# Patient Record
Sex: Female | Born: 1945
Health system: Southern US, Community
[De-identification: ages and names within clinical notes are randomized; demographics above are authoritative.]

## PROBLEM LIST (undated history)

## (undated) ENCOUNTER — Ambulatory Visit: Admission: EM | Payer: PPO | Source: Home / Self Care

## (undated) DIAGNOSIS — T7840XA Allergy, unspecified, initial encounter: Secondary | ICD-10-CM

## (undated) DIAGNOSIS — E7800 Pure hypercholesterolemia, unspecified: Secondary | ICD-10-CM

## (undated) DIAGNOSIS — F419 Anxiety disorder, unspecified: Secondary | ICD-10-CM

## (undated) DIAGNOSIS — J45909 Unspecified asthma, uncomplicated: Secondary | ICD-10-CM

## (undated) DIAGNOSIS — M1990 Unspecified osteoarthritis, unspecified site: Secondary | ICD-10-CM

## (undated) DIAGNOSIS — K219 Gastro-esophageal reflux disease without esophagitis: Secondary | ICD-10-CM

## (undated) DIAGNOSIS — F32A Depression, unspecified: Secondary | ICD-10-CM

## (undated) DIAGNOSIS — Z972 Presence of dental prosthetic device (complete) (partial): Secondary | ICD-10-CM

## (undated) DIAGNOSIS — Z8669 Personal history of other diseases of the nervous system and sense organs: Secondary | ICD-10-CM

## (undated) DIAGNOSIS — E119 Type 2 diabetes mellitus without complications: Secondary | ICD-10-CM

## (undated) DIAGNOSIS — R42 Dizziness and giddiness: Secondary | ICD-10-CM

## (undated) DIAGNOSIS — N6452 Nipple discharge: Secondary | ICD-10-CM

## (undated) DIAGNOSIS — F329 Major depressive disorder, single episode, unspecified: Secondary | ICD-10-CM

## (undated) DIAGNOSIS — N6019 Diffuse cystic mastopathy of unspecified breast: Secondary | ICD-10-CM

## (undated) DIAGNOSIS — E785 Hyperlipidemia, unspecified: Secondary | ICD-10-CM

## (undated) DIAGNOSIS — E559 Vitamin D deficiency, unspecified: Secondary | ICD-10-CM

## (undated) HISTORY — PX: TUBAL LIGATION: SHX77

## (undated) HISTORY — DX: Personal history of other diseases of the nervous system and sense organs: Z86.69

## (undated) HISTORY — DX: Type 2 diabetes mellitus without complications: E11.9

## (undated) HISTORY — DX: Unspecified osteoarthritis, unspecified site: M19.90

## (undated) HISTORY — DX: Depression, unspecified: F32.A

## (undated) HISTORY — DX: Nipple discharge: N64.52

## (undated) HISTORY — DX: Vitamin D deficiency, unspecified: E55.9

## (undated) HISTORY — DX: Gastro-esophageal reflux disease without esophagitis: K21.9

## (undated) HISTORY — PX: BREAST EXCISIONAL BIOPSY: SUR124

## (undated) HISTORY — PX: CYST EXCISION: SHX5701

## (undated) HISTORY — PX: TONSILLECTOMY: SUR1361

## (undated) HISTORY — DX: Hyperlipidemia, unspecified: E78.5

## (undated) HISTORY — DX: Unspecified asthma, uncomplicated: J45.909

## (undated) HISTORY — DX: Allergy, unspecified, initial encounter: T78.40XA

## (undated) HISTORY — DX: Pure hypercholesterolemia, unspecified: E78.00

## (undated) HISTORY — DX: Major depressive disorder, single episode, unspecified: F32.9

## (undated) HISTORY — DX: Anxiety disorder, unspecified: F41.9

## (undated) HISTORY — DX: Diffuse cystic mastopathy of unspecified breast: N60.19

## (undated) SURGERY — EXCISION OF BREAST LESION
Anesthesia: Choice | Laterality: Left

---

## 1993-09-15 HISTORY — PX: LIPOMA EXCISION: SHX5283

## 2004-12-09 ENCOUNTER — Ambulatory Visit: Payer: Self-pay | Admitting: Family Medicine

## 2009-09-15 HISTORY — PX: LYMPH GLAND EXCISION: SHX13

## 2009-09-15 HISTORY — PX: BREAST BIOPSY: SHX20

## 2010-06-20 ENCOUNTER — Ambulatory Visit: Payer: Self-pay | Admitting: Family Medicine

## 2010-07-03 ENCOUNTER — Ambulatory Visit: Payer: Self-pay | Admitting: Family Medicine

## 2010-07-29 ENCOUNTER — Ambulatory Visit: Payer: Self-pay | Admitting: Surgery

## 2011-03-05 ENCOUNTER — Ambulatory Visit: Payer: Self-pay | Admitting: Surgery

## 2012-06-01 ENCOUNTER — Ambulatory Visit: Payer: Self-pay | Admitting: Family Medicine

## 2012-06-14 ENCOUNTER — Ambulatory Visit: Payer: Self-pay | Admitting: Otolaryngology

## 2012-06-14 LAB — CREATININE, SERUM: EGFR (African American): >60

## 2012-07-12 ENCOUNTER — Ambulatory Visit: Payer: Self-pay | Admitting: Otolaryngology

## 2012-07-13 LAB — PATHOLOGY REPORT

## 2013-03-14 ENCOUNTER — Ambulatory Visit: Payer: Self-pay | Admitting: Family Medicine

## 2014-01-19 DIAGNOSIS — F419 Anxiety disorder, unspecified: Secondary | ICD-10-CM | POA: Insufficient documentation

## 2014-01-19 DIAGNOSIS — E559 Vitamin D deficiency, unspecified: Secondary | ICD-10-CM | POA: Insufficient documentation

## 2014-03-21 ENCOUNTER — Ambulatory Visit: Payer: Self-pay | Admitting: Emergency Medicine

## 2014-05-30 DIAGNOSIS — F32A Depression, unspecified: Secondary | ICD-10-CM | POA: Insufficient documentation

## 2014-05-30 DIAGNOSIS — F329 Major depressive disorder, single episode, unspecified: Secondary | ICD-10-CM | POA: Insufficient documentation

## 2015-01-02 NOTE — Op Note (Signed)
PATIENT NAME:  Diane Anderson, Diane Anderson MR#:  [id number] DATE OF BIRTH:  01-Dec-1945  DATE OF PROCEDURE:  07/12/2012  PREOPERATIVE DIAGNOSIS: Left parotid tumor.   POSTOPERATIVE DIAGNOSIS: Left parotid tumor.   OPERATIVE PROCEDURES:  1. Left superficial parotidectomy with excision of tumor.  2. Advancement flap closure of the defect for eliminating the dead space.   ANESTHESIA: General.   SURGEON: Huey Romans, MD   ASSISTANT: Beverly Gust, MD   COMPLICATIONS: None.   TOTAL ESTIMATED BLOOD LOSS: 25 mL.   PROCEDURE: The patient was given general anesthesia by oral endotracheal intubation. Once she was fully asleep, she was rotated to the right with shoulder roll for better visualization of the area. Facial nerve monitoring probe was placed around the left periorbital area and then in the left perioral area. This was used to continuously monitor the facial nerves. She was prepped and draped in a sterile fashion. Skin marking was created in the left preauricular crease, carried down around the earlobe and then posteriorly onto the neck into a neck crease. It was carried fairly far posteriorly because the mass was in the posterior portion of the parotid. You could feel the mass in the neck. 7.5 mL of 1% Xylocaine with epinephrine 1:100,000 were used for infiltration under the skin on the left side of the face and anterior neck. She was prepped and draped in a sterile fashion with a plastic drape over the face to be able to watch the facial muscles. The incision was created with elevation of a skin flap overlying the superficial parotid fascia. This brought the skin forward and posterior flap was created as well. The sternocleidomastoid muscles were evident and the tail of the parotid was freed up from the sternocleidomastoid muscle and this was tracked down inferiorly. You could see the posterior edge of the tumor and care was taken to make sure the capsule was all left intact. Once this was freed  up posteriorly, the parotid was pushed forward and dissection was carried down along the tragal cartilage to find the pointer. The tympanomastoid suture line was inferior to that and the nerve was found running inferiorly and fairly low from the tympanomastoid suture line. This was stimulated to make sure we could see good movement of the face and clearly identify the nerve. There was a branch that went superiorly and because of the location of the tumor dissection was not carried out superiorly around the nerve as this was unnecessary. Where it branched, the dissection was carried up to the parotid fascia and separating the superficial lobe of the lower half of the parotid gland. Dissection was carried along the inferior branches of the facial nerve as this ran deep to the tumor. The tumor was elevated and rotated superiorly. It was freed up from the digastric and then from the lower branches of the facial nerve. The remaining parotid fascia inferiorly and anterior to the lesion was then cut across to free this up here. There was a large retrofacial vein that was found. This was left intact and the lower branches of the facial nerve were coursing directly over the top of this. The entire mass was then finally freed up and removed for permanent section. This was the lower portion of the superficial lobe of the parotid gland along with the tumor. Wound was copiously irrigated. There was no significant bleeding here at all. It was extremely dry.   Advancement flap closure was then done by using 4-0 Mersilene to suture the parotid  fascia back to the fascia overlying the sternocleidomastoid. A 10 French TLS drain was then placed through a separate inferior stab incision and left deep in the wound. The Mersilene was then used to close over the rest of the defect. This advanced the skin overlying the parotid fascia and approximately 3/4 inch of skin was removed. The skin edges were then closed using 4-0 Vicryl  interrupted sutures and this closed the dermis along the length of the wound. Skin edges were held in apposition with a 6-0 nylon running locking suture. This advanced the skin backward and removed excessive skin to close the wound and prevent a defect in the area.   The patient tolerated the procedure well. She was awakened and taken to the recovery room in satisfactory condition. There were no operative complications. Total estimated blood loss was 25 mL.   ____________________________ Huey Romans, MD phj:drc D: 07/12/2012 11:12:29 ET T: 07/12/2012 11:27:47 ET JOB#: [id number]  cc: Huey Romans, MD, <Dictator> Huey Romans MD ELECTRONICALLY SIGNED 07/12/2012 18:22

## 2015-08-21 DIAGNOSIS — F3341 Major depressive disorder, recurrent, in partial remission: Secondary | ICD-10-CM | POA: Insufficient documentation

## 2015-08-21 DIAGNOSIS — E119 Type 2 diabetes mellitus without complications: Secondary | ICD-10-CM | POA: Insufficient documentation

## 2015-11-13 ENCOUNTER — Other Ambulatory Visit: Payer: Self-pay | Admitting: Family Medicine

## 2015-11-13 DIAGNOSIS — Z1231 Encounter for screening mammogram for malignant neoplasm of breast: Secondary | ICD-10-CM

## 2016-02-21 ENCOUNTER — Other Ambulatory Visit: Payer: Self-pay | Admitting: *Deleted

## 2016-02-21 ENCOUNTER — Inpatient Hospital Stay
Admission: RE | Admit: 2016-02-21 | Discharge: 2016-02-21 | Disposition: A | Discharge status: Home / Self Care | Payer: Self-pay | Source: Ambulatory Visit | Attending: *Deleted | Admitting: *Deleted

## 2016-02-21 DIAGNOSIS — Z1231 Encounter for screening mammogram for malignant neoplasm of breast: Secondary | ICD-10-CM

## 2016-03-06 ENCOUNTER — Other Ambulatory Visit: Payer: Self-pay | Admitting: Family Medicine

## 2016-03-06 ENCOUNTER — Ambulatory Visit
Admission: RE | Admit: 2016-03-06 | Discharge: 2016-03-06 | Disposition: A | Discharge status: Home / Self Care | Payer: Medicare Other | Source: Ambulatory Visit | Attending: Family Medicine | Admitting: Family Medicine

## 2016-03-06 DIAGNOSIS — Z1231 Encounter for screening mammogram for malignant neoplasm of breast: Secondary | ICD-10-CM

## 2016-03-13 ENCOUNTER — Other Ambulatory Visit: Payer: Self-pay

## 2016-03-13 ENCOUNTER — Ambulatory Visit (INDEPENDENT_AMBULATORY_CARE_PROVIDER_SITE_OTHER): Payer: Medicare Other | Admitting: Surgery

## 2016-03-13 ENCOUNTER — Encounter: Payer: Self-pay | Admitting: Surgery

## 2016-03-13 VITALS — BP 125/69 | HR 80 | Temp 98.1°F | Ht 63.0 in | Wt 181.0 lb

## 2016-03-13 DIAGNOSIS — K219 Gastro-esophageal reflux disease without esophagitis: Secondary | ICD-10-CM | POA: Insufficient documentation

## 2016-03-13 DIAGNOSIS — N6452 Nipple discharge: Secondary | ICD-10-CM | POA: Diagnosis not present

## 2016-03-13 DIAGNOSIS — E7800 Pure hypercholesterolemia, unspecified: Secondary | ICD-10-CM | POA: Insufficient documentation

## 2016-03-13 NOTE — Patient Instructions (Addendum)
Your appointment at [facility] will be on 03/27/2016 at 10:00 AM.   Your appointment with Dr. Azalee Course will be on 03/27/2016 at 2:00 PM at our [facility] office.

## 2016-03-17 NOTE — Progress Notes (Signed)
Subjective:     Patient ID: Diane Anderson, female   DOB: 20-Aug-1946, 70 y.o.   MRN: [id number]  HPI  70 yr old female who comes in today with complaint to left breast nipple discharge.  Unfortunately she is a poor historian and I cannot get an exact answer of the length of time that she's noticed it there somewhere on the timeframe of about a year. Patient has had previous drainage from the breast which patient states was the left breast and that she had some of the discharge sent for exam with my partner Dr. Felton Clinton. Patient also states that she had part of this area removed previously however when questioned further cannot remember if it's 2011 or 2014.  I believe she initially had drainage out of the right breast back in 2011 and had a biopsy of this area which showed fibrocystic changes. I believe that her left breast started draining in 2014 and she did not have a excision at that time but did have some of the drainage sent for cytology which was inconclusive and Dr. Marina Gravel believed was a dilated vein per his note.  Patient did seem to indicate that it had continued to drain occasionally since 2014.  She states that the initial time it was bloody and she thought that she had hit it doing outside yardwork the patient states that now it is a yellowish color. Patient states that the first time she noticed it was after taking a shower and lifting her breast up had a sticky substance on her hands. Patient denies any new lumps or bumps in the breast or in the axilla. She denies any pain or other symptoms associated.  She does not have a personal history of breast cancer and states maternal aunt with breast cancer as well, her mother had some skin cancers on her leg.    Past Medical History  Diagnosis Date  . Arthritis     left knee, left shoulder  . Hyperlipidemia   . GERD (gastroesophageal reflux disease)   . Anxiety   . Asthma   . Diabetes mellitus without complication [facility])    Past Surgical  History  Procedure Laterality Date  . Cyst excision      Dr. Pat Patrick  . Lipoma excision  1995    Right Shoulder  . Lymph gland excision Right 2011   Family History  Problem Relation Age of Onset  . Dementia Mother   . Cancer Mother     skin  . Alzheimer's disease Father   . Breast cancer Maternal Aunt 62  . Birth defects Cousin 18    Breast   Social History   Social History  . Marital Status: Divorced    Spouse Name: N/A  . Number of Children: N/A  . Years of Education: N/A   Social History Main Topics  . Smoking status: Current Some Day Smoker -- 1.00 packs/day for 52 years    Types: Cigarettes  . Smokeless tobacco: Never Used  . Alcohol Use: 0.0 oz/week    0 Standard drinks or equivalent per week     Comment: rare  . Drug Use: No  . Sexual Activity: Not Asked   Other Topics Concern  . None   Social History Narrative  . None    Current outpatient prescriptions:  .  albuterol (VENTOLIN HFA) 108 (90 Base) MCG/ACT inhaler, Inhale 2 Inhalers into the lungs every 4 (four) hours as needed., Disp: , Rfl:  .  augmented betamethasone dipropionate (  DIPROLENE-AF) 0.05 % cream, Apply 1 application topically 2 (two) times daily., Disp: , Rfl:  .  citalopram (CELEXA) 40 MG tablet, Take 1 tablet by mouth daily., Disp: , Rfl:  .  fluticasone (FLONASE) 50 MCG/ACT nasal spray, Place 2 sprays into the nose daily., Disp: , Rfl:  .  lisinopril (PRINIVIL,ZESTRIL) 2.5 MG tablet, Take 1 tablet by mouth daily., Disp: , Rfl:  .  omeprazole (PRILOSEC) 20 MG capsule, Take 1 capsule by mouth daily., Disp: , Rfl:  .  Vitamin D, Ergocalciferol, (DRISDOL) 50000 units CAPS capsule, Take 1 capsule by mouth once a week., Disp: , Rfl:  Allergies  Allergen Reactions  . Atorvastatin Other (See Comments)  . Penicillins Rash     Review of Systems  Constitutional: Negative for fever, activity change, appetite change and fatigue.  HENT: Negative for congestion and nosebleeds.   Respiratory: Negative  for apnea, cough, shortness of breath, wheezing and stridor.   Cardiovascular: Negative for chest pain, palpitations and leg swelling.  Gastrointestinal: Negative for vomiting, diarrhea and abdominal distention.  Genitourinary: Negative for urgency and hematuria.  Musculoskeletal: Negative for back pain and neck pain.  Skin: Negative for color change, pallor, rash and wound.  Neurological: Negative for dizziness and weakness.  Hematological: Negative for adenopathy. Does not bruise/bleed easily.  Psychiatric/Behavioral: Negative for agitation. The patient is not nervous/anxious.   All other systems reviewed and are negative.      Filed Vitals:   03/13/16 1536  BP: 125/69  Pulse: 80  Temp: 98.1 F (36.7 C)    Objective:   Physical Exam  Constitutional: She is oriented to person, place, and time. She appears well-developed and well-nourished. No distress.  HENT:  Head: Normocephalic and atraumatic.  Right Ear: External ear normal.  Left Ear: External ear normal.  Nose: Nose normal.  Mouth/Throat: Oropharynx is clear and moist. No oropharyngeal exudate.  Eyes: Conjunctivae and EOM are normal. Pupils are equal, round, and reactive to light. No scleral icterus.  Neck: Normal range of motion. Neck supple. No tracheal deviation present.  Cardiovascular: Normal rate, regular rhythm, normal heart sounds and intact distal pulses.  Exam reveals no gallop and no friction rub.   No murmur heard. Pulmonary/Chest: Effort normal and breath sounds normal. No respiratory distress. She has no wheezes. She has no rales.  Abdominal: Soft. Bowel sounds are normal. She exhibits no distension. There is no tenderness. There is no rebound.  Musculoskeletal: Normal range of motion. She exhibits no edema or tenderness.  Neurological: She is alert and oriented to person, place, and time. No cranial nerve deficit.  Skin: Skin is warm and dry. No rash noted. No erythema. No pallor.  Psychiatric: She has a  normal mood and affect. Her behavior is normal. Judgment and thought content normal.  Vitals reviewed. Breast exam: Right breast: well healed excision scar, no masses palpable, normal skin, no dimpling or skin retraction, no axillary or supraclavicular lymphadenopathy  Left breast: nipple appears normal but patient could squeeze hard and produce serous fluid, normal pressure to nipple and n/a complex did not product discharge, no skin changes, dimpling or skin retraction, small cylindrical area of tenderness beneath n/a complex about 1cm in size, soft, rubbery mobile, otherwise no masses, no axillary or supraclavicular lymphadenopathy     Assessment:     70yr old female with left breast nipple discharge    Plan:     I reviewed the patient's past medical history including her previous excision of the right breast  which is fibroadenomatous disease and previous cytology from the left breast which showed some hyperplastic ductal cells. Personally reviewed her last mammographic image which was in 2014 but have not seen any since that time. I discussed with the patient that nipple discharges most likely what we call a and are ductal papilloma which is a inflammation and overgrowth at the ducts but usually is not cancerous. I did discuss with her that we needed to give further imaging and she would need a mammogram ultrasound and likely a biopsy of this area before determining what further steps were needed. We did not have the equipment to perform a cytology specimen of discharge today, however this may be helpful in the future.  I will send her for bilateral mammogram since it has been over 2 years since she's had a screening, likely ultrasound and biopsy of the left breast as well. And I will have her return to my clinic once this is performed to discuss possible excision of this area and hopefully have more information to give her on the pathology at that time.

## 2016-03-27 ENCOUNTER — Other Ambulatory Visit: Payer: Self-pay | Admitting: Surgery

## 2016-03-27 ENCOUNTER — Ambulatory Visit
Admission: RE | Admit: 2016-03-27 | Discharge: 2016-03-27 | Disposition: A | Discharge status: Home / Self Care | Payer: Medicare Other | Source: Ambulatory Visit | Attending: Surgery | Admitting: Surgery

## 2016-03-27 ENCOUNTER — Ambulatory Visit: Payer: Medicare Other | Admitting: Surgery

## 2016-03-27 DIAGNOSIS — N6452 Nipple discharge: Secondary | ICD-10-CM | POA: Insufficient documentation

## 2016-03-27 DIAGNOSIS — N63 Unspecified lump in breast: Secondary | ICD-10-CM | POA: Insufficient documentation

## 2016-03-27 DIAGNOSIS — N6320 Unspecified lump in the left breast, unspecified quadrant: Secondary | ICD-10-CM

## 2016-04-02 ENCOUNTER — Telehealth: Payer: Self-pay

## 2016-04-02 DIAGNOSIS — N6320 Unspecified lump in the left breast, unspecified quadrant: Secondary | ICD-10-CM

## 2016-04-02 NOTE — Telephone Encounter (Signed)
Received Mammogram and Ultrasound results. This indicates that patient has 3 areas in Left Breast that need to be biopsied. Discussed this with Dr. Azalee Course at this time. Will obtain Breast Biopsy of Left Breast prior to seeing patient in clinic.  Order placed. Call made to [facility].

## 2016-04-02 NOTE — Telephone Encounter (Signed)
Patient has been scheduled for her biopsy on 04/18/16. Spoke with Aldona Bar in order to move patient's appointment up and she did inform me that the patient would not agree to come in any earlier than 04/18/16.  I confirmed this information with patient and patient's office appointment with Dr. Azalee Course has been rescheduled for 04/28/16 so that Biopsy results and pathology will be available when patient is seen.  Patient read back all appointment information and did not need directions to [facility] office.  Encouraged to call with any change in condition or with questions and concerns prior to appointment.

## 2016-04-07 ENCOUNTER — Ambulatory Visit: Payer: Self-pay | Admitting: Surgery

## 2016-04-18 ENCOUNTER — Ambulatory Visit
Admission: RE | Admit: 2016-04-18 | Discharge: 2016-04-18 | Disposition: A | Discharge status: Home / Self Care | Payer: Medicare Other | Source: Ambulatory Visit | Attending: Surgery | Admitting: Surgery

## 2016-04-18 DIAGNOSIS — R921 Mammographic calcification found on diagnostic imaging of breast: Secondary | ICD-10-CM | POA: Insufficient documentation

## 2016-04-18 DIAGNOSIS — N63 Unspecified lump in breast: Secondary | ICD-10-CM | POA: Insufficient documentation

## 2016-04-18 DIAGNOSIS — D242 Benign neoplasm of left breast: Secondary | ICD-10-CM | POA: Insufficient documentation

## 2016-04-18 DIAGNOSIS — N6320 Unspecified lump in the left breast, unspecified quadrant: Secondary | ICD-10-CM

## 2016-04-18 DIAGNOSIS — N6002 Solitary cyst of left breast: Secondary | ICD-10-CM | POA: Insufficient documentation

## 2016-04-18 DIAGNOSIS — N6042 Mammary duct ectasia of left breast: Secondary | ICD-10-CM | POA: Diagnosis not present

## 2016-04-18 HISTORY — PX: BREAST BIOPSY: SHX20

## 2016-04-21 LAB — SURGICAL PATHOLOGY

## 2016-04-28 ENCOUNTER — Encounter: Payer: Self-pay | Admitting: Surgery

## 2016-04-28 ENCOUNTER — Ambulatory Visit: Payer: Medicare Other | Admitting: Surgery

## 2016-04-28 ENCOUNTER — Ambulatory Visit: Payer: Self-pay | Admitting: Surgery

## 2016-04-28 ENCOUNTER — Ambulatory Visit (INDEPENDENT_AMBULATORY_CARE_PROVIDER_SITE_OTHER): Payer: Medicare Other | Admitting: Surgery

## 2016-04-28 DIAGNOSIS — D242 Benign neoplasm of left breast: Secondary | ICD-10-CM

## 2016-04-28 NOTE — Patient Instructions (Signed)
We have spoken today about removing a lump in your breast and sending this area for further pathology. We will have this done on 05/15/16 by Dr. Azalee Course at [facility].  You will most likely be able to leave the hospital several hours after your surgery.  Plan to tenatively be off work for 1-2 weeks following the surgery and may return with approximately 4 more weeks of a lifting restriction, no greater than 15 lbs.

## 2016-04-29 ENCOUNTER — Encounter: Payer: Self-pay | Admitting: Surgery

## 2016-04-29 DIAGNOSIS — D242 Benign neoplasm of left breast: Secondary | ICD-10-CM | POA: Insufficient documentation

## 2016-04-29 NOTE — Progress Notes (Signed)
HPI   70 year old female who was last seen about a month ago for a nipple discharge on her left breast. Patient had this for a very long time ongoing for 1-2 years. Patient had had a previous left biopsy performed in 1995 by Dr. Reatha Harps which was inconclusive.  Right breast excisional biopsy by Dr. Pat Patrick in 2006.  Dr. Marina Gravel suggest biopsy from [facility] on right breast in 2011 with fibrocystic changes and then saw her for the draining in July 2014 from the left breast.  She was to have short-term follow-up at that time after having some inconclusive results from the secretions however she did not return until about 2 months ago to have the another examination. Patient did have a small 1 cm nodule that was felt in the 10:00 position of the left breast along with some serous nipple discharge. Patient states that she hasn't had any further nipple discharge since being seen. Patient states that her breast had been painful back in July but is no longer painful. Patient denies any other changes to her breasts she denies any fever chills chest pain shortness of breath nausea vomiting diarrhea or dysuria.   Past Medical History:  Diagnosis Date  . Anxiety   . Arthritis    left knee, left shoulder  . Asthma   . Diabetes mellitus without complication ([place])   . GERD (gastroesophageal reflux disease)   . Hyperlipidemia    Past Surgical History:  Procedure Laterality Date  . BREAST BIOPSY Right   . BREAST BIOPSY Left 2005  . BREAST BIOPSY Left 04/18/2016   u/s core 3 areas path pending  . BREAST EXCISIONAL BIOPSY Right   . CYST EXCISION     Dr. Pat Patrick  . LIPOMA EXCISION  1995   Right Shoulder  . LYMPH GLAND EXCISION Right 2011   Family History  Problem Relation Age of Onset  . Dementia Mother   . Cancer Mother     skin  . Alzheimer's disease Father   . Breast cancer Maternal Aunt 72  . Birth defects Cousin 51    Breast  . Breast cancer Cousin   . Breast cancer Cousin    Social History   Social  History  . Marital status: Divorced    Spouse name: N/A  . Number of children: N/A  . Years of education: N/A   Social History Main Topics  . Smoking status: Current Every Day Smoker    Packs/day: 1.00    Years: 52.00    Types: Cigarettes  . Smokeless tobacco: Never Used  . Alcohol use 0.0 oz/week     Comment: rarely  . Drug use: No  . Sexual activity: Not Asked   Other Topics Concern  . None   Social History Narrative  . None    Current Outpatient Prescriptions:  .  albuterol (VENTOLIN HFA) 108 (90 Base) MCG/ACT inhaler, Inhale 2 Inhalers into the lungs every 4 (four) hours as needed., Disp: , Rfl:  .  citalopram (CELEXA) 40 MG tablet, Take 1 tablet by mouth daily., Disp: , Rfl:  .  fluticasone (FLONASE) 50 MCG/ACT nasal spray, Place 2 sprays into the nose daily., Disp: , Rfl:  .  lisinopril (PRINIVIL,ZESTRIL) 2.5 MG tablet, Take 1 tablet by mouth daily., Disp: , Rfl:  .  omeprazole (PRILOSEC) 20 MG capsule, Take 1 capsule by mouth daily., Disp: , Rfl:  .  Vitamin D, Ergocalciferol, (DRISDOL) 50000 units CAPS capsule, Take 50,000 Units by mouth every 7 (seven) days., Disp: ,  Rfl:  Allergies  Allergen Reactions  . Penicillins Rash  . Atorvastatin Other (See Comments)    Severe Sweating    Vitals:   04/28/16 1100  BP: (!) 159/76  Pulse: 79  Temp: 99 F (37.2 C)    Review of Systems  Constitutional: Negative for fever, activity change, appetite change and fatigue.  HENT: Negative for congestion and nosebleeds.   Respiratory: Negative for apnea, cough, shortness of breath, wheezing and stridor.   Cardiovascular: Negative for chest pain, palpitations and leg swelling.  Gastrointestinal: Negative for vomiting, diarrhea and abdominal distention.  Genitourinary: Negative for urgency and hematuria.  Musculoskeletal: Negative for back pain and neck pain.  Skin: Negative for color change, pallor, rash and wound.  Neurological: Negative for dizziness and weakness.   Hematological: Negative for adenopathy. Does not bruise/bleed easily.  Psychiatric/Behavioral: Negative for agitation. The patient is not nervous/anxious.   All other systems reviewed and are negative.   Physical Exam  Constitutional: She is oriented to person, place, and time. She appears well-developed and well-nourished. No distress.  HENT:  Head: Normocephalic and atraumatic.  Right Ear: External ear normal.  Left Ear: External ear normal.  Nose: Nose normal.  Mouth/Throat: Oropharynx is clear and moist. No oropharyngeal exudate.  Eyes: Conjunctivae and EOM are normal. Pupils are equal, round, and reactive to light. No scleral icterus.  Neck: Normal range of motion. Neck supple. No tracheal deviation present.  Cardiovascular: Normal rate, regular rhythm, normal heart sounds and intact distal pulses.  Exam reveals no gallop and no friction rub.   No murmur heard. Pulmonary/Chest: Effort normal and breath sounds normal. No respiratory distress. She has no wheezes. She has no rales.  Abdominal: Soft. Bowel sounds are normal. She exhibits no distension. There is no tenderness. There is no rebound.  Musculoskeletal: Normal range of motion. She exhibits no edema or tenderness.  Neurological: She is alert and oriented to person, place, and time. No cranial nerve deficit.  Skin: Skin is warm and dry. No rash noted. No erythema. No pallor.  Psychiatric: She has a normal mood and affect. Her behavior is normal. Judgment and thought content normal.  Vitals reviewed. Breast exam: Right breast: well healed excision scar, no masses palpable, normal skin, no dimpling or skin retraction, no axillary or supraclavicular lymphadenopathy  Left breast: nipple appears normal but patient could squeeze hard and produce serous fluid, normal pressure to nipple and n/a complex did not product discharge, no skin changes, dimpling or skin retraction, small cylindrical area of tenderness beneath n/a complex about  1cm in size, soft, rubbery mobile, otherwise no masses, no axillary or supraclavicular lymphadenopathy   Images:  SITE 1: LEFT BREAST MASS 3 O'CLOCK 2 CM FROM NIPPLE: Using sterile technique and 1% Lidocaine as local anesthetic, under direct ultrasound visualization, a 12 gauge spring-loaded device was used to perform biopsy of the mass in the left breast at 3 o'clock 2 cm from nipple using a lateral to medial approach. At the conclusion of the procedure a coil shaped tissue marker clip was deployed into the biopsy cavity.  SITE 2: LEFT BREAST MASS 3 O'CLOCK 5 CM FROM NIPPLE: Using steriletechnique and 1% Lidocaine as local anesthetic, under direct ultrasound visualization, a 12 gauge spring-loaded device was used to perform biopsy of the mass in the left breast at 3 o'clock 5 cm from the nipple using a lateral to medial approach. At the conclusion of the procedure a ribbon shaped tissue marker clip was deployed into the biopsy  cavity.  SITE 3: LEFT BREAST MASS 10 O'CLOCK 1 CM FROM THE NIPPLE: Using sterile technique and 1% Lidocaine as local anesthetic, under direct ultrasound visualization, a 12 gauge spring-loaded device was used to perform biopsy of the mass in the left breast at 10 o'clock 1 cm from the nipple using a lateral to medial approach. At the conclusion of the procedure a wing shaped tissue marker clip was deployed into the biopsy cavity.  Follow up 2 view mammogram was performed and dictated separately.  IMPRESSION: 1. Ultrasound-guided biopsy of left breast mass 3 o'clock 2 cm from nipple, at site of coil shaped biopsy marking clip.  2. Ultrasound-guided biopsy of left breast mass at 3 o'clock 5 cm from nipple, at site of ribbon shaped biopsy marking clip.  3. Ultrasound-guided biopsy of left breast mass at 10 o'clock 1 cm from the nipple, at site of wing shaped biopsy marking clip.  Biopsy results: left breast 3 o'clock 2 cm from the nipple: Benign breast tissue with  hyalinized stroma and cyst formation, calcifications associated with benign glands. This is concordant with the imaging findings.  - left breast 3 o'clock 5 cm from nipple: Benign breast tissue with duct ectasia, where calcifications associated with benign glands. This is concordant with the imaging findings.  - left breast 10 o'clock 1 cm from the nipple: Intraductal papilloma with calcifications. This is considered concordant with the imaging findings.  Assessment:     70 yr old female with left breast intraductal papilloma     Plan:     I have personally reviewed the patient's past medical history to include type 2 diabetes mellitus is well controlled some anxiety and clinical depression. I also personally reviewed her multiple breast biopsies better in her system. I have also personally reviewed her mammogram images as well as her ultrasound images and biopsy results. I've also reviewed the radiology reads on the above.    I discussed with the patient that she has intraductal papilloma which is usually the most common cause for nipple discharge. I discussed this is not a cancerous lesion is no atypia was seen however in about 10% of these cases can have some atypia that seen on the final pathology results. I discussed with the patient that if she did have a cancer on the final pathology results that she may need to have further resection of the area and sentinel lymph nodes to be biopsied but that was not necessary at this first operation. I discussed with the patient that usually go in and resect this area to ensure that there is not an area of chronic inflammation that could turn into cancer and to decrease the nipple discharge. I discussed the available options with the patient.  I discussed risks of bleeding, infection, damage to surrounding tissues, having positive margins, needing further resection, damage to nerves causing arm numbness or difficulty raising arm, causing lymphoedema in  the arm; as well as anesthesia risks of MI, stroke, prolonged ventilation, pulmonary embolism, thrombosis and even death. Patient was given the opportunity to ask questions and have them answered. They would like to proceed with left breast excisional biopsy. Since then mass can be palpated on physical exam will not need to do a needle localization. Will schedule her for excisional biopsy of the left breast in the next few weeks.

## 2016-04-29 NOTE — Progress Notes (Deleted)
Subjective:     Patient ID: Diane Anderson, female   DOB: 1946/07/26, 70 y.o.   MRN: [id number]  HPI    Review of Systems    Objective:   Physical Exam  Assessment:         Plan:

## 2016-05-01 ENCOUNTER — Telehealth: Payer: Self-pay

## 2016-05-01 NOTE — Telephone Encounter (Signed)
Patient has been scheduled for an Excisional Biopsy of Left Breast Mass on 05/15/16 by Dr. Azalee Course at [facility].  PAT  has been scheduled for 05/06/16 at 1pm. Office appointment.

## 2016-05-05 NOTE — Telephone Encounter (Signed)
Pt advised of pre op date/time and sx date. Sx: 05/15/16 with Dr Loflin--Excisional biopsy of left breast mass. Pre op: 05/06/16 @ 1:00pm--office.   Patient made aware to call [phone], between 1-3:00pm the day before surgery, to find out what time to arrive.

## 2016-05-06 ENCOUNTER — Encounter: Payer: Self-pay | Admitting: Surgery

## 2016-05-06 ENCOUNTER — Encounter
Admission: RE | Admit: 2016-05-06 | Discharge: 2016-05-06 | Disposition: A | Discharge status: Home / Self Care | Payer: Medicare Other | Source: Ambulatory Visit | Attending: Surgery | Admitting: Surgery

## 2016-05-06 DIAGNOSIS — Z01810 Encounter for preprocedural cardiovascular examination: Secondary | ICD-10-CM | POA: Insufficient documentation

## 2016-05-06 DIAGNOSIS — D242 Benign neoplasm of left breast: Secondary | ICD-10-CM | POA: Insufficient documentation

## 2016-05-06 DIAGNOSIS — Z01812 Encounter for preprocedural laboratory examination: Secondary | ICD-10-CM | POA: Diagnosis present

## 2016-05-06 LAB — CBC WITH DIFFERENTIAL/PLATELET
BASOS ABS: 0.1 10*3/uL (ref 0–0.1)
BASOS PCT: 1 %
EOS PCT: 2 %
Eosinophils Absolute: 0.2 10*3/uL (ref 0–0.7)
HEMATOCRIT: 42.4 % (ref 35.0–47.0)
Hemoglobin: 14.5 g/dL (ref 12.0–16.0)
LYMPHS PCT: 37 %
Lymphs Abs: 4.1 10*3/uL — ABNORMAL HIGH (ref 1.0–3.6)
MCH: 29.1 pg (ref 26.0–34.0)
MCHC: 34.2 g/dL (ref 32.0–36.0)
MCV: 85 fL (ref 80.0–100.0)
Monocytes Absolute: 0.8 10*3/uL (ref 0.2–0.9)
Monocytes Relative: 7 %
NEUTROS ABS: 6.1 10*3/uL (ref 1.4–6.5)
Neutrophils Relative %: 55 %
PLATELETS: 340 10*3/uL (ref 150–440)
RBC: 4.99 MIL/uL (ref 3.80–5.20)
RDW: 13.8 % (ref 11.5–14.5)
WBC: 11.2 10*3/uL — AB (ref 3.6–11.0)

## 2016-05-06 LAB — COMPREHENSIVE METABOLIC PANEL
ALBUMIN: 4 g/dL (ref 3.5–5.0)
ALT: 16 U/L (ref 14–54)
AST: 21 U/L (ref 15–41)
Alkaline Phosphatase: 71 U/L (ref 38–126)
Anion gap: 7 (ref 5–15)
BUN: 16 mg/dL (ref 6–20)
CHLORIDE: 100 mmol/L — AB (ref 101–111)
CO2: 25 mmol/L (ref 22–32)
CREATININE: 0.74 mg/dL (ref 0.44–1.00)
Calcium: 9.6 mg/dL (ref 8.9–10.3)
GFR calc Af Amer: >60 mL/min (ref >60)
GLUCOSE: 102 mg/dL — AB (ref 65–99)
Potassium: 3.8 mmol/L (ref 3.5–5.1)
Sodium: 132 mmol/L — ABNORMAL LOW (ref 135–145)
Total Bilirubin: <0.1 mg/dL — ABNORMAL LOW (ref 0.3–1.2)
Total Protein: 7 g/dL (ref 6.5–8.1)

## 2016-05-06 NOTE — Patient Instructions (Signed)
Your procedure is scheduled on: Thursday 05/15/16 Report to Day Surgery. 2ND FLOOR MEDICAL MALL ENTRANCE To find out your arrival time please call [phone] between 1PM - 3PM on Wednesday 05/14/16.  Remember: Instructions that are not followed completely may result in serious medical risk, up to and including death, or upon the discretion of your surgeon and anesthesiologist your surgery may need to be rescheduled.    __X__ 1. Do not eat food or drink liquids after midnight. No gum chewing or hard candies.     __X__ 2. No Alcohol for 24 hours before or after surgery.   ____ 3. Bring all medications with you on the day of surgery if instructed.    __X__ 4. Notify your doctor if there is any change in your medical condition     (cold, fever, infections).     Do not wear jewelry, make-up, hairpins, clips or nail polish.  Do not wear lotions, powders, or perfumes.   Do not shave 48 hours prior to surgery. Men may shave face and neck.  Do not bring valuables to the hospital.    [facility] is not responsible for any belongings or valuables.               Contacts, dentures or bridgework may not be worn into surgery.  Leave your suitcase in the car. After surgery it may be brought to your room.  For patients admitted to the hospital, discharge time is determined by your                treatment team.   Patients discharged the day of surgery will not be allowed to drive home.   Please read over the following fact sheets that you were given:   Surgical Site Infection Prevention   __X__ Take these medicines the morning of surgery with A SIP OF WATER:    1. OMEPRAZOLE  2.   3.   4.  5.  6.  ____ Fleet Enema (as directed)   __X__ Use CHG Soap as directed  ____ Use inhalers on the day of surgery  ____ Stop metformin 2 days prior to surgery    ____ Take 1/2 of usual insulin dose the night before surgery and none on the morning of surgery.   ____ Stop Coumadin/Plavix/aspirin on    ____ Stop Anti-inflammatories on    ____ Stop supplements until after surgery.    ____ Bring C-Pap to the hospital.

## 2016-05-13 NOTE — Telephone Encounter (Signed)
ERROR

## 2016-05-15 ENCOUNTER — Ambulatory Visit
Admission: RE | Admit: 2016-05-15 | Discharge: 2016-05-15 | Disposition: A | Discharge status: Home / Self Care | Payer: Medicare Other | Source: Ambulatory Visit | Attending: Surgery | Admitting: Surgery

## 2016-05-15 ENCOUNTER — Encounter: Payer: Self-pay | Admitting: *Deleted

## 2016-05-15 ENCOUNTER — Encounter
Admission: RE | Disposition: A | Discharge status: Home / Self Care | Payer: Self-pay | Source: Ambulatory Visit | Attending: Surgery

## 2016-05-15 ENCOUNTER — Ambulatory Visit: Payer: Medicare Other

## 2016-05-15 ENCOUNTER — Ambulatory Visit: Payer: Medicare Other | Admitting: Anesthesiology

## 2016-05-15 DIAGNOSIS — J45909 Unspecified asthma, uncomplicated: Secondary | ICD-10-CM | POA: Diagnosis not present

## 2016-05-15 DIAGNOSIS — N6452 Nipple discharge: Secondary | ICD-10-CM | POA: Insufficient documentation

## 2016-05-15 DIAGNOSIS — N6320 Unspecified lump in the left breast, unspecified quadrant: Secondary | ICD-10-CM

## 2016-05-15 DIAGNOSIS — E785 Hyperlipidemia, unspecified: Secondary | ICD-10-CM | POA: Diagnosis not present

## 2016-05-15 DIAGNOSIS — F17210 Nicotine dependence, cigarettes, uncomplicated: Secondary | ICD-10-CM | POA: Insufficient documentation

## 2016-05-15 DIAGNOSIS — E119 Type 2 diabetes mellitus without complications: Secondary | ICD-10-CM | POA: Insufficient documentation

## 2016-05-15 DIAGNOSIS — R921 Mammographic calcification found on diagnostic imaging of breast: Secondary | ICD-10-CM | POA: Insufficient documentation

## 2016-05-15 DIAGNOSIS — N63 Unspecified lump in breast: Secondary | ICD-10-CM

## 2016-05-15 DIAGNOSIS — Z880 Allergy status to penicillin: Secondary | ICD-10-CM | POA: Diagnosis not present

## 2016-05-15 DIAGNOSIS — K219 Gastro-esophageal reflux disease without esophagitis: Secondary | ICD-10-CM | POA: Insufficient documentation

## 2016-05-15 DIAGNOSIS — F419 Anxiety disorder, unspecified: Secondary | ICD-10-CM | POA: Insufficient documentation

## 2016-05-15 DIAGNOSIS — M1990 Unspecified osteoarthritis, unspecified site: Secondary | ICD-10-CM | POA: Insufficient documentation

## 2016-05-15 HISTORY — PX: EXCISION OF BREAST BIOPSY: SHX5822

## 2016-05-15 HISTORY — PX: BREAST EXCISIONAL BIOPSY: SUR124

## 2016-05-15 LAB — GLUCOSE, CAPILLARY
GLUCOSE-CAPILLARY: 105 mg/dL — AB (ref 65–99)
Glucose-Capillary: 113 mg/dL — ABNORMAL HIGH (ref 65–99)

## 2016-05-15 SURGERY — EXCISION OF BREAST BIOPSY
Anesthesia: General | Laterality: Left | Wound class: Clean

## 2016-05-15 MED ORDER — BUPIVACAINE-EPINEPHRINE (PF) 0.5% -1:200000 IJ SOLN
INTRAMUSCULAR | Status: AC
  Filled 2016-05-15: qty 30

## 2016-05-15 MED ORDER — CIPROFLOXACIN IN D5W 400 MG/200ML IV SOLN
INTRAVENOUS | Status: AC
  Administered 2016-05-15: 400 mg via INTRAVENOUS
  Filled 2016-05-15: qty 200

## 2016-05-15 MED ORDER — OXYCODONE HCL 5 MG/5ML PO SOLN: 5.0000 mg | Freq: Once | ORAL | Status: DC | PRN

## 2016-05-15 MED ORDER — FENTANYL CITRATE (PF) 100 MCG/2ML IJ SOLN
25.0000 ug | INTRAMUSCULAR | Status: DC | PRN
  Administered 2016-05-15 (×2): 25 ug via INTRAVENOUS

## 2016-05-15 MED ORDER — SODIUM CHLORIDE 0.9 % IV SOLN
INTRAVENOUS | Status: DC
  Administered 2016-05-15: 07:00:00 via INTRAVENOUS

## 2016-05-15 MED ORDER — CIPROFLOXACIN IN D5W 400 MG/200ML IV SOLN
400.0000 mg | INTRAVENOUS | Status: AC
  Administered 2016-05-15: 400 mg via INTRAVENOUS

## 2016-05-15 MED ORDER — GLYCOPYRROLATE 0.2 MG/ML IJ SOLN
INTRAMUSCULAR | Status: DC | PRN
  Administered 2016-05-15: 0.2 mg via INTRAVENOUS

## 2016-05-15 MED ORDER — MIDAZOLAM HCL 2 MG/2ML IJ SOLN
INTRAMUSCULAR | Status: DC | PRN
  Administered 2016-05-15: 1 mg via INTRAVENOUS

## 2016-05-15 MED ORDER — CHLORHEXIDINE GLUCONATE CLOTH 2 % EX PADS: 6.0000 | MEDICATED_PAD | Freq: Once | CUTANEOUS | Status: DC

## 2016-05-15 MED ORDER — LIDOCAINE HCL (CARDIAC) 20 MG/ML IV SOLN
INTRAVENOUS | Status: DC | PRN
  Administered 2016-05-15: 100 mg via INTRAVENOUS

## 2016-05-15 MED ORDER — PROMETHAZINE HCL 25 MG/ML IJ SOLN: 6.2500 mg | INTRAMUSCULAR | Status: DC | PRN

## 2016-05-15 MED ORDER — FENTANYL CITRATE (PF) 100 MCG/2ML IJ SOLN
INTRAMUSCULAR | Status: DC | PRN
  Administered 2016-05-15: 100 ug via INTRAVENOUS
  Administered 2016-05-15: 25 ug via INTRAVENOUS

## 2016-05-15 MED ORDER — FENTANYL CITRATE (PF) 100 MCG/2ML IJ SOLN
INTRAMUSCULAR | Status: AC
  Administered 2016-05-15: 25 ug via INTRAVENOUS
  Filled 2016-05-15: qty 2

## 2016-05-15 MED ORDER — PHENYLEPHRINE HCL 10 MG/ML IJ SOLN
INTRAMUSCULAR | Status: DC | PRN
  Administered 2016-05-15: 25 ug/min via INTRAVENOUS

## 2016-05-15 MED ORDER — KETOROLAC TROMETHAMINE 30 MG/ML IJ SOLN
INTRAMUSCULAR | Status: DC | PRN
  Administered 2016-05-15: 30 mg via INTRAVENOUS

## 2016-05-15 MED ORDER — PROPOFOL 10 MG/ML IV BOLUS
INTRAVENOUS | Status: DC | PRN
  Administered 2016-05-15: 150 mg via INTRAVENOUS

## 2016-05-15 MED ORDER — PHENYLEPHRINE HCL 10 MG/ML IJ SOLN
INTRAMUSCULAR | Status: DC | PRN
  Administered 2016-05-15 (×3): 200 ug via INTRAVENOUS

## 2016-05-15 MED ORDER — OXYCODONE HCL 5 MG PO TABS: 5.0000 mg | ORAL_TABLET | Freq: Once | ORAL | Status: DC | PRN

## 2016-05-15 MED ORDER — KETAMINE HCL 50 MG/ML IJ SOLN
INTRAMUSCULAR | Status: DC | PRN
  Administered 2016-05-15: 50 mg via INTRAVENOUS

## 2016-05-15 MED ORDER — DEXAMETHASONE SODIUM PHOSPHATE 4 MG/ML IJ SOLN
INTRAMUSCULAR | Status: DC | PRN
  Administered 2016-05-15: 5 mg via INTRAVENOUS

## 2016-05-15 MED ORDER — MEPERIDINE HCL 25 MG/ML IJ SOLN: 6.2500 mg | INTRAMUSCULAR | Status: DC | PRN

## 2016-05-15 MED ORDER — BUPIVACAINE-EPINEPHRINE (PF) 0.5% -1:200000 IJ SOLN
INTRAMUSCULAR | Status: DC | PRN
  Administered 2016-05-15: 16 mL

## 2016-05-15 MED ORDER — TRAMADOL HCL 50 MG PO TABS: 100.0000 mg | ORAL_TABLET | Freq: Four times a day (QID) | ORAL | 0 refills | Status: DC | PRN

## 2016-05-15 MED ORDER — IBUPROFEN 800 MG PO TABS: 800.0000 mg | ORAL_TABLET | Freq: Three times a day (TID) | ORAL | 0 refills | Status: DC | PRN

## 2016-05-15 MED ORDER — ONDANSETRON HCL 4 MG/2ML IJ SOLN
INTRAMUSCULAR | Status: DC | PRN
  Administered 2016-05-15: 4 mg via INTRAVENOUS

## 2016-05-15 SURGICAL SUPPLY — 31 items
BLADE SURG 15 STRL LF DISP TIS (BLADE) ×1 IMPLANT
BLADE SURG 15 STRL SS (BLADE) ×2
CANISTER SUCT 1200ML W/VALVE (MISCELLANEOUS) ×3 IMPLANT
CHLORAPREP W/TINT 26ML (MISCELLANEOUS) ×3 IMPLANT
CNTNR SPEC 2.5X3XGRAD LEK (MISCELLANEOUS) ×1
CONT SPEC 4OZ STER OR WHT (MISCELLANEOUS) ×2
CONTAINER SPEC 2.5X3XGRAD LEK (MISCELLANEOUS) ×1 IMPLANT
DEVICE DUBIN SPECIMEN MAMMOGRA (MISCELLANEOUS) ×6 IMPLANT
DRAPE LAPAROTOMY TRNSV 106X77 (MISCELLANEOUS) ×3 IMPLANT
ELECT CAUTERY BLADE 6.4 (BLADE) ×3 IMPLANT
ELECT REM PT RETURN 9FT ADLT (ELECTROSURGICAL) ×3
ELECTRODE REM PT RTRN 9FT ADLT (ELECTROSURGICAL) ×1 IMPLANT
GLOVE BIO SURGEON STRL SZ 6.5 (GLOVE) ×4 IMPLANT
GLOVE BIO SURGEONS STRL SZ 6.5 (GLOVE) ×2
GLOVE BIOGEL PI IND STRL 7.0 (GLOVE) ×1 IMPLANT
GLOVE BIOGEL PI INDICATOR 7.0 (GLOVE) ×2
GLOVE PI ORTHOPRO 6.5 (GLOVE) ×2
GLOVE PI ORTHOPRO STRL 6.5 (GLOVE) ×1 IMPLANT
GOWN STRL REUS W/ TWL LRG LVL3 (GOWN DISPOSABLE) ×2 IMPLANT
GOWN STRL REUS W/TWL LRG LVL3 (GOWN DISPOSABLE) ×4
LIQUID BAND (GAUZE/BANDAGES/DRESSINGS) ×3 IMPLANT
NEEDLE HYPO 25X1 1.5 SAFETY (NEEDLE) ×3 IMPLANT
PACK BASIN MINOR ARMC (MISCELLANEOUS) ×3 IMPLANT
SURGI-BRA LG (MISCELLANEOUS) IMPLANT
SUT MNCRL 3-0 UNDYED SH (SUTURE) ×2 IMPLANT
SUT MNCRL 4-0 (SUTURE) ×2
SUT MNCRL 4-0 27XMFL (SUTURE) ×1
SUT MONOCRYL 3-0 UNDYED (SUTURE) ×4
SUT SILK 2 0 SH (SUTURE) ×6 IMPLANT
SUTURE MNCRL 4-0 27XMF (SUTURE) ×1 IMPLANT
SYRINGE 10CC LL (SYRINGE) ×3 IMPLANT

## 2016-05-15 NOTE — Interval H&P Note (Signed)
History and Physical Interval Note:  05/15/2016 7:25 AM  Diane Anderson  has presented today for surgery, with the diagnosis of LEFT BREAST MASS  The various methods of treatment have been discussed with the patient and family. After consideration of risks, benefits and other options for treatment, the patient has consented to  Procedure(s): EXCISION OF BREAST BIOPSY (Left) as a surgical intervention .  The patient's history has been reviewed, patient examined, no change in status, stable for surgery.  I have reviewed the patient's chart and labs.  Questions were answered to the patient's satisfaction.     Nargis Abrams L [place]

## 2016-05-15 NOTE — Anesthesia Preprocedure Evaluation (Signed)
Anesthesia Evaluation  Patient identified by MRN, date of birth, ID band Patient awake    Reviewed: Allergy & Precautions, NPO status , Patient's Chart, lab work & pertinent test results  History of Anesthesia Complications Negative for: history of anesthetic complications  Airway Mallampati: III  TM Distance: >3 FB   Mouth opening: Limited Mouth Opening  Dental  (+) Poor Dentition   Pulmonary asthma , Current Smoker,    breath sounds clear to auscultation- rhonchi (-) wheezing      Cardiovascular Exercise Tolerance: Good (-) hypertension(-) CAD and (-) Past MI  Rhythm:Regular Rate:Normal - Systolic murmurs and - Diastolic murmurs    Neuro/Psych Anxiety Depression negative neurological ROS     GI/Hepatic Neg liver ROS, GERD  ,  Endo/Other  diabetes (diet controllled, on lisinopril for renal protection), Type 2  Renal/GU negative Renal ROS     Musculoskeletal  (+) Arthritis ,   Abdominal (+) + obese,   Peds  Hematology negative hematology ROS (+)   Anesthesia Other Findings Past Medical History: No date: Anxiety No date: Arthritis     Comment: left knee, left shoulder No date: Asthma No date: Diabetes mellitus without complication ([facility]) No date: GERD (gastroesophageal reflux disease) No date: Hyperlipidemia   Reproductive/Obstetrics                             Anesthesia Physical Anesthesia Plan  ASA: III  Anesthesia Plan: General   Post-op Pain Management:    Induction: Intravenous  Airway Management Planned: LMA  Additional Equipment:   Intra-op Plan:   Post-operative Plan:   Informed Consent: I have reviewed the patients History and Physical, chart, labs and discussed the procedure including the risks, benefits and alternatives for the proposed anesthesia with the patient or authorized representative who has indicated his/her understanding and acceptance.   Dental  advisory given  Plan Discussed with: CRNA and Anesthesiologist  Anesthesia Plan Comments:         Anesthesia Quick Evaluation

## 2016-05-15 NOTE — Transfer of Care (Signed)
Immediate Anesthesia Transfer of Care Note  Patient: Diane Anderson  Procedure(s) Performed: Procedure(s): EXCISION OF BREAST BIOPSY (Left)  Patient Location: PACU  Anesthesia Type:General  Level of Consciousness: awake, alert  and oriented  Airway & Oxygen Therapy: Patient connected to nasal cannula oxygen  Post-op Assessment: Post -op Vital signs reviewed and stable  Post vital signs: stable  Last Vitals:  Vitals:   05/15/16 0617 05/15/16 0915  BP: 135/60 (!) 144/71  Pulse: 81 86  Resp: 14 11  Temp: 36.8 C 36.2 C    Last Pain:  Vitals:   05/15/16 0617  TempSrc: Oral  PainSc: 4          Complications: No apparent anesthesia complications

## 2016-05-15 NOTE — Op Note (Signed)
Breast Lumpectomy Procedure Note  Indications: This patient presents with history of  left nipple discharge and biopsy of papilloma.   Pre-operative Diagnosis: left nipple discharge  Post-operative Diagnosis: left  nipple discharge,intraductal papilloma  Surgeon: Hubbard Robinson   Assistants: none  Anesthesia: General endotracheal anesthesia  ASA Class: 2  Procedure Details  The patient was seen in the Holding Room. The risks, benefits, complications, treatment options, and expected outcomes were discussed with the patient. The possibilities of reaction to medication, pulmonary aspiration, bleeding, infection, the need for additional procedures, failure to diagnose a condition, and creating a complication requiring transfusion or operation were discussed with the patient. The patient concurred with the proposed plan, giving informed consent.  The site of surgery properly noted/marked. The patient was taken to the operating room,  identified as Diane Anderson and the procedure verified as Breast Ductoscopy and Exploration. A Time Out was held and the above information confirmed.  The patient was placed supine.  After establishing intravenous sedation, the left breast was prepped and draped in standard fashion.  A lacrimal probe was easily introduced into a ductal orifice emitting clear secretion in the 10 o'clock position.  A 15 blade scalpel was used to make a curvilinear incision along the areolar complex.  The nipple-areolar complex was mobilized anteriorly.  A probe was placed into the ductal orifice and the ductal structure was mobilized from the surrounding subareolar tissue, excised, marked and submitted to radiology. The first film did not have the clip in place. Additional tissue was removed in the 11-12 oclock position.  This tissue was marked and submitted to radiology.  The clip was present in this tissue.  Both specimens were submitted to pathology.  Hemostasis was achieved  with cautery.  Closure was performed with a 4-0 Monocryl subcuticular closure in layers.  Sterile glue was applied.  At the end of the operation, all sponge, instrument and needle counts were correct.  Findings: Duct draining in 10 oclock position, area removed, additional tissue from 11-12 oclock position with clip in place  Estimated Blood Loss:  Minimal         Drains: none         Total IV Fluids: 374ml         Specimens: left breast specimen, and left breast specimen 11-12 oclock position         Implants: none         Complications:  None; patient tolerated the procedure well.         Disposition: PACU - hemodynamically stable.         Condition: stable

## 2016-05-15 NOTE — Anesthesia Postprocedure Evaluation (Signed)
Anesthesia Post Note  Patient: Diane Anderson  Procedure(s) Performed: Procedure(s) (LRB): EXCISION OF BREAST BIOPSY (Left)  Patient location during evaluation: PACU Anesthesia Type: General Level of consciousness: awake and alert and oriented Pain management: pain level controlled Vital Signs Assessment: post-procedure vital signs reviewed and stable Respiratory status: spontaneous breathing, nonlabored ventilation and respiratory function stable Cardiovascular status: blood pressure returned to baseline and stable Postop Assessment: no signs of nausea or vomiting Anesthetic complications: no    Last Vitals:  Vitals:   05/15/16 1000 05/15/16 1015  BP: (!) 109/58 (!) 104/55  Pulse: 67 71  Resp: 15 18  Temp: 36.4 C     Last Pain:  Vitals:   05/15/16 1015  TempSrc:   PainSc: 2                  Kylee Umana

## 2016-05-15 NOTE — Discharge Instructions (Signed)

## 2016-05-15 NOTE — H&P (View-Only) (Signed)
HPI   70 year old female who was last seen about a month ago for a nipple discharge on her left breast. Patient had this for a very long time ongoing for 1-2 years. Patient had had a previous left biopsy performed in 1995 by Dr. Reatha Harps which was inconclusive.  Right breast excisional biopsy by Dr. Pat Patrick in 2006.  Dr. Marina Gravel suggest biopsy from [facility] on right breast in 2011 with fibrocystic changes and then saw her for the draining in July 2014 from the left breast.  She was to have short-term follow-up at that time after having some inconclusive results from the secretions however she did not return until about 2 months ago to have the another examination. Patient did have a small 1 cm nodule that was felt in the 10:00 position of the left breast along with some serous nipple discharge. Patient states that she hasn't had any further nipple discharge since being seen. Patient states that her breast had been painful back in July but is no longer painful. Patient denies any other changes to her breasts she denies any fever chills chest pain shortness of breath nausea vomiting diarrhea or dysuria.   Past Medical History:  Diagnosis Date  . Anxiety   . Arthritis    left knee, left shoulder  . Asthma   . Diabetes mellitus without complication ([place])   . GERD (gastroesophageal reflux disease)   . Hyperlipidemia    Past Surgical History:  Procedure Laterality Date  . BREAST BIOPSY Right   . BREAST BIOPSY Left 2005  . BREAST BIOPSY Left 04/18/2016   u/s core 3 areas path pending  . BREAST EXCISIONAL BIOPSY Right   . CYST EXCISION     Dr. Pat Patrick  . LIPOMA EXCISION  1995   Right Shoulder  . LYMPH GLAND EXCISION Right 2011   Family History  Problem Relation Age of Onset  . Dementia Mother   . Cancer Mother     skin  . Alzheimer's disease Father   . Breast cancer Maternal Aunt 29  . Birth defects Cousin 110    Breast  . Breast cancer Cousin   . Breast cancer Cousin    Social History   Social  History  . Marital status: Divorced    Spouse name: N/A  . Number of children: N/A  . Years of education: N/A   Social History Main Topics  . Smoking status: Current Every Day Smoker    Packs/day: 1.00    Years: 52.00    Types: Cigarettes  . Smokeless tobacco: Never Used  . Alcohol use 0.0 oz/week     Comment: rarely  . Drug use: No  . Sexual activity: Not Asked   Other Topics Concern  . None   Social History Narrative  . None    Current Outpatient Prescriptions:  .  albuterol (VENTOLIN HFA) 108 (90 Base) MCG/ACT inhaler, Inhale 2 Inhalers into the lungs every 4 (four) hours as needed., Disp: , Rfl:  .  citalopram (CELEXA) 40 MG tablet, Take 1 tablet by mouth daily., Disp: , Rfl:  .  fluticasone (FLONASE) 50 MCG/ACT nasal spray, Place 2 sprays into the nose daily., Disp: , Rfl:  .  lisinopril (PRINIVIL,ZESTRIL) 2.5 MG tablet, Take 1 tablet by mouth daily., Disp: , Rfl:  .  omeprazole (PRILOSEC) 20 MG capsule, Take 1 capsule by mouth daily., Disp: , Rfl:  .  Vitamin D, Ergocalciferol, (DRISDOL) 50000 units CAPS capsule, Take 50,000 Units by mouth every 7 (seven) days., Disp: ,  Rfl:  Allergies  Allergen Reactions  . Penicillins Rash  . Atorvastatin Other (See Comments)    Severe Sweating    Vitals:   04/28/16 1100  BP: (!) 159/76  Pulse: 79  Temp: 99 F (37.2 C)    Review of Systems  Constitutional: Negative for fever, activity change, appetite change and fatigue.  HENT: Negative for congestion and nosebleeds.   Respiratory: Negative for apnea, cough, shortness of breath, wheezing and stridor.   Cardiovascular: Negative for chest pain, palpitations and leg swelling.  Gastrointestinal: Negative for vomiting, diarrhea and abdominal distention.  Genitourinary: Negative for urgency and hematuria.  Musculoskeletal: Negative for back pain and neck pain.  Skin: Negative for color change, pallor, rash and wound.  Neurological: Negative for dizziness and weakness.   Hematological: Negative for adenopathy. Does not bruise/bleed easily.  Psychiatric/Behavioral: Negative for agitation. The patient is not nervous/anxious.   All other systems reviewed and are negative.   Physical Exam  Constitutional: She is oriented to person, place, and time. She appears well-developed and well-nourished. No distress.  HENT:  Head: Normocephalic and atraumatic.  Right Ear: External ear normal.  Left Ear: External ear normal.  Nose: Nose normal.  Mouth/Throat: Oropharynx is clear and moist. No oropharyngeal exudate.  Eyes: Conjunctivae and EOM are normal. Pupils are equal, round, and reactive to light. No scleral icterus.  Neck: Normal range of motion. Neck supple. No tracheal deviation present.  Cardiovascular: Normal rate, regular rhythm, normal heart sounds and intact distal pulses.  Exam reveals no gallop and no friction rub.   No murmur heard. Pulmonary/Chest: Effort normal and breath sounds normal. No respiratory distress. She has no wheezes. She has no rales.  Abdominal: Soft. Bowel sounds are normal. She exhibits no distension. There is no tenderness. There is no rebound.  Musculoskeletal: Normal range of motion. She exhibits no edema or tenderness.  Neurological: She is alert and oriented to person, place, and time. No cranial nerve deficit.  Skin: Skin is warm and dry. No rash noted. No erythema. No pallor.  Psychiatric: She has a normal mood and affect. Her behavior is normal. Judgment and thought content normal.  Vitals reviewed. Breast exam: Right breast: well healed excision scar, no masses palpable, normal skin, no dimpling or skin retraction, no axillary or supraclavicular lymphadenopathy  Left breast: nipple appears normal but patient could squeeze hard and produce serous fluid, normal pressure to nipple and n/a complex did not product discharge, no skin changes, dimpling or skin retraction, small cylindrical area of tenderness beneath n/a complex about  1cm in size, soft, rubbery mobile, otherwise no masses, no axillary or supraclavicular lymphadenopathy   Images:  SITE 1: LEFT BREAST MASS 3 O'CLOCK 2 CM FROM NIPPLE: Using sterile technique and 1% Lidocaine as local anesthetic, under direct ultrasound visualization, a 12 gauge spring-loaded device was used to perform biopsy of the mass in the left breast at 3 o'clock 2 cm from nipple using a lateral to medial approach. At the conclusion of the procedure a coil shaped tissue marker clip was deployed into the biopsy cavity.  SITE 2: LEFT BREAST MASS 3 O'CLOCK 5 CM FROM NIPPLE: Using steriletechnique and 1% Lidocaine as local anesthetic, under direct ultrasound visualization, a 12 gauge spring-loaded device was used to perform biopsy of the mass in the left breast at 3 o'clock 5 cm from the nipple using a lateral to medial approach. At the conclusion of the procedure a ribbon shaped tissue marker clip was deployed into the biopsy  cavity.  SITE 3: LEFT BREAST MASS 10 O'CLOCK 1 CM FROM THE NIPPLE: Using sterile technique and 1% Lidocaine as local anesthetic, under direct ultrasound visualization, a 12 gauge spring-loaded device was used to perform biopsy of the mass in the left breast at 10 o'clock 1 cm from the nipple using a lateral to medial approach. At the conclusion of the procedure a wing shaped tissue marker clip was deployed into the biopsy cavity.  Follow up 2 view mammogram was performed and dictated separately.  IMPRESSION: 1. Ultrasound-guided biopsy of left breast mass 3 o'clock 2 cm from nipple, at site of coil shaped biopsy marking clip.  2. Ultrasound-guided biopsy of left breast mass at 3 o'clock 5 cm from nipple, at site of ribbon shaped biopsy marking clip.  3. Ultrasound-guided biopsy of left breast mass at 10 o'clock 1 cm from the nipple, at site of wing shaped biopsy marking clip.  Biopsy results: left breast 3 o'clock 2 cm from the nipple: Benign breast tissue with  hyalinized stroma and cyst formation, calcifications associated with benign glands. This is concordant with the imaging findings.  - left breast 3 o'clock 5 cm from nipple: Benign breast tissue with duct ectasia, where calcifications associated with benign glands. This is concordant with the imaging findings.  - left breast 10 o'clock 1 cm from the nipple: Intraductal papilloma with calcifications. This is considered concordant with the imaging findings.  Assessment:     70 yr old female with left breast intraductal papilloma     Plan:     I have personally reviewed the patient's past medical history to include type 2 diabetes mellitus is well controlled some anxiety and clinical depression. I also personally reviewed her multiple breast biopsies better in her system. I have also personally reviewed her mammogram images as well as her ultrasound images and biopsy results. I've also reviewed the radiology reads on the above.    I discussed with the patient that she has intraductal papilloma which is usually the most common cause for nipple discharge. I discussed this is not a cancerous lesion is no atypia was seen however in about 10% of these cases can have some atypia that seen on the final pathology results. I discussed with the patient that if she did have a cancer on the final pathology results that she may need to have further resection of the area and sentinel lymph nodes to be biopsied but that was not necessary at this first operation. I discussed with the patient that usually go in and resect this area to ensure that there is not an area of chronic inflammation that could turn into cancer and to decrease the nipple discharge. I discussed the available options with the patient.  I discussed risks of bleeding, infection, damage to surrounding tissues, having positive margins, needing further resection, damage to nerves causing arm numbness or difficulty raising arm, causing lymphoedema in  the arm; as well as anesthesia risks of MI, stroke, prolonged ventilation, pulmonary embolism, thrombosis and even death. Patient was given the opportunity to ask questions and have them answered. They would like to proceed with left breast excisional biopsy. Since then mass can be palpated on physical exam will not need to do a needle localization. Will schedule her for excisional biopsy of the left breast in the next few weeks.

## 2016-05-15 NOTE — Anesthesia Procedure Notes (Signed)
Procedure Name: LMA Insertion Performed by: Rosaria Ferries, Ariyona Eid Pre-anesthesia Checklist: Patient identified, Emergency Drugs available, Suction available and Patient being monitored Patient Re-evaluated:Patient Re-evaluated prior to inductionOxygen Delivery Method: Circle system utilized Preoxygenation: Pre-oxygenation with 100% oxygen Intubation Type: IV induction LMA: LMA inserted LMA Size: 4.0 Number of attempts: 1 Placement Confirmation: breath sounds checked- equal and bilateral Dental Injury: Teeth and Oropharynx as per pre-operative assessment

## 2016-05-16 LAB — SURGICAL PATHOLOGY

## 2016-05-20 ENCOUNTER — Telehealth: Payer: Self-pay | Admitting: Surgery

## 2016-05-20 NOTE — Telephone Encounter (Signed)
Patient had excision of breast biopsy 8/31 and would like to know what she can and can't do. Please call

## 2016-05-20 NOTE — Telephone Encounter (Signed)
Spoke with patient at this time. Patient denies any fever, bright redness around the incision and no drainage. She stated she is still very sore. She was advised to not lift anything greater than 15 lbs for up to six weeks from the date of surgery. She has a follow up appointment on 06/04/16 with Dr.Loflin. She also asked about receiving FMLA paperwork and was provided [place] office number to call and ask [place]. Patient verbalized understanding.

## 2016-05-21 ENCOUNTER — Telehealth: Payer: Self-pay

## 2016-05-21 NOTE — Telephone Encounter (Signed)
Disability Form was filled out and faxed.

## 2016-05-27 ENCOUNTER — Telehealth: Payer: Self-pay | Admitting: Surgery

## 2016-05-27 NOTE — Telephone Encounter (Signed)
Spoke to patient and told her that they would go over her results at her post op appointment.

## 2016-05-27 NOTE — Telephone Encounter (Signed)
Results from biopsy

## 2016-05-27 NOTE — Telephone Encounter (Signed)
I do not see a final report on her biopsy. Her preliminary results are in and do not look worrisome at this time but report has not been finalized.  We will see patient in post-op as scheduled next week.

## 2016-05-27 NOTE — Telephone Encounter (Signed)
Left voice message for patient to call the office  

## 2016-06-04 ENCOUNTER — Ambulatory Visit (INDEPENDENT_AMBULATORY_CARE_PROVIDER_SITE_OTHER): Payer: Medicare Other | Admitting: Surgery

## 2016-06-04 ENCOUNTER — Encounter: Payer: Self-pay | Admitting: Surgery

## 2016-06-04 VITALS — BP 142/70 | HR 81 | Temp 98.3°F | Wt 182.0 lb

## 2016-06-04 DIAGNOSIS — D242 Benign neoplasm of left breast: Secondary | ICD-10-CM

## 2016-06-04 NOTE — Progress Notes (Signed)
70 year old female with a left breast papilloma which was excised on 8/31. Patient has been doing well she continues to have some pain in her left breast and bruising. Patient states that she did have some nausea couple times but has been doing well since then no family no fever chills. Patient states that her appetite is fair and that she's been having good bowel movements. Patient has had no drainage from her incision sites.  Vitals:   06/04/16 1033  BP: (!) 142/70  Pulse: 81  Temp: 98.3 F (36.8 C)   PE:  Gen: NAD Left breast: incision c/d/i, some bruising to superior medial aspect, mild edema, no fluctuance or erythema   A/P:  Patient is healing well after a papilloma excision from her left breast. I discussed her pathology of an intraductal papilloma with no atypia or malignancy seen. The patient does appear to have a small seroma under the incision site however not tenting up and no need for drainage at this time. Had did instruct the patient to continue to wear a sports bra or some compression to the area to decrease the amount of drainage within the year and will have her return in about a week and a half to the office to ensure that this does not continue to enlarge and does not need drainage. Otherwise patient is to call  with any questions or concerns

## 2016-06-04 NOTE — Patient Instructions (Addendum)
Please call our office with any questions or concerns.  Please do not submerge in a tub, hot tub, or pool until incision is completely sealed.  Use sun block to incision area over the next year if this area will be exposed to sun. This helps decrease scarring.  You may now resume your normal activities. Listen to your body when lifting, if you have pain when lifting, stop and then try again in a few days.  If you develop redness, drainage, or pain at incision sites- call our office immediately and speak with a nurse.  Please wear a sports bra at night to help with your seroma.

## 2016-06-18 ENCOUNTER — Encounter: Payer: Self-pay | Admitting: Surgery

## 2016-06-20 ENCOUNTER — Encounter: Payer: Medicare Other | Admitting: Surgery

## 2016-06-25 ENCOUNTER — Encounter: Payer: Self-pay | Admitting: Surgery

## 2016-06-25 ENCOUNTER — Ambulatory Visit (INDEPENDENT_AMBULATORY_CARE_PROVIDER_SITE_OTHER): Payer: Medicare Other | Admitting: Surgery

## 2016-06-25 VITALS — BP 145/80 | HR 96 | Temp 99.4°F | Ht 63.0 in | Wt 180.4 lb

## 2016-06-25 DIAGNOSIS — D242 Benign neoplasm of left breast: Secondary | ICD-10-CM

## 2016-06-25 NOTE — Patient Instructions (Signed)
You will need to see an orthopedic hand specialist in regards to your decreased strength and mobility in your thumb. We will send the referral and call you with the appointment information.  We will have you follow-up after your appointment with the hand specialist to ensure that you are getting better.  You may return to work on 06/30/16. Please see work note provided.

## 2016-06-25 NOTE — Progress Notes (Signed)
70 year old female with a breast papilloma was removed on 8/31. Patient had been seen in follow-up in the office and felt that a seroma was beginning to form. She was instructed to wear her compression bra as this is gotten better. Patient states that the pain is much better and that the swelling has decreased and she no longer has any redness around the nipple area or complex. She also stated that she is having some numbness in her left thumb and weakness there as well and has been dropping things due to this. Patient states that she didn't notice this necessarily before the operation.   Vitals:   06/25/16 1343  BP: (!) 145/80  Pulse: 96  Temp: 99.4 F (37.4 C)   PE:  Gen:   Left breast: incision c/d/i, no erythema, no tinting of the skin induration or fluctuance   A/P:  Healing well after a left breast, excision. Patient has numbness in her left thumb as well as weakness will refer her to a hand specialist. Otherwise patient can return to work but with lifting restrictions due to her thumb.

## 2016-06-26 ENCOUNTER — Telehealth: Payer: Self-pay

## 2016-06-26 NOTE — Telephone Encounter (Signed)
Please refer patient to [facility].  Patient prefers [facility] if possible.

## 2016-06-26 NOTE — Telephone Encounter (Signed)
Patient has been advised of her appointment with [facility].   Appointment date: 07/14/16 @ 9:00am--Arrive at 8:45am. Dr Menz--[place] Location-[facility].   Patient was ok with appointment.

## 2016-12-02 DIAGNOSIS — N6019 Diffuse cystic mastopathy of unspecified breast: Secondary | ICD-10-CM | POA: Insufficient documentation

## 2016-12-02 DIAGNOSIS — E559 Vitamin D deficiency, unspecified: Secondary | ICD-10-CM | POA: Insufficient documentation

## 2016-12-02 DIAGNOSIS — Z8669 Personal history of other diseases of the nervous system and sense organs: Secondary | ICD-10-CM

## 2016-12-02 DIAGNOSIS — E7800 Pure hypercholesterolemia, unspecified: Secondary | ICD-10-CM | POA: Insufficient documentation

## 2016-12-02 DIAGNOSIS — T7840XA Allergy, unspecified, initial encounter: Secondary | ICD-10-CM | POA: Insufficient documentation

## 2016-12-03 ENCOUNTER — Telehealth: Payer: Self-pay

## 2016-12-03 ENCOUNTER — Ambulatory Visit (INDEPENDENT_AMBULATORY_CARE_PROVIDER_SITE_OTHER): Payer: Medicare Other | Admitting: General Surgery

## 2016-12-03 ENCOUNTER — Encounter: Payer: Self-pay | Admitting: General Surgery

## 2016-12-03 VITALS — BP 137/75 | HR 76 | Temp 98.4°F | Ht 63.0 in | Wt 174.8 lb

## 2016-12-03 DIAGNOSIS — N644 Mastodynia: Secondary | ICD-10-CM | POA: Diagnosis not present

## 2016-12-03 NOTE — Telephone Encounter (Signed)
Patient notified of appointments on previous note and verbalized understanding.  Dr.Menz-12/17/16 @ 9:45 am.

## 2016-12-03 NOTE — Patient Instructions (Signed)
We would like for you to have a Mammogram. I will call and arrange an appointment for the Mammogram and for you to see Dr,Menz regarding your shoulder pain. I will call you later today with the appointment date and times. We would like for you to Try Ibuprofen 400 mg every 6-8 hors for your pain. We will follow up with you after your Mammogram. We will follow up with you after your Mammogram and I will give you the appointment information later today. Please call our office if you have questions or concerns.

## 2016-12-03 NOTE — Telephone Encounter (Signed)
Left Message for patient to call office reagarding appointments below.  [facility]-12/09/16 @ 2:20 pm  Dr.Menz- 12/17/16 @ [facility]   Dr.Woodham- 12/24/16 @ 2:30 pm

## 2016-12-03 NOTE — Addendum Note (Signed)
Addended by: Celene Kras on: 12/03/2016 11:30 AM   Modules accepted: Orders
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
## 2016-12-09 ENCOUNTER — Ambulatory Visit
Admission: RE | Admit: 2016-12-09 | Discharge: 2016-12-09 | Disposition: A | Discharge status: Home / Self Care | Payer: Medicare Other | Source: Ambulatory Visit | Attending: General Surgery | Admitting: General Surgery

## 2016-12-09 ENCOUNTER — Other Ambulatory Visit: Payer: Self-pay | Admitting: General Surgery

## 2016-12-09 DIAGNOSIS — N644 Mastodynia: Secondary | ICD-10-CM

## 2016-12-10 ENCOUNTER — Telehealth: Payer: Self-pay

## 2016-12-10 NOTE — Telephone Encounter (Signed)
Patient notified of Normal Mammogram at this time. Patient has follow up appointment on 12/24/16. Patient was asked about the breast pain and replied she is still having the pain and taking the Ibuprofen. Encouraged patient to continue with the Ibuprofen and to keep her appointment on 12/24/16. Patient verbalized understanding.

## 2016-12-23 ENCOUNTER — Other Ambulatory Visit: Payer: Self-pay

## 2016-12-24 ENCOUNTER — Ambulatory Visit (INDEPENDENT_AMBULATORY_CARE_PROVIDER_SITE_OTHER): Payer: Medicare Other | Admitting: General Surgery

## 2016-12-24 ENCOUNTER — Other Ambulatory Visit: Payer: Self-pay

## 2016-12-24 ENCOUNTER — Encounter: Payer: Self-pay | Admitting: General Surgery

## 2016-12-24 VITALS — BP 126/73 | HR 76 | Temp 98.2°F | Ht 63.0 in | Wt 174.0 lb

## 2016-12-24 DIAGNOSIS — N644 Mastodynia: Secondary | ICD-10-CM | POA: Diagnosis not present

## 2016-12-24 NOTE — Patient Instructions (Signed)
Follow up with office if you have any questions or concerns.  Otherwise we will see you after your scheduled mammogram in July.

## 2016-12-24 NOTE — Progress Notes (Signed)
Outpatient Surgical Follow Up  12/24/2016  Diane Anderson is an 71 y.o. female.   Chief Complaint  Patient presents with  . Follow-up    Left Breast Pain    HPI: 71 year old female returns to clinic for evaluation of left breast pain. Patient has been seen numerous times for this in the past. She is now 8 months status post lumpectomy of that breast. She has undergone imaging since her last visit. She reports that the discomfort she had has improved but has not gone away. She continues perform self breast exams and does feel a small nodular area that is mobile on the breast. It is not changing in size. She denies any fevers, chills, nausea, vomiting, chest pain, shortness of breath, diarrhea, constipation.  Past Medical History:  Diagnosis Date  . Allergy    Seasonal   . Anxiety   . Arthritis    left knee, left shoulder  . Asthma   . Depression   . Diabetes mellitus without complication ([place])   . Fibrocystic breast disease   . GERD (gastroesophageal reflux disease)   . Hx of migraines   . Hypercholesteremia   . Hyperlipidemia   . Vitamin D deficiency     Past Surgical History:  Procedure Laterality Date  . BREAST BIOPSY Right   . BREAST BIOPSY Left 2005  . BREAST BIOPSY Left 04/18/2016   3:00 2 cmfn benign hyalinizing stroma cyst formation, 3:00 5cmfn benign ductal ectasia, 10:00 intraductal papilloma.  Marland Kitchen BREAST EXCISIONAL BIOPSY Right   . BREAST EXCISIONAL BIOPSY Left 05/15/2016   left breast to removed papilloma  . CYST EXCISION     Dr. Pat Patrick  . EXCISION OF BREAST BIOPSY Left 05/15/2016   Procedure: EXCISION OF BREAST BIOPSY;  Surgeon: Hubbard Robinson, MD;  Location: [facility] ORS;  Service: General;  Laterality: Left;  . LIPOMA EXCISION  1995   Right Shoulder  . LYMPH GLAND EXCISION Right 2011  . TONSILLECTOMY    . TUBAL LIGATION      Family History  Problem Relation Age of Onset  . Dementia Mother   . Cancer Mother     skin  . Osteoporosis Mother   .  Alzheimer's disease Father   . Asthma Father   . Hypertension Father   . Breast cancer Maternal Aunt 31  . Cancer Maternal Aunt     Breast Cancer  . Hypertension Maternal Aunt   . Birth defects Cousin 21    Breast  . Breast cancer Cousin   . Breast cancer Cousin   . Hypertension Sister   . Diabetes Paternal Grandmother     Social History:  reports that she has been smoking Cigarettes.  She has a 52.00 pack-year smoking history. She has never used smokeless tobacco. She reports that she does not drink alcohol or use drugs.  Allergies:  Allergies  Allergen Reactions  . Penicillins Rash  . Atorvastatin Other (See Comments)    Severe Sweating    Medications reviewed.    ROS A multipoint review of systems was completed, all pertinent positives and negatives are documented within the history of present illness and remainder are negative   BP 126/73   Pulse 76   Temp 98.2 F (36.8 C) (Oral)   Ht 5\' 3"  (1.6 m)   Wt 78.9 kg (174 lb)   BMI 30.82 kg/m   Physical Exam Gen.: No acute distress Neck: Supple and nontender Breast: Left breast examined. No dominant masses or lesions of concern. No  obvious nipple discharge. Well-healed prior lumpectomy incision sites. Chest: Clear to auscultation Heart: Regular rhythm Abdomen: Soft and nontender    No results found for this or any previous visit (from the past 48 hour(s)). No results found.  Assessment/Plan:  1. Breast pain, left 71 year old female with left breast pain. Reviewed recent imaging with the patient. Discussed signs and symptoms of breast infection or recurrence of mass. She understands to follow-up in clinic immediately should she does anything different on exam. Otherwise she will have her imaging repeated in July and she'll follow-up in clinic afterwards. All questions answered to patient's satisfaction.  A total of 15 minutes was used on this encounter with greater than 50% of her counseling her coordination  of care.     Clayburn Pert, MD FACS General Surgeon  12/24/2016,3:46 PM

## 2017-01-27 ENCOUNTER — Telehealth: Payer: Self-pay

## 2017-01-27 NOTE — Telephone Encounter (Signed)
Patient is calling because her left breast is draining. It is a watery yellow/tinted color. She has bronchitis so she is unable to notice a smell. She has a slight fever of about 100, denies vomiting, nausea, abdominal pain or diarrhea. Patient was very nervous on the phone. I had to repeat the questions multiple times. Dr. Adonis Huguenin has taken over her case but he has nothing open this week. Please call patient and advice.

## 2017-01-27 NOTE — Telephone Encounter (Signed)
Returned phone call to patient she expresses that she has a Small hardened area approximately the size of a pea near her Left areola that she is concerned about, she states that it drains more in morning and with pressure. Clear yellow drainage. Denies nausea/vomiting. Denies any new lumps in any other portion of either breast.  Patient states that she has had a "low grade fever" with Tmax of 100.0 F x 2 weeks. She also states that she has had bronchitis for 1 month is out of her medication for this and also has had a toothache x 4 days. She does have a Call in to her PCP about these issues.  Patient has been placed on Dr. Reginal Lutes schedule for tomorrow.

## 2017-01-28 ENCOUNTER — Telehealth: Payer: Self-pay

## 2017-01-28 ENCOUNTER — Ambulatory Visit (INDEPENDENT_AMBULATORY_CARE_PROVIDER_SITE_OTHER): Payer: Medicare Other | Admitting: General Surgery

## 2017-01-28 ENCOUNTER — Encounter: Payer: Self-pay | Admitting: General Surgery

## 2017-01-28 VITALS — BP 166/72 | HR 77 | Temp 98.4°F | Ht 63.0 in | Wt 173.2 lb

## 2017-01-28 DIAGNOSIS — D242 Benign neoplasm of left breast: Secondary | ICD-10-CM

## 2017-01-28 DIAGNOSIS — N6452 Nipple discharge: Secondary | ICD-10-CM

## 2017-01-28 HISTORY — DX: Nipple discharge: N64.52

## 2017-01-28 NOTE — Progress Notes (Signed)
Outpatient Surgical Follow Up  01/28/2017  Diane Anderson is an 71 y.o. female.   Chief Complaint  Patient presents with  . Routine Post Op    Breast Drainage    HPI: 71 year old female who is well known to the surgery clinic returns for follow-up due to new onset of left-sided nipple discharge. Patient reports that twice in the last week she's had a clear or yellow drainage from the left nipple. She was concerned because her last surgical intervention started due to drainage from the breast. It is remained clear real. None in the last 48 hours. She is running a low-grade fever but also has a current tooth abscess. She denies any chills, chest pain, shortness breath, nausea, vomiting, diarrhea, constipation. She has been able to decrease her tobacco usage from 2 packs a day to less than 1 pack a day.  Past Medical History:  Diagnosis Date  . Allergy    Seasonal   . Anxiety   . Arthritis    left knee, left shoulder  . Asthma   . Depression   . Diabetes mellitus without complication ([place])   . Fibrocystic breast disease   . GERD (gastroesophageal reflux disease)   . Hx of migraines   . Hypercholesteremia   . Hyperlipidemia   . Vitamin D deficiency     Past Surgical History:  Procedure Laterality Date  . BREAST BIOPSY Right   . BREAST BIOPSY Left 2005  . BREAST BIOPSY Left 04/18/2016   3:00 2 cmfn benign hyalinizing stroma cyst formation, 3:00 5cmfn benign ductal ectasia, 10:00 intraductal papilloma.  Marland Kitchen BREAST EXCISIONAL BIOPSY Right   . BREAST EXCISIONAL BIOPSY Left 05/15/2016   left breast to removed papilloma  . CYST EXCISION     Dr. Pat Patrick  . EXCISION OF BREAST BIOPSY Left 05/15/2016   Procedure: EXCISION OF BREAST BIOPSY;  Surgeon: Hubbard Robinson, MD;  Location: [facility] ORS;  Service: General;  Laterality: Left;  . LIPOMA EXCISION  1995   Right Shoulder  . LYMPH GLAND EXCISION Right 2011  . TONSILLECTOMY    . TUBAL LIGATION      Family History  Problem Relation  Age of Onset  . Dementia Mother   . Cancer Mother        skin  . Osteoporosis Mother   . Alzheimer's disease Father   . Asthma Father   . Hypertension Father   . Breast cancer Maternal Aunt 71  . Cancer Maternal Aunt        Breast Cancer  . Hypertension Maternal Aunt   . Birth defects Cousin 93       Breast  . Breast cancer Cousin   . Breast cancer Cousin   . Hypertension Sister   . Diabetes Paternal Grandmother     Social History:  reports that she has been smoking Cigarettes.  She has a 52.00 pack-year smoking history. She has never used smokeless tobacco. She reports that she does not drink alcohol or use drugs.  Allergies:  Allergies  Allergen Reactions  . Penicillins Rash  . Atorvastatin Other (See Comments)    Severe Sweating    Medications reviewed.    ROS A multipoint review of systems was completed. All pertinent positives and negatives are documented within the history of present illness and remainder are negative.   BP (!) 166/72   Pulse 77   Temp 98.4 F (36.9 C) (Oral)   Ht 5\' 3"  (1.6 m)   Wt 78.6 kg (173 lb  3.2 oz)   BMI 30.68 kg/m   Physical Exam Gen.: No acute distress Neck: Supple and nontender Lymph nodes: No evidence of cervical, clavicular, axillary lymphadenopathy Breast: Bilateral breast exam. Right breast without any pathologic findings. Left breast with palpable scar tissue at the 12:00 and 3:00 position from the nipple area complex. Visible dominant duct within the nipple but no expressible drainage on exam. There is minimally tender at the 12:00 position but no dominant mass or concerning finding. Chest: Clear to auscultation Heart: Regular rhythm Abdomen: Soft and nontender    No results found for this or any previous visit (from the past 48 hour(s)). No results found.  Assessment/Plan:  1. Discharge from left nipple 71 year old female well-known the surgery now with new nipple discharge on the left. The area of discharge is  at the same place as the previous excisional biopsy. No expressible discharge today. Patient had images performed last month without concerning finding. Discussed with the patient that we would continue with the plan for images in July and less the drainage increases in amount or becomes a more concerning color and character. Described in detail what concerning amounts or color are and the patient voiced understanding. She'll follow up on an as-needed basis or after her repeat images in July.     Clayburn Pert, MD FACS General Surgeon  01/28/2017,10:12 AM

## 2017-01-28 NOTE — Patient Instructions (Signed)
I will schedule your Mammogram and give you a call later today.  If the drainage gets worse let us know and we can get you in to have the Mammogram sooner. Please call our office if you have questions or concerns.

## 2017-01-28 NOTE — Telephone Encounter (Signed)
Mammogram scheduled 03/30/17 @ 10:40 am [facility]. Patient notified and appointment reminder mailed.

## 2017-01-28 NOTE — Telephone Encounter (Signed)
Orders placed for Mammogram in July.

## 2017-03-30 ENCOUNTER — Ambulatory Visit
Admission: RE | Admit: 2017-03-30 | Discharge: 2017-03-30 | Disposition: A | Discharge status: Home / Self Care | Payer: Medicare Other | Source: Ambulatory Visit | Attending: General Surgery | Admitting: General Surgery

## 2017-03-30 DIAGNOSIS — D242 Benign neoplasm of left breast: Secondary | ICD-10-CM | POA: Diagnosis present

## 2017-03-31 ENCOUNTER — Telehealth: Payer: Self-pay

## 2017-03-31 DIAGNOSIS — N6452 Nipple discharge: Secondary | ICD-10-CM

## 2017-03-31 NOTE — Telephone Encounter (Signed)
Spoke with Mardene Celeste at [facility] and order was placed in [place] for MRI.  Mardene Celeste stated she would call patient and schedule an appointment.

## 2017-03-31 NOTE — Telephone Encounter (Signed)
error 

## 2017-03-31 NOTE — Telephone Encounter (Signed)
Patient notified of MRI appointment 04/12/17 @ 3:20 pm  Also changed appointment with Dr.Woodham to 04/20/17 @ 1:30 pm.

## 2017-04-08 ENCOUNTER — Ambulatory Visit: Payer: Self-pay | Admitting: General Surgery

## 2017-04-10 ENCOUNTER — Ambulatory Visit: Payer: Self-pay | Admitting: General Surgery

## 2017-04-10 ENCOUNTER — Encounter: Payer: Medicare Other | Admitting: General Surgery

## 2017-04-12 ENCOUNTER — Ambulatory Visit
Admission: RE | Admit: 2017-04-12 | Discharge: 2017-04-12 | Disposition: A | Discharge status: Home / Self Care | Payer: Medicare Other | Source: Ambulatory Visit | Attending: General Surgery | Admitting: General Surgery

## 2017-04-12 DIAGNOSIS — N6452 Nipple discharge: Secondary | ICD-10-CM

## 2017-04-12 MED ORDER — GADOBENATE DIMEGLUMINE 529 MG/ML IV SOLN
15.0000 mL | Freq: Once | INTRAVENOUS | Status: AC | PRN
  Administered 2017-04-12: 15 mL via INTRAVENOUS

## 2017-04-14 ENCOUNTER — Telehealth: Payer: Self-pay

## 2017-04-14 ENCOUNTER — Other Ambulatory Visit: Payer: Self-pay

## 2017-04-14 DIAGNOSIS — N6320 Unspecified lump in the left breast, unspecified quadrant: Secondary | ICD-10-CM

## 2017-04-14 NOTE — Telephone Encounter (Signed)
Called patient to let her know that her MRI of her Breast was benign and that the radiologist recommended for her to get a Left Diagnostic Mammography and ultrasound in 6 months. Patient was told that we could remind her in 6 months about her appointment at [facility]. Patient agreed, was happy with results and had no further questions.

## 2017-04-16 ENCOUNTER — Other Ambulatory Visit: Payer: Self-pay

## 2017-04-16 DIAGNOSIS — D242 Benign neoplasm of left breast: Secondary | ICD-10-CM

## 2017-04-20 ENCOUNTER — Encounter: Payer: Self-pay | Admitting: General Surgery

## 2017-04-20 ENCOUNTER — Ambulatory Visit (INDEPENDENT_AMBULATORY_CARE_PROVIDER_SITE_OTHER): Payer: Medicare Other | Admitting: General Surgery

## 2017-04-20 VITALS — BP 158/72 | HR 81 | Temp 98.1°F | Ht 63.0 in | Wt 170.6 lb

## 2017-04-20 DIAGNOSIS — N644 Mastodynia: Secondary | ICD-10-CM

## 2017-04-20 NOTE — Progress Notes (Signed)
Outpatient Surgical Follow Up  04/20/2017  Diane Anderson is an 71 y.o. female.   Chief Complaint  Patient presents with  . Follow-up    MRI Breast    HPI: 71 year old female returns to clinic for follow-up of her left breast pain. Since her last visit she's had repeat imaging to include mammogram, ultrasound, MRI. They have all been reassuring without atypical findings. She reports that the nipple discharge that initiated the workup resolved on its own and has not recurred since the week before her mammogram. She continues to have the occasional twinge of pain to her left breast along her incision site. Is primarily with pressure to the area. She denies any fevers, chills, nausea, vomiting, chest pain, shortness of breath, diarrhea, constipation. She is otherwise in her usual state of health. As an aside she commented upon a painful superficial nodule to her left elbow that causes her discomfort if she leans on table with her elbows. It is never been red or drained.  Past Medical History:  Diagnosis Date  . Allergy    Seasonal   . Anxiety   . Arthritis    left knee, left shoulder  . Asthma   . Depression   . Diabetes mellitus without complication ([place])   . Fibrocystic breast disease   . GERD (gastroesophageal reflux disease)   . Hx of migraines   . Hypercholesteremia   . Hyperlipidemia   . Vitamin D deficiency     Past Surgical History:  Procedure Laterality Date  . BREAST BIOPSY Right 2011   stereo bx benign  . BREAST BIOPSY Left 04/18/2016   x3. 3:00 2 cmfn benign hyalinizing stroma cyst formation, 3:00 5cmfn benign ductal ectasia, 10:00 intraductal papilloma.  Marland Kitchen BREAST EXCISIONAL BIOPSY Right yrs ago    benign  . BREAST EXCISIONAL BIOPSY Left 05/15/2016   excision to remove papilloma at 10:00 location  . CYST EXCISION     Dr. Pat Patrick  . EXCISION OF BREAST BIOPSY Left 05/15/2016   Procedure: EXCISION OF BREAST BIOPSY;  Surgeon: Hubbard Robinson, MD;  Location: [facility]  ORS;  Service: General;  Laterality: Left;  . LIPOMA EXCISION  1995   Right Shoulder  . LYMPH GLAND EXCISION Right 2011  . TONSILLECTOMY    . TUBAL LIGATION      Family History  Problem Relation Age of Onset  . Dementia Mother   . Cancer Mother        skin  . Osteoporosis Mother   . Alzheimer's disease Father   . Asthma Father   . Hypertension Father   . Breast cancer Maternal Aunt 23  . Cancer Maternal Aunt        Breast Cancer  . Hypertension Maternal Aunt   . Birth defects Cousin 39       Breast  . Breast cancer Cousin   . Breast cancer Cousin   . Hypertension Sister   . Diabetes Paternal Grandmother     Social History:  reports that she has been smoking Cigarettes.  She has a 52.00 pack-year smoking history. She has never used smokeless tobacco. She reports that she does not drink alcohol or use drugs.  Allergies:  Allergies  Allergen Reactions  . Penicillins Rash  . Atorvastatin Other (See Comments)    Severe Sweating    Medications reviewed.    ROS A multipoint review of systems was completed. All pertinent positives and negatives are documented within the history of present illness and remainder are negative  BP (!) 158/72   Pulse 81   Temp 98.1 F (36.7 C) (Oral)   Ht 5\' 3"  (1.6 m)   Wt 77.4 kg (170 lb 9.6 oz)   BMI 30.22 kg/m   Physical Exam Gen.: No acute distress Neck: Supple nontender Chest: Clear to auscultation Breast: Bilateral breast exam. Well-healed prior excisional site from the left nipple areolar complex. No concerning palpable masses, nodules, findings on exam. No double discharge. Heart: Regular rhythm Abdomen: Soft and nontender Skin: 4 mm palpable cutaneous nodule to the left elbow. It is superficial and freely mobile.     No results found for this or any previous visit (from the past 48 hour(s)). No results found.  Assessment/Plan:  1. Breast pain, left 71 year old female with a history of left breast pain 1 year  status post surgery for an excisional biopsy. Doing well. Continues to have occasional twinges of pain but stated improving. She'll continue every 6 month follow-up with repeat images in January. As far as a superficial nodule goes discuss it can be removed as a local procedure in clinic. She'll follow-up in clinic in September after she returns from vacation.  A total of 15 minutes was used on this encounter. 50% of it user counseling her coordination of care.   Clayburn Pert, MD FACS General Surgeon  04/20/2017,2:06 PM

## 2017-04-20 NOTE — Patient Instructions (Addendum)
We will excise the area on your Left Elbow in September as scheduled below.  We will see you back in 6 months after your yearly Mammogram. We will call you when this is scheduled.  In regards to your constipation, try using Miralax (Polyethelene Glycol) 1-2 capfuls daily with 72 ounces of water throughout the day. See information below.  Polyethylene Glycol powder What is this medicine? POLYETHYLENE GLYCOL 3350 (pol ee ETH i leen; GLYE col) powder is a laxative used to treat constipation. It increases the amount of water in the stool. Bowel movements become easier and more frequent. This medicine may be used for other purposes; ask your health care provider or pharmacist if you have questions. COMMON BRAND NAME(S): Sharlyn Bologna, GlycoLax, MiraLax, Smooth LAX, [employer] What should I tell my health care provider before I take this medicine? They need to know if you have any of these conditions: -a history of blockage of the stomach or intestine -current abdomen distension or pain -difficulty swallowing -diverticulitis, ulcerative colitis, or other chronic bowel disease -phenylketonuria -an unusual or allergic reaction to polyethylene glycol, other medicines, dyes, or preservatives -pregnant or trying to get pregnant -breast-feeding How should I use this medicine? Take this medicine by mouth. The bottle has a measuring cap that is marked with a line. Pour the powder into the cap up to the marked line (the dose is about 1 heaping tablespoon). Add the powder in the cap to a full glass (4 to 8 ounces or 120 to 240 ml) of water, juice, soda, coffee or tea. Mix the powder well. Drink the solution. Take exactly as directed. Do not take your medicine more often than directed. Talk to your pediatrician regarding the use of this medicine in children. Special care may be needed. Overdosage: If you think you have taken too much of this medicine contact a poison control center or emergency room at  once. NOTE: This medicine is only for you. Do not share this medicine with others. What if I miss a dose? If you miss a dose, take it as soon as you can. If it is almost time for your next dose, take only that dose. Do not take double or extra doses. What may interact with this medicine? Interactions are not expected. This list may not describe all possible interactions. Give your health care provider a list of all the medicines, herbs, non-prescription drugs, or dietary supplements you use. Also tell them if you smoke, drink alcohol, or use illegal drugs. Some items may interact with your medicine. What should I watch for while using this medicine? Do not use for more than 2 weeks without advice from your doctor or health care professional. It can take 2 to 4 days to have a bowel movement and to experience improvement in constipation. See your health care professional for any changes in bowel habits, including constipation, that are severe or last longer than three weeks. Always take this medicine with plenty of water. What side effects may I notice from receiving this medicine? Side effects that you should report to your doctor or health care professional as soon as possible: -diarrhea -difficulty breathing -itching of the skin, hives, or skin rash -severe bloating, pain, or distension of the stomach -vomiting Side effects that usually do not require medical attention (report to your doctor or health care professional if they continue or are bothersome): -bloating or gas -lower abdominal discomfort or cramps -nausea This list may not describe all possible side effects. Call your  doctor for medical advice about side effects. You may report side effects to FDA at 1-800-FDA-1088. Where should I keep my medicine? Keep out of the reach of children. Store between 15 and 30 degrees C (59 and 86 degrees F). Throw away any unused medicine after the expiration date. NOTE: This sheet is a summary. It  may not cover all possible information. If you have questions about this medicine, talk to your doctor, pharmacist, or health care provider.  2018 Elsevier/Gold Standard (2008-04-03 16:50:45)

## 2017-05-28 ENCOUNTER — Ambulatory Visit: Payer: Self-pay | Admitting: General Surgery

## 2017-06-18 ENCOUNTER — Encounter: Payer: Self-pay | Admitting: General Surgery

## 2017-06-18 ENCOUNTER — Ambulatory Visit (INDEPENDENT_AMBULATORY_CARE_PROVIDER_SITE_OTHER): Payer: Medicare Other | Admitting: General Surgery

## 2017-06-18 ENCOUNTER — Other Ambulatory Visit: Payer: Self-pay | Admitting: General Surgery

## 2017-06-18 ENCOUNTER — Telehealth: Payer: Self-pay

## 2017-06-18 VITALS — BP 154/78 | HR 72 | Temp 98.4°F | Ht 63.0 in | Wt 170.0 lb

## 2017-06-18 DIAGNOSIS — L723 Sebaceous cyst: Secondary | ICD-10-CM

## 2017-06-18 NOTE — Addendum Note (Signed)
Addended by: Celene Kras on: 06/18/2017 10:20 AM   Modules accepted: Orders

## 2017-06-18 NOTE — Patient Instructions (Signed)
Please do not submerge your elbow in water until sutures have been removed. You may take Ibuprofen or Tylenol for the discomfort. Please keep a dressing over the area for at least 48 hours.  If you notice redness, drainage, or develop a fever please call and let us know.   Please see your follow up appointment listed below.

## 2017-06-18 NOTE — Telephone Encounter (Signed)
Spoke with Ferman Hamming # [phone] for specimen pick up>  Specimen packed for transport and placed in [facility] drop box and placed on door.

## 2017-06-18 NOTE — Progress Notes (Signed)
Outpatient Surgical Follow Up  06/18/2017  Diane Anderson is an 71 y.o. female.   Chief Complaint  Patient presents with  . Other    Procedure: Excision of Elbow Cyst    HPI: 71 year old female presents to clinic for procedure appointment to remove a cyst of her left elbow. Patient denies any changes in her health since her last visit. She denies any fevers, chills, nausea, vomiting, chest pain, shortness of breath, diarrhea, constipation. She reports that her left breast pain continues with the nipple discharge has remained resolved. No other issues.  Past Medical History:  Diagnosis Date  . Allergy    Seasonal   . Anxiety   . Arthritis    left knee, left shoulder  . Asthma   . Depression   . Diabetes mellitus without complication ([place])   . Fibrocystic breast disease   . GERD (gastroesophageal reflux disease)   . Hx of migraines   . Hypercholesteremia   . Hyperlipidemia   . Vitamin D deficiency     Past Surgical History:  Procedure Laterality Date  . BREAST BIOPSY Right 2011   stereo bx benign  . BREAST BIOPSY Left 04/18/2016   x3. 3:00 2 cmfn benign hyalinizing stroma cyst formation, 3:00 5cmfn benign ductal ectasia, 10:00 intraductal papilloma.  Marland Kitchen BREAST EXCISIONAL BIOPSY Right yrs ago    benign  . BREAST EXCISIONAL BIOPSY Left 05/15/2016   excision to remove papilloma at 10:00 location  . CYST EXCISION     Dr. Pat Patrick  . EXCISION OF BREAST BIOPSY Left 05/15/2016   Procedure: EXCISION OF BREAST BIOPSY;  Surgeon: Hubbard Robinson, MD;  Location: [facility] ORS;  Service: General;  Laterality: Left;  . LIPOMA EXCISION  1995   Right Shoulder  . LYMPH GLAND EXCISION Right 2011  . TONSILLECTOMY    . TUBAL LIGATION      Family History  Problem Relation Age of Onset  . Dementia Mother   . Cancer Mother        skin  . Osteoporosis Mother   . Alzheimer's disease Father   . Asthma Father   . Hypertension Father   . Breast cancer Maternal Aunt 80  . Cancer Maternal  Aunt        Breast Cancer  . Hypertension Maternal Aunt   . Birth defects Cousin 59       Breast  . Breast cancer Cousin   . Breast cancer Cousin   . Hypertension Sister   . Diabetes Paternal Grandmother     Social History:  reports that she has been smoking Cigarettes.  She has a 52.00 pack-year smoking history. She has never used smokeless tobacco. She reports that she does not drink alcohol or use drugs.  Allergies:  Allergies  Allergen Reactions  . Penicillins Rash  . Atorvastatin Other (See Comments)    Severe Sweating    Medications reviewed.    ROS A multipoint review of systems was completed, all pertinent positives and negatives are documented in the history of present illness and remainder are negative   BP (!) 154/78   Pulse 72   Temp 98.4 F (36.9 C) (Oral)   Ht 5\' 3"  (1.6 m)   Wt 77.1 kg (170 lb)   BMI 30.11 kg/m   Physical Exam Gen.: No acute distress Chest: Clear to auscultation Heart: Regular rhythm Abdomen: Soft and nontender Skin: Palpable 4 mm cyst to the left elbow that is freely mobile and superficial.    No results found  for this or any previous visit (from the past 48 hour(s)). No results found.  Assessment/Plan:  1. Sebaceous cyst 71 year old female with a cyst to the left elbow. Here for a minor procedure to remove a cyst. The procedure was described in detail and an informed consent was obtained preoperatively.  The patient was prepped and draped in the standard sterile fashion and a timeout was performed that correctly identify the patient, procedure, laterality. The obvious cyst was localized with a 1% lidocaine with epinephrine solution. Once the area was numb and elliptical incision was made with a 15 blade scalpel. It became apparent that this was a sebaceous cyst and it was circumferentially excised without any difficulty. It measured approximately 4 x 4 by 3 mm. The cyst was excised in toto and passed off as specimen. The entire  field was then re-localized before mentioned local anesthetic and the skin was reapproximated with 2 simple interrupted 3-0 nylon sutures. A sterile dressing of plain gauze and tape was placed over this. The patient tolerated the procedure well.  Instructed the patient on postoperative care of the area. She'll follow-up in clinic in 10 days for a repeat exam and suture removal if it is ready.     Clayburn Pert, MD FACS General Surgeon  06/18/2017,9:40 AM

## 2017-06-23 ENCOUNTER — Telehealth: Payer: Self-pay

## 2017-06-23 LAB — PATHOLOGY

## 2017-06-23 NOTE — Telephone Encounter (Signed)
Patient notified of pathology results.  Confirmed follow up appointment 07/02/17 @ 9:30 with Dr.Woodham.

## 2017-07-02 ENCOUNTER — Ambulatory Visit (INDEPENDENT_AMBULATORY_CARE_PROVIDER_SITE_OTHER): Payer: Medicare Other | Admitting: General Surgery

## 2017-07-02 ENCOUNTER — Encounter: Payer: Self-pay | Admitting: General Surgery

## 2017-07-02 ENCOUNTER — Encounter: Payer: Medicare Other | Admitting: General Surgery

## 2017-07-02 VITALS — BP 142/81 | HR 71 | Temp 98.1°F | Ht 63.0 in | Wt 169.0 lb

## 2017-07-02 DIAGNOSIS — Z4889 Encounter for other specified surgical aftercare: Secondary | ICD-10-CM

## 2017-07-02 NOTE — Patient Instructions (Signed)
We will send the referral to [facility] clinic. Someone from their office will contact you to schedule an appointment.   We removed your sutures today and the area looked well healed.  Please call our office if you have questions or concerns.

## 2017-07-02 NOTE — Progress Notes (Signed)
Outpatient Surgical Follow Up  07/02/2017  Diane Anderson is an 71 y.o. female.   Chief Complaint  Patient presents with  . Follow-up    Post procedure- Excision of left elbow cyst-06/18/17 Dr.Jaeley Wiker    HPI: 71 year old female status post excision of a left elbow cyst.patient reports some discomfort if it is bumped in the wrong way but otherwise doing well. Denies any fevers, chills, nausea, vomiting, chest pain, shortness breath, diarrhea, constipation. States the area is healing well. Also states she continues to smoke.  Past Medical History:  Diagnosis Date  . Allergy    Seasonal   . Anxiety   . Arthritis    left knee, left shoulder  . Asthma   . Depression   . Diabetes mellitus without complication ([place])   . Fibrocystic breast disease   . GERD (gastroesophageal reflux disease)   . Hx of migraines   . Hypercholesteremia   . Hyperlipidemia   . Vitamin D deficiency     Past Surgical History:  Procedure Laterality Date  . BREAST BIOPSY Right 2011   stereo bx benign  . BREAST BIOPSY Left 04/18/2016   x3. 3:00 2 cmfn benign hyalinizing stroma cyst formation, 3:00 5cmfn benign ductal ectasia, 10:00 intraductal papilloma.  Marland Kitchen BREAST EXCISIONAL BIOPSY Right yrs ago    benign  . BREAST EXCISIONAL BIOPSY Left 05/15/2016   excision to remove papilloma at 10:00 location  . CYST EXCISION     Dr. Pat Patrick  . EXCISION OF BREAST BIOPSY Left 05/15/2016   Procedure: EXCISION OF BREAST BIOPSY;  Surgeon: Hubbard Robinson, MD;  Location: [facility] ORS;  Service: General;  Laterality: Left;  . LIPOMA EXCISION  1995   Right Shoulder  . LYMPH GLAND EXCISION Right 2011  . TONSILLECTOMY    . TUBAL LIGATION      Family History  Problem Relation Age of Onset  . Dementia Mother   . Cancer Mother        skin  . Osteoporosis Mother   . Alzheimer's disease Father   . Asthma Father   . Hypertension Father   . Breast cancer Maternal Aunt 36  . Cancer Maternal Aunt        Breast Cancer   . Hypertension Maternal Aunt   . Birth defects Cousin 23       Breast  . Breast cancer Cousin   . Breast cancer Cousin   . Hypertension Sister   . Diabetes Paternal Grandmother     Social History:  reports that she has been smoking Cigarettes.  She has a 52.00 pack-year smoking history. She has never used smokeless tobacco. She reports that she does not drink alcohol or use drugs.  Allergies:  Allergies  Allergen Reactions  . Penicillins Rash  . Atorvastatin Other (See Comments)    Severe Sweating    Medications reviewed.    ROS A multipoint review of systems was completed, all pertinent positives and negatives are documented within the history of present illness and the remainder are negative   BP (!) 142/81   Pulse 71   Temp 98.1 F (36.7 C) (Oral)   Ht 5\' 3"  (1.6 m)   Wt 76.7 kg (169 lb)   BMI 29.94 kg/m   Physical Exam Gen.: No acute distress Chest: Clear to auscultation  heart: Regular rate and rhythm Abdomen: Soft and nontender Can: Left elbow cyst excision site well approximated and healed. Suture still in place without evidence of erythema or drainage.    No  results found for this or any previous visit (from the past 34 hour(s)). No results found.  Assessment/Plan:  1. Aftercare following surgery 71 year old female status post excision of left elbow cyst. This was done approximately 14 days ago. Doing very well. Sutures removed today without adifficulty. Discussed the signs and symptoms of infection and return to clinic should they occur. Otherwise she'll follow up on an as-needed basis.     Clayburn Pert, MD FACS General Surgeon  07/02/2017,9:58 AM

## 2017-10-14 ENCOUNTER — Other Ambulatory Visit: Payer: Medicare Other

## 2017-10-14 ENCOUNTER — Ambulatory Visit
Admission: RE | Admit: 2017-10-14 | Discharge: 2017-10-14 | Disposition: A | Discharge status: Home / Self Care | Payer: Medicare Other | Source: Ambulatory Visit | Attending: General Surgery | Admitting: General Surgery

## 2017-10-14 DIAGNOSIS — D242 Benign neoplasm of left breast: Secondary | ICD-10-CM | POA: Diagnosis present

## 2017-10-15 ENCOUNTER — Ambulatory Visit (INDEPENDENT_AMBULATORY_CARE_PROVIDER_SITE_OTHER): Payer: Medicare Other | Admitting: General Surgery

## 2017-10-15 ENCOUNTER — Encounter: Payer: Self-pay | Admitting: General Surgery

## 2017-10-15 VITALS — BP 173/99 | HR 76 | Temp 98.4°F | Ht 63.0 in | Wt 175.8 lb

## 2017-10-15 DIAGNOSIS — Z1231 Encounter for screening mammogram for malignant neoplasm of breast: Secondary | ICD-10-CM

## 2017-10-15 DIAGNOSIS — N6452 Nipple discharge: Secondary | ICD-10-CM

## 2017-10-15 NOTE — Progress Notes (Signed)
Outpatient Surgical Follow Up  10/15/2017  Diane Anderson is an 72 y.o. female.   Chief Complaint  Patient presents with  . Follow-up    Left Breast Mammogram [place] results    HPI: 72 year old female returns to clinic for follow-up.  She is now approximately 16 months status post excisional breast biopsy for papilloma.  She reports no new findings on her breast.  She has occasional twinges of pain at the area of her excisional biopsy on the left.  She has occasional clear discharge from her left nipple.  It is not all the time and it is not every day.  She denies new masses.  She had a repeat images yesterday without any concerning findings.  She denies any fevers, chills, nausea, vomiting, chest pain, shortness of breath, diarrhea, constipation.  She is working on quitting smoking.  Past Medical History:  Diagnosis Date  . Allergy    Seasonal   . Anxiety   . Arthritis    left knee, left shoulder  . Asthma   . Depression   . Diabetes mellitus without complication ([place])   . Fibrocystic breast disease   . GERD (gastroesophageal reflux disease)   . Hx of migraines   . Hypercholesteremia   . Hyperlipidemia   . Vitamin D deficiency     Past Surgical History:  Procedure Laterality Date  . BREAST BIOPSY Right 2011   stereo bx benign  . BREAST BIOPSY Left 04/18/2016   x3. 3:00 2 cmfn benign hyalinizing stroma cyst formation, 3:00 5cmfn benign ductal ectasia, 10:00 intraductal papilloma.  Marland Kitchen BREAST EXCISIONAL BIOPSY Right yrs ago    benign  . BREAST EXCISIONAL BIOPSY Left 05/15/2016   excision to remove papilloma at 10:00 location  . CYST EXCISION     Dr. Pat Patrick  . EXCISION OF BREAST BIOPSY Left 05/15/2016   Procedure: EXCISION OF BREAST BIOPSY;  Surgeon: Hubbard Robinson, MD;  Location: [facility] ORS;  Service: General;  Laterality: Left;  . LIPOMA EXCISION  1995   Right Shoulder  . LYMPH GLAND EXCISION Right 2011  . TONSILLECTOMY    . TUBAL LIGATION      Family History   Problem Relation Age of Onset  . Dementia Mother   . Cancer Mother        skin  . Osteoporosis Mother   . Alzheimer's disease Father   . Asthma Father   . Hypertension Father   . Breast cancer Maternal Aunt 32  . Cancer Maternal Aunt        Breast Cancer  . Hypertension Maternal Aunt   . Birth defects Cousin 64       Breast  . Breast cancer Cousin   . Breast cancer Cousin   . Hypertension Sister   . Diabetes Paternal Grandmother     Social History:  reports that she has been smoking cigarettes.  She has a 52.00 pack-year smoking history. she has never used smokeless tobacco. She reports that she does not drink alcohol or use drugs.  Allergies:  Allergies  Allergen Reactions  . Penicillins Rash  . Atorvastatin Other (See Comments)    Severe Sweating    Medications reviewed.    ROS A multipoint review of systems was completed, all pertinent positives and negatives are documented within the HPI and the remainder are negative   BP (!) 173/99   Pulse 76   Temp 98.4 F (36.9 C) (Oral)   Ht 5\' 3"  (1.6 m)   Wt 79.7 kg (  175 lb 12.8 oz)   BMI 31.14 kg/m   Physical Exam General: No acute distress Neck: Supple and nontender without masses Breast: Bilateral breast examined.  No evidence of masses or lesions to either breast or axilla.  No expressible nipple discharge on exam today. Chest: Clear to auscultation Heart: Regular rate and rhythm Abdomen: Soft and nontender    No results found for this or any previous visit (from the past 48 hour(s)). US Breast Ltd Uni Left Inc Axilla  Result Date: 10/14/2017 CLINICAL DATA:  Persistent intermittent nonspontaneous milky left nipple discharge. Patient has had several benign core needle biopsies and excisional biopsies of her left breast, and a negative breast MRI in 2018. Today's exam is of follow-up of probably benign left 11 o'clock retroareolar area identified sonographically on 03/30/2017. EXAM: 2D DIGITAL DIAGNOSTIC LEFT  MAMMOGRAM WITH CAD AND ADJUNCT TOMO ULTRASOUND LEFT BREAST COMPARISON:  Previous exam(s). ACR Breast Density Category c: The breast tissue is heterogeneously dense, which may obscure small masses. FINDINGS: Mammographically, there are no suspicious masses, areas of nonsurgical architectural distortion or microcalcifications in the left breast. Stable post biopsy and postsurgical changes in the left breast with architectural distortion in the subareolar left breast. Mammographic images were processed with CAD. On physical exam, no suspicious masses are palpated. Targeted ultrasound is performed, showing stable mildly prominent hyperechoic area in the left retroareolar 11 o'clock breast, which may represent distorted glandular tissue from prior excisional biopsy. No suspicious masses are seen. IMPRESSION: No mammographic or sonographic evidence of malignancy in the left breast. Stable postsurgical and post biopsy changes in the left breast. RECOMMENDATION: The patient is due for a bilateral screening mammogram in July 2019, to restore her annual screening schedule. I have discussed the findings and recommendations with the patient. Results were also provided in writing at the conclusion of the visit. If applicable, a reminder letter will be sent to the patient regarding the next appointment. BI-RADS CATEGORY  2: Benign. Electronically Signed   By: Fidela Salisbury M.D.   On: 10/14/2017 10:32   Mm Diag Breast Tomo Uni Left  Result Date: 10/14/2017 CLINICAL DATA:  Persistent intermittent nonspontaneous milky left nipple discharge. Patient has had several benign core needle biopsies and excisional biopsies of her left breast, and a negative breast MRI in 2018. Today's exam is of follow-up of probably benign left 11 o'clock retroareolar area identified sonographically on 03/30/2017. EXAM: 2D DIGITAL DIAGNOSTIC LEFT MAMMOGRAM WITH CAD AND ADJUNCT TOMO ULTRASOUND LEFT BREAST COMPARISON:  Previous exam(s). ACR Breast  Density Category c: The breast tissue is heterogeneously dense, which may obscure small masses. FINDINGS: Mammographically, there are no suspicious masses, areas of nonsurgical architectural distortion or microcalcifications in the left breast. Stable post biopsy and postsurgical changes in the left breast with architectural distortion in the subareolar left breast. Mammographic images were processed with CAD. On physical exam, no suspicious masses are palpated. Targeted ultrasound is performed, showing stable mildly prominent hyperechoic area in the left retroareolar 11 o'clock breast, which may represent distorted glandular tissue from prior excisional biopsy. No suspicious masses are seen. IMPRESSION: No mammographic or sonographic evidence of malignancy in the left breast. Stable postsurgical and post biopsy changes in the left breast. RECOMMENDATION: The patient is due for a bilateral screening mammogram in July 2019, to restore her annual screening schedule. I have discussed the findings and recommendations with the patient. Results were also provided in writing at the conclusion of the visit. If applicable, a reminder letter will be sent  to the patient regarding the next appointment. BI-RADS CATEGORY  2: Benign. Electronically Signed   By: Fidela Salisbury M.D.   On: 10/14/2017 10:32    Assessment/Plan:  1. Nipple discharge 72 year old female with a history of breast biopsies for papillomas.  Doing very well.  Image results reviewed with the patient.  Discussed continuing self breast exams as well as the signs and symptoms of concerning nipple discharge.  She voiced understanding.  Per radiology recommendation she will have her bilateral screening mammogram July.  Discussed annual follow-up with the physician for a clinical breast exam.  She voiced understanding and will follow-up for her annual exams. - MM DIAG BREAST TOMO BILATERAL; Future - US BREAST LTD UNI LEFT INC AXILLA; Future - US BREAST  LTD UNI RIGHT INC AXILLA; Future  A total of 15 minutes was used on this encounter with greater than 50% of it used for counseling or coordination of care.   Clayburn Pert, MD FACS General Surgeon  10/15/2017,9:13 AM

## 2017-10-15 NOTE — Patient Instructions (Addendum)
Please see your follow up appointments listed below. I will call you later to schedule your follow up appointment in August for your breast exam and to discuss your Mammogram results.     Breast Self-Awareness Breast self-awareness means:  Knowing how your breasts look.  Knowing how your breasts feel.  Checking your breasts every month for changes.  Telling your doctor if you notice a change in your breasts.  Breast self-awareness allows you to notice a breast problem early while it is still small. How to do a breast self-exam One way to learn what is normal for your breasts and to check for changes is to do a breast self-exam. To do a breast self-exam: Look for Changes  1. Take off all the clothes above your waist. 2. Stand in front of a mirror in a room with good lighting. 3. Put your hands on your hips. 4. Push your hands down. 5. Look at your breasts and nipples in the mirror to see if one breast or nipple looks different than the other. Check to see if: ? The shape of one breast is different. ? The size of one breast is different. ? There are wrinkles, dips, and bumps in one breast and not the other. 6. Look at each breast for changes in your skin, such as: ? Redness. ? Scaly areas. 7. Look for changes in your nipples, such as: ? Liquid around the nipples. ? Bleeding. ? Dimpling. ? Redness. ? A change in where the nipples are. Feel for Changes 1. Lie on your back on the floor. 2. Feel each breast. To do this, follow these steps: ? Pick a breast to feel. ? Put the arm closest to that breast above your head. ? Use your other arm to feel the nipple area of your breast. Feel the area with the pads of your three middle fingers by making small circles with your fingers. For the first circle, press lightly. For the second circle, press harder. For the third circle, press even harder. ? Keep making circles with your fingers at the light, harder, and even harder pressures as  you move down your breast. Stop when you feel your ribs. ? Move your fingers a little toward the center of your body. ? Start making circles with your fingers again, this time going up until you reach your collarbone. ? Keep making up and down circles until you reach your armpit. Remember to keep using the three pressures. ? Feel the other breast in the same way. 3. Sit or stand in the shower or tub. 4. With soapy water on your skin, feel each breast the same way you did in step 2, when you were lying on the floor. Write Down What You Find  After doing the self-exam, write down:  What is normal for each breast.  Any changes you find in each breast.  When you last had your period.  How often should I check my breasts? Check your breasts every month. If you are breastfeeding, the best time to check them is after you feed your baby or after you use a breast pump. If you get periods, the best time to check your breasts is 5-7 days after your period is over. When should I see my doctor? See your doctor if you notice:  A change in shape or size of your breasts or nipples.  A change in the skin of your breast or nipples, such as red or scaly skin.  Unusual fluid coming  from your nipples.  A lump or thick area that was not there before.  Pain in your breasts.  Anything that concerns you.  This information is not intended to replace advice given to you by your health care provider. Make sure you discuss any questions you have with your health care provider. Document Released: 02/18/2008 Document Revised: 02/07/2016 Document Reviewed: 07/22/2015 Elsevier Interactive Patient Education  [employer].

## 2018-01-22 IMAGING — MG US BREAST BX W LOC DEV 1ST LESION IMG BX SPEC US GUIDE*L*
1 series · 8 of 8 positions shown · non-contrast
Comparison: Previous exam(s).

ADDENDUM:
Pathology results: Pathology results from the ultrasound-guided
biopsy of the masses in the left breast:

- left breast 3 o'clock 2 cm from the nipple: Benign breast tissue
with hyalinized stroma and cyst formation, calcifications associated
with benign glands. This is concordant with the imaging findings.
- left breast 3 o'clock 5 cm from nipple: Benign breast tissue with
duct ectasia, where calcifications associated with benign glands.
This is concordant with the imaging findings.
- left breast 10 o'clock 1 cm from the nipple: Intraductal papilloma
with calcifications. This is considered concordant with the imaging
findings.
Excision is recommended for the papilloma in the left breast at 10
o'clock 1 cm from the nipple. The patient has a follow-up
appointment with surgeon, Dr. Gyotoku Deyama, 04/28/2016 to
discuss her biopsy results.
The patient has been notified of the results. She is doing well and
denies any biopsy site complications. The patient has been
instructed to call the [REDACTED] with any questions or concerns.
CLINICAL DATA: 69-year-old female with spontaneous left nipple
discharge with indeterminate left breast masses, at 3 o'clock 2 cm
from the nipple, at 3 o'clock 5 cm from the nipple and at 10 o'clock
1 cm from the nipple.
EXAM:
ULTRASOUND GUIDED LEFT BREAST CORE NEEDLE BIOPSY

[Series 1: MG view · 0.06mm/px · 8 of 30 slices shown]
[im 1/30]
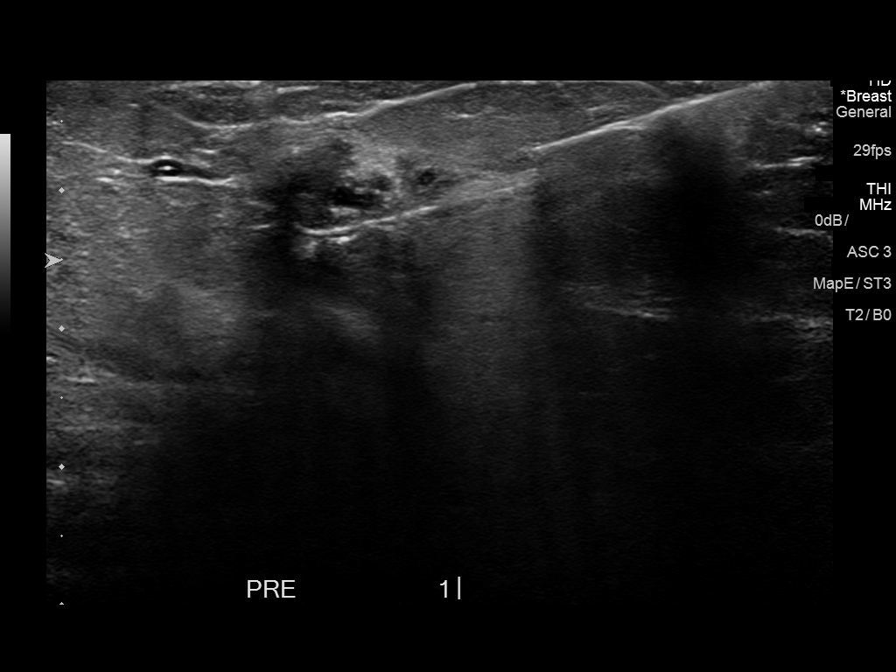
[im 5/30]
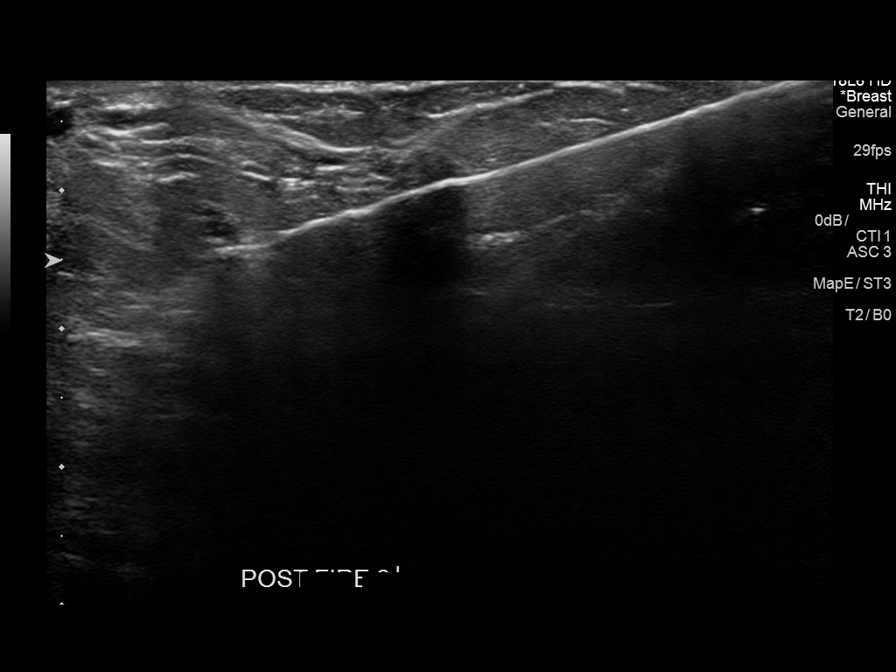
[im 9/30]
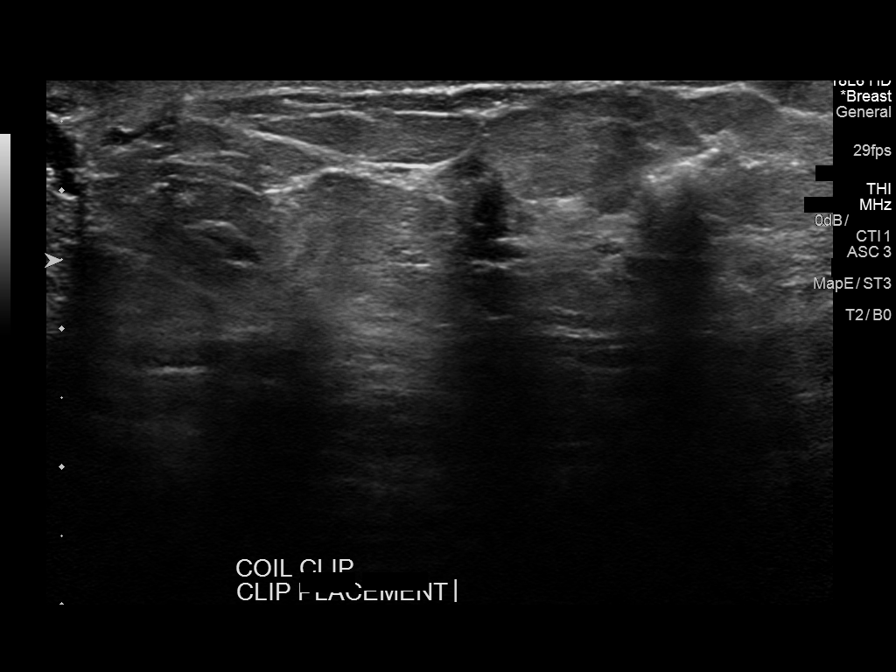
[im 13/30]
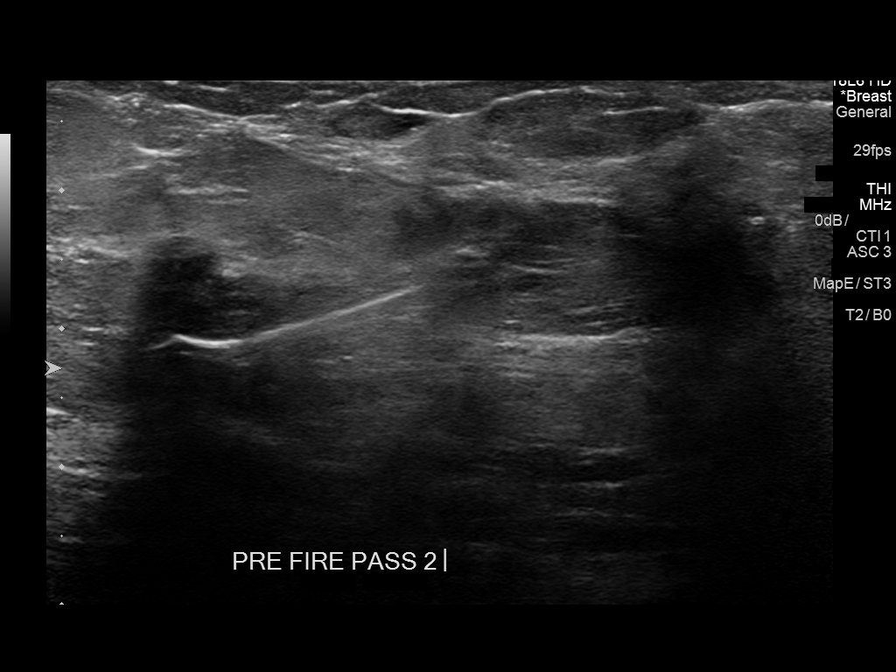
[im 17/30]
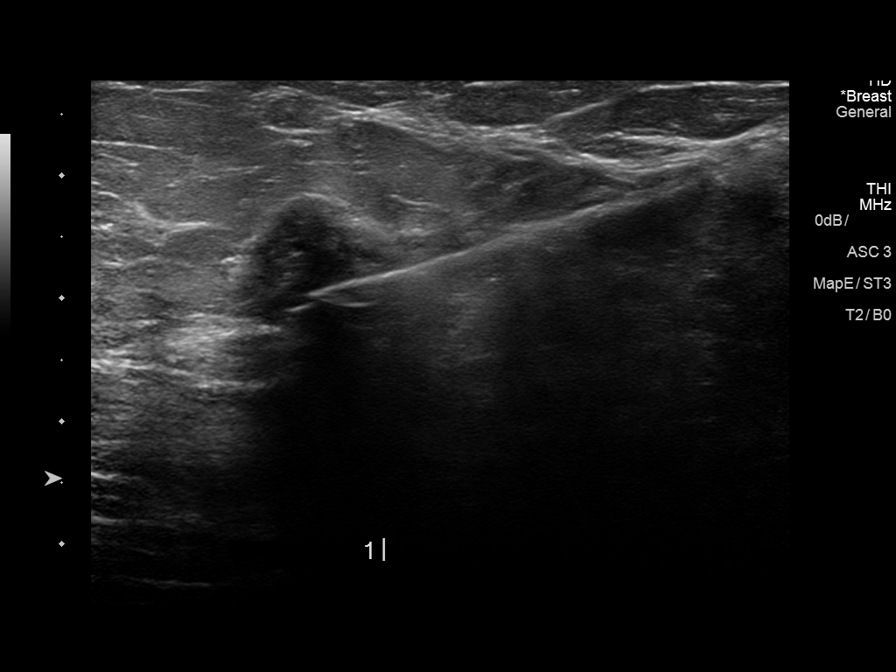
[im 21/30]
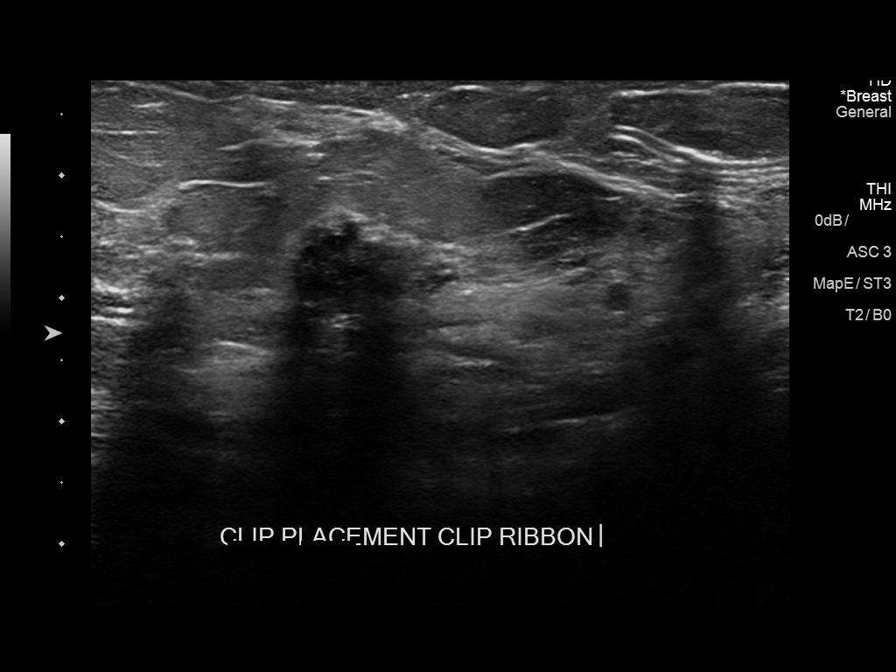
[im 25/30]
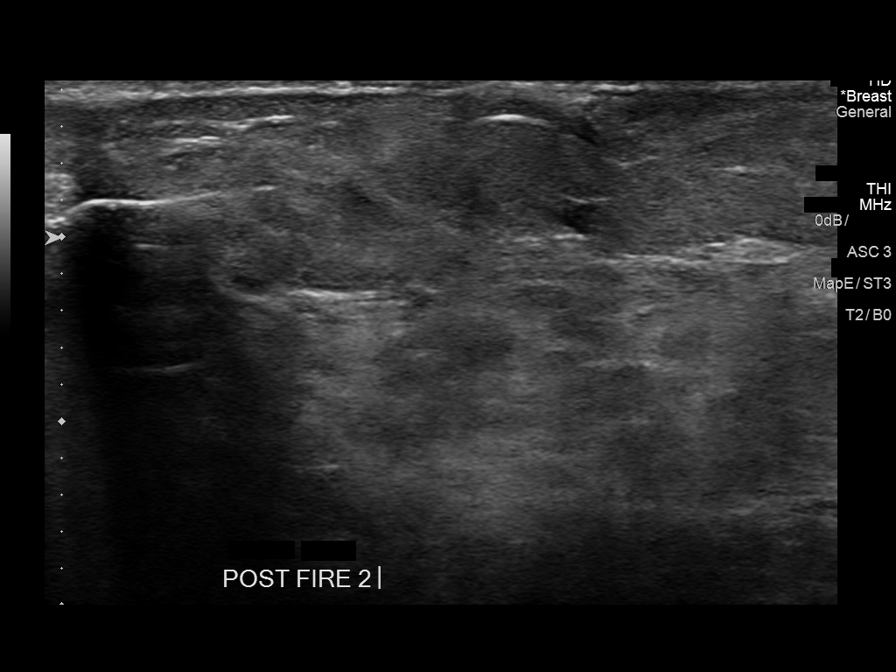
[im 30/30]
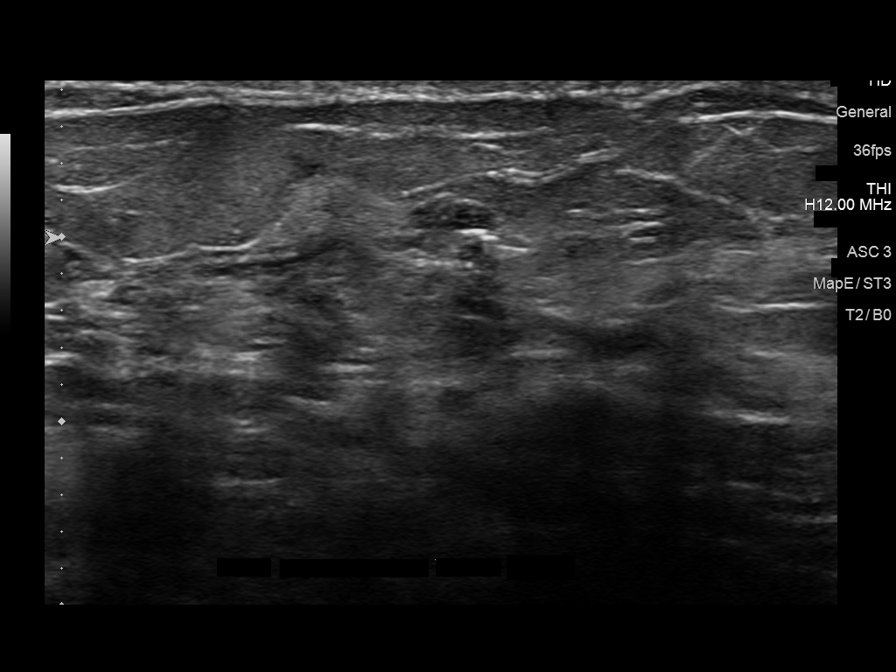

[8 of 8 positions shown; findings below may reference images not displayed]



SITE 1: LEFT BREAST MASS 3 O'CLOCK 2 CM FROM NIPPLE: Using sterile
technique and 1% Lidocaine as local anesthetic, under direct
ultrasound visualization, a 12 gauge Schlack device was used
to perform biopsy of the mass in the left breast at 3 o'clock 2 cm
from nipple using a lateral to medial approach. At the conclusion of
the procedure a coil shaped tissue marker clip was deployed into the
biopsy cavity.

SITE 2: LEFT BREAST MASS 3 O'CLOCK 5 CM FROM NIPPLE: Using sterile
technique and 1% Lidocaine as local anesthetic, under direct
ultrasound visualization, a 12 gauge Schlack device was used
to perform biopsy of the mass in the left breast at 3 o'clock 5 cm
from the nipple using a lateral to medial approach. At the
conclusion of the procedure a ribbon shaped tissue marker clip was
deployed into the biopsy cavity.

SITE 3: LEFT BREAST MASS 10 O'CLOCK 1 CM FROM THE NIPPLE: Using
sterile technique and 1% Lidocaine as local anesthetic, under direct
ultrasound visualization, a 12 gauge Schlack device was used
to perform biopsy of the mass in the left breast at 10 o'clock 1 cm
from the nipple using a lateral to medial approach. At the
conclusion of the procedure a wing shaped tissue marker clip was
deployed into the biopsy cavity.

Follow up 2 view mammogram was performed and dictated separately.
IMPRESSION: 1. Ultrasound-guided biopsy of left breast mass 3 o'clock 2 cm from
nipple, at site of coil shaped biopsy marking clip.

2. Ultrasound-guided biopsy of left breast mass at 3 o'clock 5 cm
from nipple, at site of ribbon shaped biopsy marking clip.

3. Ultrasound-guided biopsy of left breast mass at 10 o'clock 1 cm
from the nipple, at site of wing shaped biopsy marking clip.

## 2018-04-13 ENCOUNTER — Ambulatory Visit
Admission: RE | Admit: 2018-04-13 | Discharge: 2018-04-13 | Disposition: A | Discharge status: Home / Self Care | Payer: Medicare Other | Source: Ambulatory Visit | Attending: General Surgery | Admitting: General Surgery

## 2018-04-13 DIAGNOSIS — Z1231 Encounter for screening mammogram for malignant neoplasm of breast: Secondary | ICD-10-CM | POA: Diagnosis present

## 2018-04-14 ENCOUNTER — Telehealth: Payer: Self-pay

## 2018-04-14 NOTE — Telephone Encounter (Signed)
Left message to return call regarding Normal Mammogram results. Follow up appointment 04/22/18.

## 2018-04-15 NOTE — Telephone Encounter (Signed)
Patient notified of normal mammogram and appointment reminder mailed today for 04/22/18 follow up.

## 2018-04-20 ENCOUNTER — Other Ambulatory Visit: Payer: Self-pay

## 2018-04-22 ENCOUNTER — Encounter: Payer: Self-pay | Admitting: Surgery

## 2018-04-22 ENCOUNTER — Ambulatory Visit (INDEPENDENT_AMBULATORY_CARE_PROVIDER_SITE_OTHER): Payer: Medicare Other | Admitting: Surgery

## 2018-04-22 VITALS — BP 176/85 | HR 98 | Temp 97.7°F | Ht 63.0 in | Wt 160.0 lb

## 2018-04-22 DIAGNOSIS — Z9289 Personal history of other medical treatment: Secondary | ICD-10-CM | POA: Diagnosis not present

## 2018-04-22 NOTE — Progress Notes (Signed)
Surgical Clinic Progress/Follow-up Note   HPI:  72 y.o. Female presents to clinic for follow-up evaluation following recent B/L screening mammogram 2 years s/p excisional biopsy of Left intraductal papilloma ([place], 05/15/2016) performed for nipple discharge. Patient reports "occasional" "few drops" of clear fluid from nipples after showering, denies any bloody or milky discharge, palpable mass, breast pain, unintentional weight loss, fever/chills, CP, or SOB.  Review of Systems:  Constitutional: denies any other weight loss, fever, chills, or sweats  Eyes: denies any other vision changes, history of eye injury  ENT: denies sore throat, hearing problems  Respiratory: denies shortness of breath, wheezing  Cardiovascular: denies chest pain, palpitations Breasts: as per HPI Gastrointestinal: denies abdominal pain, N/V, or diarrhea Musculoskeletal: denies any other joint pains or cramps  Skin: Denies any other rashes or skin discolorations  Neurological: denies any other headache, dizziness, weakness  Psychiatric: denies any other depression, anxiety  All other review of systems: otherwise negative   Vital Signs:  BP (!) 176/85   Pulse 98   Temp 97.7 F (36.5 C) (Oral)   Ht 5\' 3"  (1.6 m)   Wt 160 lb (72.6 kg)   BMI 28.34 kg/m    Physical Exam:  Constitutional:  -- Overweight body habitus  -- Awake, alert, and oriented x3  Eyes:  -- Pupils equally round and reactive to light  -- No scleral icterus  Ear, nose, throat:  -- No jugular venous distension -- No nasal drainage, bleeding Pulmonary:  -- No crackles -- Equal breath sounds bilaterally -- Breathing non-labored at rest Cardiovascular:  -- S1, S2 present  -- No pericardial rubs  Breasts: -- B/L no palpable mass, no nipple discharge -- Non-tender with no palpable axillary lymphadenopathy -- Well-healed post-surgical excisional biopsy scars Gastrointestinal:  -- Soft, nontender, non-distended, no guarding/rebound   -- No abdominal masses appreciated, pulsatile or otherwise  Musculoskeletal / Integumentary:  -- Wounds or skin discoloration: None appreciated  -- Extremities: B/L UE and LE FROM, hands and feet warm  Neurologic:  -- Motor function: intact and symmetric  -- Sensation: intact and symmetric   Laboratory studies:  CBC:  Lab Results  Component Value Date   WBC 11.2 (H) 05/06/2016   RBC 4.99 05/06/2016   BMP:  Lab Results  Component Value Date   GLUCOSE 102 (H) 05/06/2016   CO2 25 05/06/2016   BUN 16 05/06/2016   CREATININE 0.74 05/06/2016   CREATININE 0.61 06/14/2012   CALCIUM 9.6 05/06/2016     Imaging:  Bilateral Screening Mammogram (04/13/2018) ACR Breast Density Category b:  There are scattered areas of fibroglandular density. There are no findings suspicious for malignancy. BI-RADS Category 1: negative  Assessment:  72 y.o. yo Female with a problem list including...  Patient Active Problem List   Diagnosis Date Noted  . Discharge from left nipple 01/28/2017  . Breast pain, left 12/03/2016  . Hypercholesteremia   . Fibrocystic breast disease   . Allergy   . Hx of migraines   . Vitamin D deficiency   . Acid reflux 03/13/2016  . Pure hypercholesterolemia 03/13/2016  . Type 2 diabetes mellitus ([place]) 08/21/2015  . Recurrent major depressive disorder, in partial remission ([place]) 08/21/2015  . Clinical depression 05/30/2014  . Anxiety 01/19/2014  . Avitaminosis D 01/19/2014    presents to clinic for  follow-up evaluation following recent B/L screening mammogram, doing well 2 years s/p excisional biopsy of Left intraductal papilloma ([place], 05/15/2016) performed for nipple discharge, though still smoking and still states interest  in smoking cessation.  Plan:   - results of recent mammography discussed with patient  - smoking cessation discussed at length and strongly encouraged  - return to clinic following bilateral mammogram in 1 year  - instructed to call office  if any questions or concerns  All of the above recommendations were discussed with the patient, and all of patient's questions were answered to her expressed satisfaction.  -- Marilynne Drivers Rosana Hoes, MD, [place]: [facility] - Partnering for exceptional care. Office: [phone]

## 2018-04-22 NOTE — Patient Instructions (Addendum)
We will call you July 2020 to schedule your Mammogram and follow up appointment.     Breast Self-Awareness Breast self-awareness means:  Knowing how your breasts look.  Knowing how your breasts feel.  Checking your breasts every month for changes.  Telling your doctor if you notice a change in your breasts.  Breast self-awareness allows you to notice a breast problem early while it is still small. How to do a breast self-exam One way to learn what is normal for your breasts and to check for changes is to do a breast self-exam. To do a breast self-exam: Look for Changes  1. Take off all the clothes above your waist. 2. Stand in front of a mirror in a room with good lighting. 3. Put your hands on your hips. 4. Push your hands down. 5. Look at your breasts and nipples in the mirror to see if one breast or nipple looks different than the other. Check to see if: ? The shape of one breast is different. ? The size of one breast is different. ? There are wrinkles, dips, and bumps in one breast and not the other. 6. Look at each breast for changes in your skin, such as: ? Redness. ? Scaly areas. 7. Look for changes in your nipples, such as: ? Liquid around the nipples. ? Bleeding. ? Dimpling. ? Redness. ? A change in where the nipples are. Feel for Changes 1. Lie on your back on the floor. 2. Feel each breast. To do this, follow these steps: ? Pick a breast to feel. ? Put the arm closest to that breast above your head. ? Use your other arm to feel the nipple area of your breast. Feel the area with the pads of your three middle fingers by making small circles with your fingers. For the first circle, press lightly. For the second circle, press harder. For the third circle, press even harder. ? Keep making circles with your fingers at the light, harder, and even harder pressures as you move down your breast. Stop when you feel your ribs. ? Move your fingers a little toward the center  of your body. ? Start making circles with your fingers again, this time going up until you reach your collarbone. ? Keep making up and down circles until you reach your armpit. Remember to keep using the three pressures. ? Feel the other breast in the same way. 3. Sit or stand in the shower or tub. 4. With soapy water on your skin, feel each breast the same way you did in step 2, when you were lying on the floor. Write Down What You Find  After doing the self-exam, write down:  What is normal for each breast.  Any changes you find in each breast.  When you last had your period.  How often should I check my breasts? Check your breasts every month. If you are breastfeeding, the best time to check them is after you feed your baby or after you use a breast pump. If you get periods, the best time to check your breasts is 5-7 days after your period is over. When should I see my doctor? See your doctor if you notice:  A change in shape or size of your breasts or nipples.  A change in the skin of your breast or nipples, such as red or scaly skin.  Unusual fluid coming from your nipples.  A lump or thick area that was not there before.  Pain in  your breasts.  Anything that concerns you.  This information is not intended to replace advice given to you by your health care provider. Make sure you discuss any questions you have with your health care provider. Document Released: 02/18/2008 Document Revised: 02/07/2016 Document Reviewed: 07/22/2015 Elsevier Interactive Patient Education  2018 [place] with Quitting Smoking Quitting smoking is a physical and mental challenge. You will face cravings, withdrawal symptoms, and temptation. Before quitting, work with your health care provider to make a plan that can help you cope. Preparation can help you quit and keep you from giving in. How can I cope with cravings? Cravings usually last for 5-10 minutes. If you get through it, the  craving will pass. Consider taking the following actions to help you cope with cravings:  Keep your mouth busy: ? Chew sugar-free gum. ? Suck on hard candies or a straw. ? Brush your teeth.  Keep your hands and body busy: ? Immediately change to a different activity when you feel a craving. ? Squeeze or play with a ball. ? Do an activity or a hobby, like making bead jewelry, practicing needlepoint, or working with wood. ? Mix up your normal routine. ? Take a short exercise break. Go for a quick walk or run up and down stairs. ? Spend time in public places where smoking is not allowed.  Focus on doing something kind or helpful for someone else.  Call a friend or family member to talk during a craving.  Join a support group.  Call a quit line, such as 1-800-QUIT-NOW.  Talk with your health care provider about medicines that might help you cope with cravings and make quitting easier for you.  How can I deal with withdrawal symptoms? Your body may experience negative effects as it tries to get used to not having nicotine in the system. These effects are called withdrawal symptoms. They may include:  Feeling hungrier than normal.  Trouble concentrating.  Irritability.  Trouble sleeping.  Feeling depressed.  Restlessness and agitation.  Craving a cigarette.  To manage withdrawal symptoms:  Avoid places, people, and activities that trigger your cravings.  Remember why you want to quit.  Get plenty of sleep.  Avoid coffee and other caffeinated drinks. These may worsen some of your symptoms.  How can I handle social situations? Social situations can be difficult when you are quitting smoking, especially in the first few weeks. To manage this, you can:  Avoid parties, bars, and other social situations where people might be smoking.  Avoid alcohol.  Leave right away if you have the urge to smoke.  Explain to your family and friends that you are quitting smoking. Ask  for understanding and support.  Plan activities with friends or family where smoking is not an option.  What are some ways I can cope with stress? Wanting to smoke may cause stress, and stress can make you want to smoke. Find ways to manage your stress. Relaxation techniques can help. For example:  Breathe slowly and deeply, in through your nose and out through your mouth.  Listen to soothing, relaxing music.  Talk with a family member or friend about your stress.  Light a candle.  Soak in a bath or take a shower.  Think about a peaceful place.  What are some ways I can prevent weight gain? Be aware that many people gain weight after they quit smoking. However, not everyone does. To keep from gaining weight, have a plan in place  before you quit and stick to the plan after you quit. Your plan should include:  Having healthy snacks. When you have a craving, it may help to: ? Eat plain popcorn, crunchy carrots, celery, or other cut vegetables. ? Chew sugar-free gum.  Changing how you eat: ? Eat small portion sizes at meals. ? Eat 4-6 small meals throughout the day instead of 1-2 large meals a day. ? Be mindful when you eat. Do not watch television or do other things that might distract you as you eat.  Exercising regularly: ? Make time to exercise each day. If you do not have time for a long workout, do short bouts of exercise for 5-10 minutes several times a day. ? Do some form of strengthening exercise, like weight lifting, and some form of aerobic exercise, like running or swimming.  Drinking plenty of water or other low-calorie or no-calorie drinks. Drink 6-8 glasses of water daily, or as much as instructed by your health care provider.  Summary  Quitting smoking is a physical and mental challenge. You will face cravings, withdrawal symptoms, and temptation to smoke again. Preparation can help you as you go through these challenges.  You can cope with cravings by keeping  your mouth busy (such as by chewing gum), keeping your body and hands busy, and making calls to family, friends, or a helpline for people who want to quit smoking.  You can cope with withdrawal symptoms by avoiding places where people smoke, avoiding drinks with caffeine, and getting plenty of rest.  Ask your health care provider about the different ways to prevent weight gain, avoid stress, and handle social situations. This information is not intended to replace advice given to you by your health care provider. Make sure you discuss any questions you have with your health care provider. Document Released: 08/29/2016 Document Revised: 08/29/2016 Document Reviewed: 08/29/2016 Elsevier Interactive Patient Education  [employer].

## 2018-08-04 ENCOUNTER — Other Ambulatory Visit: Payer: Self-pay

## 2018-08-04 ENCOUNTER — Encounter: Payer: Self-pay | Admitting: *Deleted

## 2018-08-05 NOTE — Discharge Instructions (Signed)

## 2018-08-10 ENCOUNTER — Ambulatory Visit
Admission: RE | Admit: 2018-08-10 | Discharge: 2018-08-10 | Disposition: A | Discharge status: Home / Self Care | Payer: Medicare Other | Source: Ambulatory Visit | Attending: Ophthalmology | Admitting: Ophthalmology

## 2018-08-10 ENCOUNTER — Ambulatory Visit: Payer: Medicare Other | Admitting: Anesthesiology

## 2018-08-10 ENCOUNTER — Encounter
Admission: RE | Disposition: A | Discharge status: Home / Self Care | Payer: Self-pay | Source: Ambulatory Visit | Attending: Ophthalmology

## 2018-08-10 DIAGNOSIS — Z79899 Other long term (current) drug therapy: Secondary | ICD-10-CM | POA: Insufficient documentation

## 2018-08-10 DIAGNOSIS — I10 Essential (primary) hypertension: Secondary | ICD-10-CM | POA: Diagnosis not present

## 2018-08-10 DIAGNOSIS — F329 Major depressive disorder, single episode, unspecified: Secondary | ICD-10-CM | POA: Diagnosis not present

## 2018-08-10 DIAGNOSIS — F17210 Nicotine dependence, cigarettes, uncomplicated: Secondary | ICD-10-CM | POA: Insufficient documentation

## 2018-08-10 DIAGNOSIS — H2512 Age-related nuclear cataract, left eye: Secondary | ICD-10-CM | POA: Insufficient documentation

## 2018-08-10 DIAGNOSIS — J45909 Unspecified asthma, uncomplicated: Secondary | ICD-10-CM | POA: Diagnosis not present

## 2018-08-10 DIAGNOSIS — E7800 Pure hypercholesterolemia, unspecified: Secondary | ICD-10-CM | POA: Insufficient documentation

## 2018-08-10 DIAGNOSIS — E1136 Type 2 diabetes mellitus with diabetic cataract: Secondary | ICD-10-CM | POA: Insufficient documentation

## 2018-08-10 HISTORY — DX: Dizziness and giddiness: R42

## 2018-08-10 HISTORY — DX: Presence of dental prosthetic device (complete) (partial): Z97.2

## 2018-08-10 HISTORY — PX: CATARACT EXTRACTION W/PHACO: SHX586

## 2018-08-10 SURGERY — PHACOEMULSIFICATION, CATARACT, WITH IOL INSERTION
Anesthesia: Monitor Anesthesia Care | Laterality: Left

## 2018-08-10 MED ORDER — FENTANYL CITRATE (PF) 100 MCG/2ML IJ SOLN
INTRAMUSCULAR | Status: DC | PRN
  Administered 2018-08-10 (×2): 50 ug via INTRAVENOUS

## 2018-08-10 MED ORDER — ACETAMINOPHEN 160 MG/5ML PO SOLN: 325.0000 mg | Freq: Once | ORAL | Status: DC

## 2018-08-10 MED ORDER — MOXIFLOXACIN HCL 0.5 % OP SOLN
OPHTHALMIC | Status: DC | PRN
  Administered 2018-08-10: 0.2 mL via OPHTHALMIC

## 2018-08-10 MED ORDER — EPINEPHRINE PF 1 MG/ML IJ SOLN
INTRAOCULAR | Status: DC | PRN
  Administered 2018-08-10: 74 mL via OPHTHALMIC

## 2018-08-10 MED ORDER — TETRACAINE HCL 0.5 % OP SOLN
1.0000 [drp] | OPHTHALMIC | Status: DC | PRN
  Administered 2018-08-10 (×2): 1 [drp] via OPHTHALMIC

## 2018-08-10 MED ORDER — ACETAMINOPHEN 325 MG PO TABS: 325.0000 mg | ORAL_TABLET | Freq: Once | ORAL | Status: DC

## 2018-08-10 MED ORDER — ARMC OPHTHALMIC DILATING DROPS
1.0000 | OPHTHALMIC | Status: DC | PRN
Start: 2018-08-10 — End: 2018-08-10
  Administered 2018-08-10 (×3): 1 via OPHTHALMIC

## 2018-08-10 MED ORDER — NA HYALUR & NA CHOND-NA HYALUR 0.4-0.35 ML IO KIT
PACK | INTRAOCULAR | Status: DC | PRN
  Administered 2018-08-10: 1 mL via INTRAOCULAR

## 2018-08-10 MED ORDER — MOXIFLOXACIN HCL 0.5 % OP SOLN
1.0000 [drp] | OPHTHALMIC | Status: DC | PRN
  Administered 2018-08-10 (×3): 1 [drp] via OPHTHALMIC

## 2018-08-10 MED ORDER — LIDOCAINE HCL (PF) 2 % IJ SOLN
INTRAOCULAR | Status: DC | PRN
  Administered 2018-08-10: 1 mL via INTRAMUSCULAR

## 2018-08-10 MED ORDER — BRIMONIDINE TARTRATE-TIMOLOL 0.2-0.5 % OP SOLN
OPHTHALMIC | Status: DC | PRN
  Administered 2018-08-10: 1 [drp] via OPHTHALMIC

## 2018-08-10 MED ORDER — LACTATED RINGERS IV SOLN: INTRAVENOUS | Status: DC

## 2018-08-10 MED ORDER — MIDAZOLAM HCL 2 MG/2ML IJ SOLN
INTRAMUSCULAR | Status: DC | PRN
  Administered 2018-08-10: 2 mg via INTRAVENOUS

## 2018-08-10 SURGICAL SUPPLY — 27 items
CANNULA ANT/CHMB 27G (MISCELLANEOUS) ×1 IMPLANT
CANNULA ANT/CHMB 27GA (MISCELLANEOUS) ×3 IMPLANT
CARTRIDGE ABBOTT (MISCELLANEOUS) IMPLANT
GLOVE SURG LX 7.5 STRW (GLOVE) ×2
GLOVE SURG LX STRL 7.5 STRW (GLOVE) ×1 IMPLANT
GLOVE SURG TRIUMPH 8.0 PF LTX (GLOVE) ×3 IMPLANT
GOWN STRL REUS W/ TWL LRG LVL3 (GOWN DISPOSABLE) ×2 IMPLANT
GOWN STRL REUS W/TWL LRG LVL3 (GOWN DISPOSABLE) ×4
LENS IOL TECNIS ITEC 15.5 (Intraocular Lens) ×2 IMPLANT
MARKER SKIN DUAL TIP RULER LAB (MISCELLANEOUS) ×3 IMPLANT
NDL FILTER BLUNT 18X1 1/2 (NEEDLE) ×1 IMPLANT
NDL RETROBULBAR .5 NSTRL (NEEDLE) IMPLANT
NEEDLE FILTER BLUNT 18X 1/2SAF (NEEDLE) ×2
NEEDLE FILTER BLUNT 18X1 1/2 (NEEDLE) ×1 IMPLANT
PACK CATARACT BRASINGTON (MISCELLANEOUS) ×3 IMPLANT
PACK EYE AFTER SURG (MISCELLANEOUS) ×3 IMPLANT
PACK OPTHALMIC (MISCELLANEOUS) ×3 IMPLANT
RING MALYGIN 7.0 (MISCELLANEOUS) IMPLANT
SUT ETHILON 10-0 CS-B-6CS-B-6 (SUTURE)
SUT VICRYL  9 0 (SUTURE)
SUT VICRYL 9 0 (SUTURE) IMPLANT
SUTURE EHLN 10-0 CS-B-6CS-B-6 (SUTURE) IMPLANT
SYR 3ML LL SCALE MARK (SYRINGE) ×3 IMPLANT
SYR 5ML LL (SYRINGE) ×3 IMPLANT
SYR TB 1ML LUER SLIP (SYRINGE) ×3 IMPLANT
WATER STERILE IRR 500ML POUR (IV SOLUTION) ×3 IMPLANT
WIPE NON LINTING 3.25X3.25 (MISCELLANEOUS) ×3 IMPLANT

## 2018-08-10 NOTE — Transfer of Care (Signed)
Immediate Anesthesia Transfer of Care Note  Patient: Diane Anderson  Procedure(s) Performed: CATARACT EXTRACTION PHACO AND INTRAOCULAR LENS PLACEMENT (IOC)  LEFT DIABETIC (Left )  Patient Location: PACU  Anesthesia Type: MAC  Level of Consciousness: awake, alert  and patient cooperative  Airway and Oxygen Therapy: Patient Spontanous Breathing and Patient connected to supplemental oxygen  Post-op Assessment: Post-op Vital signs reviewed, Patient's Cardiovascular Status Stable, Respiratory Function Stable, Patent Airway and No signs of Nausea or vomiting  Post-op Vital Signs: Reviewed and stable  Complications: No apparent anesthesia complications

## 2018-08-10 NOTE — Op Note (Signed)
OPERATIVE NOTE  Diane Anderson [id number] 08/10/2018   PREOPERATIVE DIAGNOSIS:  Nuclear sclerotic cataract left eye. H25.12   POSTOPERATIVE DIAGNOSIS:    Nuclear sclerotic cataract left eye.     PROCEDURE:  Phacoemusification with posterior chamber intraocular lens placement of the left eye   LENS:   Implant Name Type Inv. Item Serial No. Manufacturer Lot No. LRB No. Used  LENS IOL DIOP 15.5 - [id number] Intraocular Lens LENS IOL DIOP 15.5 [id number]  Left 1        ULTRASOUND TIME: 22  % of 1 minutes 24 seconds, CDE 20.0  SURGEON:  Wyonia Hough, MD   ANESTHESIA:  Topical with tetracaine drops and 2% Xylocaine jelly, augmented with 1% preservative-free intracameral lidocaine.    COMPLICATIONS:  None.   DESCRIPTION OF PROCEDURE:  The patient was identified in the holding room and transported to the operating room and placed in the supine position under the operating microscope.  The left eye was identified as the operative eye and it was prepped and draped in the usual sterile ophthalmic fashion.   A 1 millimeter clear-corneal paracentesis was made at the 1:30 position.  0.5 ml of preservative-free 1% lidocaine was injected into the anterior chamber.  The anterior chamber was filled with Viscoat viscoelastic.  A 2.4 millimeter keratome was used to make a near-clear corneal incision at the 10:30 position.  .  A curvilinear capsulorrhexis was made with a cystotome and capsulorrhexis forceps.  Balanced salt solution was used to hydrodissect and hydrodelineate the nucleus.   Phacoemulsification was then used in stop and chop fashion to remove the lens nucleus and epinucleus.  The remaining cortex was then removed using the irrigation and aspiration handpiece. Provisc was then placed into the capsular bag to distend it for lens placement.  A lens was then injected into the capsular bag.  The remaining viscoelastic was aspirated.   Wounds were hydrated with balanced salt  solution.  The anterior chamber was inflated to a physiologic pressure with balanced salt solution.  No wound leaks were noted. Vigamox 0.2 ml of a 1mg  per ml solution was injected into the anterior chamber for a dose of 0.2 mg of intracameral antibiotic at the completion of the case.   Timolol and Brimonidine drops were applied to the eye.  The patient was taken to the recovery room in stable condition without complications of anesthesia or surgery.  Diane Anderson 08/10/2018, 8:10 AM

## 2018-08-10 NOTE — H&P (Signed)
The History and Physical notes are on paper, have been signed, and are to be scanned. The patient remains stable and unchanged from the H&P.   Previous H&P reviewed, patient examined, and there are no changes.  Diane Anderson 08/10/2018 7:22 AM

## 2018-08-10 NOTE — Anesthesia Procedure Notes (Signed)
Procedure Name: MAC Date/Time: 08/10/2018 7:57 AM Performed by: Jeannene Patella, CRNA Pre-anesthesia Checklist: Patient identified, Emergency Drugs available, Suction available, Patient being monitored and Timeout performed Patient Re-evaluated:Patient Re-evaluated prior to induction Oxygen Delivery Method: Nasal cannula Induction Type: IV induction

## 2018-08-10 NOTE — Anesthesia Postprocedure Evaluation (Signed)
Anesthesia Post Note  Patient: Diane Anderson  Procedure(s) Performed: CATARACT EXTRACTION PHACO AND INTRAOCULAR LENS PLACEMENT (IOC)  LEFT DIABETIC (Left )  Patient location during evaluation: PACU Anesthesia Type: MAC Level of consciousness: awake and alert and oriented Pain management: satisfactory to patient Vital Signs Assessment: post-procedure vital signs reviewed and stable Respiratory status: spontaneous breathing, nonlabored ventilation and respiratory function stable Cardiovascular status: blood pressure returned to baseline and stable Postop Assessment: Adequate PO intake and No signs of nausea or vomiting Anesthetic complications: no    Raliegh Ip

## 2018-08-10 NOTE — Anesthesia Preprocedure Evaluation (Signed)
Anesthesia Evaluation  Patient identified by MRN, date of birth, ID band Patient awake    Reviewed: Allergy & Precautions, H&P , NPO status , Patient's Chart, lab work & pertinent test results  Airway Mallampati: III  TM Distance: <3 FB Neck ROM: full    Dental no notable dental hx.    Pulmonary asthma , Current Smoker,    Pulmonary exam normal breath sounds clear to auscultation       Cardiovascular Normal cardiovascular exam Rhythm:regular Rate:Normal     Neuro/Psych    GI/Hepatic GERD  ,  Endo/Other  diabetes  Renal/GU      Musculoskeletal   Abdominal   Peds  Hematology   Anesthesia Other Findings   Reproductive/Obstetrics                             Anesthesia Physical Anesthesia Plan  ASA: II  Anesthesia Plan: MAC   Post-op Pain Management:    Induction:   PONV Risk Score and Plan: 2 and Midazolam and Treatment may vary due to age or medical condition  Airway Management Planned:   Additional Equipment:   Intra-op Plan:   Post-operative Plan:   Informed Consent: I have reviewed the patients History and Physical, chart, labs and discussed the procedure including the risks, benefits and alternatives for the proposed anesthesia with the patient or authorized representative who has indicated his/her understanding and acceptance.     Plan Discussed with: CRNA  Anesthesia Plan Comments:         Anesthesia Quick Evaluation

## 2018-08-25 ENCOUNTER — Other Ambulatory Visit: Payer: Self-pay

## 2018-08-25 ENCOUNTER — Encounter: Payer: Self-pay | Admitting: *Deleted

## 2018-08-25 NOTE — Anesthesia Preprocedure Evaluation (Addendum)
Anesthesia Evaluation  Patient identified by MRN, date of birth, ID band Patient awake    Reviewed: Allergy & Precautions, NPO status , Patient's Chart, lab work & pertinent test results  History of Anesthesia Complications Negative for: history of anesthetic complications  Airway Mallampati: III   Neck ROM: Full    Dental  (+)    Pulmonary asthma , Current Smoker (1 ppd),    Pulmonary exam normal breath sounds clear to auscultation       Cardiovascular Exercise Tolerance: Good negative cardio ROS Normal cardiovascular exam Rhythm:Regular Rate:Normal     Neuro/Psych  Headaches, PSYCHIATRIC DISORDERS Anxiety Depression Vertigo     GI/Hepatic GERD  ,  Endo/Other  diabetes, Type 2  Renal/GU negative Renal ROS     Musculoskeletal  (+) Arthritis ,   Abdominal   Peds  Hematology negative hematology ROS (+)   Anesthesia Other Findings   Reproductive/Obstetrics                            Anesthesia Physical Anesthesia Plan  ASA: III  Anesthesia Plan: MAC   Post-op Pain Management:    Induction: Intravenous  PONV Risk Score and Plan: 1 and Midazolam and TIVA  Airway Management Planned: Natural Airway  Additional Equipment:   Intra-op Plan:   Post-operative Plan:   Informed Consent: I have reviewed the patients History and Physical, chart, labs and discussed the procedure including the risks, benefits and alternatives for the proposed anesthesia with the patient or authorized representative who has indicated his/her understanding and acceptance.     Plan Discussed with: CRNA  Anesthesia Plan Comments:        Anesthesia Quick Evaluation

## 2018-08-26 NOTE — Discharge Instructions (Signed)

## 2018-08-31 ENCOUNTER — Ambulatory Visit: Payer: Medicare Other | Admitting: Anesthesiology

## 2018-08-31 ENCOUNTER — Encounter
Admission: RE | Disposition: A | Discharge status: Home / Self Care | Payer: Self-pay | Source: Home / Self Care | Attending: Ophthalmology

## 2018-08-31 ENCOUNTER — Ambulatory Visit
Admission: RE | Admit: 2018-08-31 | Discharge: 2018-08-31 | Disposition: A | Discharge status: Home / Self Care | Payer: Medicare Other | Attending: Ophthalmology | Admitting: Ophthalmology

## 2018-08-31 DIAGNOSIS — I10 Essential (primary) hypertension: Secondary | ICD-10-CM | POA: Diagnosis not present

## 2018-08-31 DIAGNOSIS — F329 Major depressive disorder, single episode, unspecified: Secondary | ICD-10-CM | POA: Insufficient documentation

## 2018-08-31 DIAGNOSIS — K219 Gastro-esophageal reflux disease without esophagitis: Secondary | ICD-10-CM | POA: Insufficient documentation

## 2018-08-31 DIAGNOSIS — F17200 Nicotine dependence, unspecified, uncomplicated: Secondary | ICD-10-CM | POA: Diagnosis not present

## 2018-08-31 DIAGNOSIS — H2511 Age-related nuclear cataract, right eye: Secondary | ICD-10-CM | POA: Insufficient documentation

## 2018-08-31 DIAGNOSIS — Z79899 Other long term (current) drug therapy: Secondary | ICD-10-CM | POA: Insufficient documentation

## 2018-08-31 DIAGNOSIS — E7800 Pure hypercholesterolemia, unspecified: Secondary | ICD-10-CM | POA: Diagnosis not present

## 2018-08-31 HISTORY — PX: CATARACT EXTRACTION W/PHACO: SHX586

## 2018-08-31 SURGERY — PHACOEMULSIFICATION, CATARACT, WITH IOL INSERTION
Anesthesia: Monitor Anesthesia Care | Site: Eye | Laterality: Right

## 2018-08-31 MED ORDER — MIDAZOLAM HCL 2 MG/2ML IJ SOLN
INTRAMUSCULAR | Status: DC | PRN
  Administered 2018-08-31: 2 mg via INTRAVENOUS

## 2018-08-31 MED ORDER — TETRACAINE HCL 0.5 % OP SOLN
1.0000 [drp] | OPHTHALMIC | Status: DC | PRN
  Administered 2018-08-31 (×2): 1 [drp] via OPHTHALMIC

## 2018-08-31 MED ORDER — NA HYALUR & NA CHOND-NA HYALUR 0.4-0.35 ML IO KIT
PACK | INTRAOCULAR | Status: DC | PRN
  Administered 2018-08-31: 1 mL via INTRAOCULAR

## 2018-08-31 MED ORDER — LACTATED RINGERS IV SOLN: INTRAVENOUS | Status: DC

## 2018-08-31 MED ORDER — EPINEPHRINE PF 1 MG/ML IJ SOLN
INTRAOCULAR | Status: DC | PRN
  Administered 2018-08-31: 87 mL via OPHTHALMIC

## 2018-08-31 MED ORDER — ARMC OPHTHALMIC DILATING DROPS
1.0000 "application " | OPHTHALMIC | Status: DC | PRN
  Administered 2018-08-31 (×3): 1 via OPHTHALMIC

## 2018-08-31 MED ORDER — LIDOCAINE HCL (PF) 2 % IJ SOLN
INTRAOCULAR | Status: DC | PRN
  Administered 2018-08-31: .5 mL
  Administered 2018-08-31: 1 mL

## 2018-08-31 MED ORDER — BRIMONIDINE TARTRATE-TIMOLOL 0.2-0.5 % OP SOLN
OPHTHALMIC | Status: DC | PRN
  Administered 2018-08-31: 1 [drp] via OPHTHALMIC

## 2018-08-31 MED ORDER — MOXIFLOXACIN HCL 0.5 % OP SOLN
OPHTHALMIC | Status: DC | PRN
  Administered 2018-08-31: 1 [drp] via OPHTHALMIC

## 2018-08-31 MED ORDER — MOXIFLOXACIN HCL 0.5 % OP SOLN
1.0000 [drp] | OPHTHALMIC | Status: DC | PRN
  Administered 2018-08-31 (×3): 1 [drp] via OPHTHALMIC

## 2018-08-31 MED ORDER — FENTANYL CITRATE (PF) 100 MCG/2ML IJ SOLN
INTRAMUSCULAR | Status: DC | PRN
  Administered 2018-08-31 (×2): 50 ug via INTRAVENOUS

## 2018-08-31 SURGICAL SUPPLY — 26 items
CANNULA ANT/CHMB 27G (MISCELLANEOUS) ×1 IMPLANT
CANNULA ANT/CHMB 27GA (MISCELLANEOUS) ×3 IMPLANT
GLOVE SURG LX 7.5 STRW (GLOVE) ×2
GLOVE SURG LX STRL 7.5 STRW (GLOVE) ×1 IMPLANT
GLOVE SURG TRIUMPH 8.0 PF LTX (GLOVE) ×3 IMPLANT
GOWN STRL REUS W/ TWL LRG LVL3 (GOWN DISPOSABLE) ×2 IMPLANT
GOWN STRL REUS W/TWL LRG LVL3 (GOWN DISPOSABLE) ×4
LENS IOL TECNIS ITEC 15.5 (Intraocular Lens) ×2 IMPLANT
MARKER SKIN DUAL TIP RULER LAB (MISCELLANEOUS) ×3 IMPLANT
NDL FILTER BLUNT 18X1 1/2 (NEEDLE) ×1 IMPLANT
NDL RETROBULBAR .5 NSTRL (NEEDLE) IMPLANT
NEEDLE FILTER BLUNT 18X 1/2SAF (NEEDLE) ×2
NEEDLE FILTER BLUNT 18X1 1/2 (NEEDLE) ×1 IMPLANT
PACK CATARACT BRASINGTON (MISCELLANEOUS) ×3 IMPLANT
PACK EYE AFTER SURG (MISCELLANEOUS) ×3 IMPLANT
PACK OPTHALMIC (MISCELLANEOUS) ×3 IMPLANT
RING MALYGIN 7.0 (MISCELLANEOUS) IMPLANT
SUT ETHILON 10-0 CS-B-6CS-B-6 (SUTURE)
SUT VICRYL  9 0 (SUTURE)
SUT VICRYL 9 0 (SUTURE) IMPLANT
SUTURE EHLN 10-0 CS-B-6CS-B-6 (SUTURE) IMPLANT
SYR 3ML LL SCALE MARK (SYRINGE) ×3 IMPLANT
SYR 5ML LL (SYRINGE) ×3 IMPLANT
SYR TB 1ML LUER SLIP (SYRINGE) ×3 IMPLANT
WATER STERILE IRR 500ML POUR (IV SOLUTION) ×3 IMPLANT
WIPE NON LINTING 3.25X3.25 (MISCELLANEOUS) ×3 IMPLANT

## 2018-08-31 NOTE — Transfer of Care (Signed)
Immediate Anesthesia Transfer of Care Note  Patient: Diane Anderson  Procedure(s) Performed: CATARACT EXTRACTION PHACO AND INTRAOCULAR LENS PLACEMENT (IOC)  RIGHT DIABETIC (Right Eye)  Patient Location: PACU  Anesthesia Type: MAC  Level of Consciousness: awake, alert  and patient cooperative  Airway and Oxygen Therapy: Patient Spontanous Breathing and Patient connected to supplemental oxygen  Post-op Assessment: Post-op Vital signs reviewed, Patient's Cardiovascular Status Stable, Respiratory Function Stable, Patent Airway and No signs of Nausea or vomiting  Post-op Vital Signs: Reviewed and stable  Complications: No apparent anesthesia complications

## 2018-08-31 NOTE — H&P (Signed)

## 2018-08-31 NOTE — Anesthesia Postprocedure Evaluation (Signed)
Anesthesia Post Note  Patient: Diane Anderson  Procedure(s) Performed: CATARACT EXTRACTION PHACO AND INTRAOCULAR LENS PLACEMENT (IOC)  RIGHT DIABETIC (Right Eye)  Patient location during evaluation: PACU Anesthesia Type: MAC Level of consciousness: awake and alert, oriented and patient cooperative Pain management: pain level controlled Vital Signs Assessment: post-procedure vital signs reviewed and stable Respiratory status: spontaneous breathing, nonlabored ventilation and respiratory function stable Cardiovascular status: blood pressure returned to baseline and stable Postop Assessment: adequate PO intake Anesthetic complications: no    Darrin Nipper

## 2018-08-31 NOTE — Op Note (Signed)
LOCATION:  [place]   PREOPERATIVE DIAGNOSIS:    Nuclear sclerotic cataract right eye. H25.11   POSTOPERATIVE DIAGNOSIS:  Nuclear sclerotic cataract right eye.     PROCEDURE:  Phacoemusification with posterior chamber intraocular lens placement of the right eye   LENS:   Implant Name Type Inv. Item Serial No. Manufacturer Lot No. LRB No. Used  LENS IOL DIOP 15.5 - [id number] Intraocular Lens LENS IOL DIOP 15.5 [id number] AMO  Right 1        ULTRASOUND TIME: 14 % of 1 minutes, 5 seconds.  CDE 13.5   SURGEON:  Wyonia Hough, MD   ANESTHESIA:  Topical with tetracaine drops and 2% Xylocaine jelly, augmented with 1% preservative-free intracameral lidocaine.    COMPLICATIONS:  None.   DESCRIPTION OF PROCEDURE:  The patient was identified in the holding room and transported to the operating room and placed in the supine position under the operating microscope.  The right eye was identified as the operative eye and it was prepped and draped in the usual sterile ophthalmic fashion.   A 1 millimeter clear-corneal paracentesis was made at the 12:00 position.  0.5 ml of preservative-free 1% lidocaine was injected into the anterior chamber. The anterior chamber was filled with Viscoat viscoelastic.  A 2.4 millimeter keratome was used to make a near-clear corneal incision at the 9:00 position.  A curvilinear capsulorrhexis was made with a cystotome and capsulorrhexis forceps.  Balanced salt solution was used to hydrodissect and hydrodelineate the nucleus.   Phacoemulsification was then used in stop and chop fashion to remove the lens nucleus and epinucleus.  The remaining cortex was then removed using the irrigation and aspiration handpiece. Provisc was then placed into the capsular bag to distend it for lens placement.  A lens was then injected into the capsular bag.  The remaining viscoelastic was aspirated.   Wounds were hydrated with balanced salt solution.  The anterior  chamber was inflated to a physiologic pressure with balanced salt solution.  No wound leaks were noted. Vigamox 0.2 ml of a 1mg  per ml solution was injected into the anterior chamber for a dose of 0.2 mg of intracameral antibiotic at the completion of the case.    Timolol and Brimonidine drops were applied to the eye.  The patient was taken to the recovery room in stable condition without complications of anesthesia or surgery.   Saanvika Vazques 08/31/2018, 9:51 AM

## 2018-08-31 NOTE — Anesthesia Procedure Notes (Signed)
Procedure Name: MAC Date/Time: 08/31/2018 9:38 AM Performed by: Jeannene Patella, CRNA Pre-anesthesia Checklist: Patient identified, Emergency Drugs available, Suction available, Patient being monitored and Timeout performed Patient Re-evaluated:Patient Re-evaluated prior to induction Oxygen Delivery Method: Nasal cannula

## 2018-09-01 ENCOUNTER — Encounter: Payer: Self-pay | Admitting: Ophthalmology

## 2018-12-27 DIAGNOSIS — G8929 Other chronic pain: Secondary | ICD-10-CM | POA: Insufficient documentation

## 2018-12-27 DIAGNOSIS — M5440 Lumbago with sciatica, unspecified side: Secondary | ICD-10-CM | POA: Insufficient documentation

## 2019-02-10 DIAGNOSIS — M25511 Pain in right shoulder: Secondary | ICD-10-CM | POA: Insufficient documentation

## 2019-02-10 DIAGNOSIS — G8929 Other chronic pain: Secondary | ICD-10-CM | POA: Insufficient documentation

## 2019-02-10 DIAGNOSIS — M19041 Primary osteoarthritis, right hand: Secondary | ICD-10-CM | POA: Insufficient documentation

## 2019-04-04 ENCOUNTER — Other Ambulatory Visit: Payer: Self-pay | Admitting: *Deleted

## 2019-04-04 DIAGNOSIS — Z1231 Encounter for screening mammogram for malignant neoplasm of breast: Secondary | ICD-10-CM

## 2019-04-26 DIAGNOSIS — R1110 Vomiting, unspecified: Secondary | ICD-10-CM | POA: Insufficient documentation

## 2019-05-08 ENCOUNTER — Emergency Department: Payer: Medicare Other

## 2019-05-08 ENCOUNTER — Emergency Department
Admission: EM | Admit: 2019-05-08 | Discharge: 2019-05-09 | Disposition: A | Discharge status: Home / Self Care | Payer: Medicare Other | Attending: Emergency Medicine | Admitting: Emergency Medicine

## 2019-05-08 ENCOUNTER — Other Ambulatory Visit: Payer: Self-pay

## 2019-05-08 DIAGNOSIS — Z7984 Long term (current) use of oral hypoglycemic drugs: Secondary | ICD-10-CM | POA: Insufficient documentation

## 2019-05-08 DIAGNOSIS — Z91048 Other nonmedicinal substance allergy status: Secondary | ICD-10-CM | POA: Insufficient documentation

## 2019-05-08 DIAGNOSIS — J45909 Unspecified asthma, uncomplicated: Secondary | ICD-10-CM | POA: Insufficient documentation

## 2019-05-08 DIAGNOSIS — Z880 Allergy status to penicillin: Secondary | ICD-10-CM | POA: Insufficient documentation

## 2019-05-08 DIAGNOSIS — E119 Type 2 diabetes mellitus without complications: Secondary | ICD-10-CM | POA: Diagnosis not present

## 2019-05-08 DIAGNOSIS — R42 Dizziness and giddiness: Secondary | ICD-10-CM | POA: Diagnosis present

## 2019-05-08 DIAGNOSIS — E782 Mixed hyperlipidemia: Secondary | ICD-10-CM | POA: Diagnosis not present

## 2019-05-08 DIAGNOSIS — F17210 Nicotine dependence, cigarettes, uncomplicated: Secondary | ICD-10-CM | POA: Insufficient documentation

## 2019-05-08 DIAGNOSIS — Z79899 Other long term (current) drug therapy: Secondary | ICD-10-CM | POA: Insufficient documentation

## 2019-05-08 LAB — COMPREHENSIVE METABOLIC PANEL
ALT: 16 U/L (ref 0–44)
AST: 22 U/L (ref 15–41)
Albumin: 4.2 g/dL (ref 3.5–5.0)
Alkaline Phosphatase: 86 U/L (ref 38–126)
Anion gap: 10 (ref 5–15)
BUN: 16 mg/dL (ref 8–23)
CO2: 29 mmol/L (ref 22–32)
Calcium: 9.7 mg/dL (ref 8.9–10.3)
Chloride: 93 mmol/L — ABNORMAL LOW (ref 98–111)
Creatinine, Ser: 0.7 mg/dL (ref 0.44–1.00)
GFR calc Af Amer: >60 mL/min (ref >60)
GFR calc non Af Amer: >60 mL/min (ref >60)
Glucose, Bld: 123 mg/dL — ABNORMAL HIGH (ref 70–99)
Potassium: 3.7 mmol/L (ref 3.5–5.1)
Sodium: 132 mmol/L — ABNORMAL LOW (ref 135–145)
Total Bilirubin: 0.4 mg/dL (ref 0.3–1.2)
Total Protein: 7.1 g/dL (ref 6.5–8.1)

## 2019-05-08 LAB — CBC
HCT: 42.5 % (ref 36.0–46.0)
Hemoglobin: 14.7 g/dL (ref 12.0–15.0)
MCH: 29.8 pg (ref 26.0–34.0)
MCHC: 34.6 g/dL (ref 30.0–36.0)
MCV: 86.2 fL (ref 80.0–100.0)
Platelets: 281 10*3/uL (ref 150–400)
RBC: 4.93 MIL/uL (ref 3.87–5.11)
RDW: 13 % (ref 11.5–15.5)
WBC: 17.3 10*3/uL — ABNORMAL HIGH (ref 4.0–10.5)
nRBC: 0 % (ref 0.0–0.2)

## 2019-05-08 LAB — URINALYSIS, COMPLETE (UACMP) WITH MICROSCOPIC
Bacteria, UA: NONE SEEN
Bilirubin Urine: NEGATIVE
Glucose, UA: NEGATIVE mg/dL
Ketones, ur: NEGATIVE mg/dL
Leukocytes,Ua: NEGATIVE
Nitrite: NEGATIVE
Protein, ur: NEGATIVE mg/dL
Specific Gravity, Urine: 1.004 — ABNORMAL LOW (ref 1.005–1.030)
pH: 7 (ref 5.0–8.0)

## 2019-05-08 MED ORDER — SODIUM CHLORIDE 0.9 % IV BOLUS
500.0000 mL | Freq: Once | INTRAVENOUS | Status: AC
  Administered 2019-05-08: 500 mL via INTRAVENOUS

## 2019-05-08 MED ORDER — ONDANSETRON HCL 4 MG/2ML IJ SOLN
4.0000 mg | Freq: Once | INTRAMUSCULAR | Status: AC
  Administered 2019-05-08: 20:00:00 4 mg via INTRAVENOUS
  Filled 2019-05-08: qty 2

## 2019-05-08 NOTE — ED Triage Notes (Signed)
Pt arrived via ems from home for report of vertigo for the past 63yr+ - pt reports that she has had this off and on and that when she stops taking her medication it gets worse but is then relieved when she restarts

## 2019-05-08 NOTE — ED Provider Notes (Signed)
[facility] Emergency Department Provider Note   ____________________________________________    I have reviewed the triage vital signs and the nursing notes.   HISTORY  Chief Complaint Dizziness     HPI Diane Anderson is a 73 y.o. female who presents with complaints of dizziness which she describes as vertigo.  She reports that this happens to her relatively frequently and happens nearly 1 time per month.  She describes it as the room spinning.  No lightheadedness.  Typically this happens and she just has to "deal with it until it improves.  Denies neuro deficits.  No significant headache.  Does make her quite nauseated, she has not taken anything for this.  Past Medical History:  Diagnosis Date  . Allergy    Seasonal   . Anxiety   . Arthritis    left knee, left shoulder  . Asthma   . Depression   . Diabetes mellitus without complication ([place])    diet controlled after weight loss  . Discharge from left nipple 01/28/2017  . Fibrocystic breast disease   . GERD (gastroesophageal reflux disease)   . Hx of migraines   . Hypercholesteremia   . Hyperlipidemia   . Vertigo    avg 1x/mo  . Vitamin D deficiency   . Wears dentures    full upper    Patient Active Problem List   Diagnosis Date Noted  . Hypercholesteremia   . Allergy   . Hx of migraines   . Vitamin D deficiency   . Acid reflux 03/13/2016  . Pure hypercholesterolemia 03/13/2016  . Type 2 diabetes mellitus ([place]) 08/21/2015  . Recurrent major depressive disorder, in partial remission ([place]) 08/21/2015  . Clinical depression 05/30/2014  . Anxiety 01/19/2014  . Avitaminosis D 01/19/2014    Past Surgical History:  Procedure Laterality Date  . BREAST BIOPSY Right 2011   stereo bx benign  . BREAST BIOPSY Left 04/18/2016   x3. 3:00 2 cmfn benign hyalinizing stroma cyst formation, 3:00 5cmfn benign ductal ectasia, 10:00 intraductal papilloma.  Marland Kitchen BREAST EXCISIONAL BIOPSY Right  yrs ago    benign  . BREAST EXCISIONAL BIOPSY Left 05/15/2016   excision to remove papilloma at 10:00 location  . CATARACT EXTRACTION W/PHACO Left 08/10/2018   Procedure: CATARACT EXTRACTION PHACO AND INTRAOCULAR LENS PLACEMENT ([place])  LEFT DIABETIC;  Surgeon: Leandrew Koyanagi, MD;  Location: [place];  Service: Ophthalmology;  Laterality: Left;  Diabetic - diet controlled  . CATARACT EXTRACTION W/PHACO Right 08/31/2018   Procedure: CATARACT EXTRACTION PHACO AND INTRAOCULAR LENS PLACEMENT ([place])  RIGHT DIABETIC;  Surgeon: Leandrew Koyanagi, MD;  Location: [place];  Service: Ophthalmology;  Laterality: Right;  Diabetic - diet controlled  . CYST EXCISION     Dr. Pat Patrick  . EXCISION OF BREAST BIOPSY Left 05/15/2016   Procedure: EXCISION OF BREAST BIOPSY;  Surgeon: Hubbard Robinson, MD;  Location: [facility];  Service: General;  Laterality: Left;  . LIPOMA EXCISION  1995   Right Shoulder  . LYMPH GLAND EXCISION Right 2011  . TONSILLECTOMY    . TUBAL LIGATION      Prior to Admission medications   Medication Sig Start Date End Date Taking? Authorizing Provider  citalopram (CELEXA) 40 MG tablet Take 1 tablet by mouth daily. 02/04/16   [provider]  fluticasone (FLONASE) 50 MCG/ACT nasal spray Place 2 sprays into the nose daily as needed.  01/24/16   [provider]  ibuprofen (ADVIL,MOTRIN) 200 MG tablet Take  by mouth.    [provider]  lisinopril (PRINIVIL,ZESTRIL) 2.5 MG tablet Take 1 tablet by mouth daily. 04/04/16   [provider]  meclizine (ANTIVERT) 25 MG tablet Take 1 tablet by mouth 1 day or 1 dose. 11/14/16   [provider]  montelukast (SINGULAIR) 10 MG tablet Take 1 tablet by mouth at bedtime. 04/06/17 08/25/18  [provider]  omeprazole (PRILOSEC) 20 MG capsule Take 1 capsule by mouth daily as needed.  02/04/16   [provider]  rosuvastatin (CRESTOR) 20 MG tablet Take 20 mg by mouth daily.  03/04/18   [provider]  Vitamin D, Ergocalciferol, (DRISDOL) 50000 units CAPS capsule Take 50,000 Units by mouth every 7 (seven) days.    [provider]     Allergies Penicillins, Adhesive [tape], and Atorvastatin  Family History  Problem Relation Age of Onset  . Dementia Mother   . Cancer Mother        skin  . Osteoporosis Mother   . Alzheimer's disease Father   . Asthma Father   . Hypertension Father   . Breast cancer Maternal Aunt 51  . Cancer Maternal Aunt        Breast Cancer  . Hypertension Maternal Aunt   . Birth defects Cousin 37       Breast  . Breast cancer Cousin   . Breast cancer Cousin   . Hypertension Sister   . Diabetes Paternal Grandmother     Social History Social History   Tobacco Use  . Smoking status: Current Every Day Smoker    Packs/day: 1.00    Years: 52.00    Pack years: 52.00    Types: Cigarettes  . Smokeless tobacco: Never Used  Substance Use Topics  . Alcohol use: No    Alcohol/week: 0.0 standard drinks    Comment: rarely  . Drug use: No    Review of Systems  Constitutional: No fever/chills Eyes: No visual changes.  ENT: No sore throat. Cardiovascular: Denies chest pain. Respiratory: Denies shortness of breath. Gastrointestinal: No abdominal pain Genitourinary: Negative for dysuria. Musculoskeletal: Negative for back pain. Skin: Negative for rash. Neurological: As above   ____________________________________________   PHYSICAL EXAM:  VITAL SIGNS: ED Triage Vitals  Enc Vitals Group     BP 05/08/19 2217 (!) 156/91     Pulse Rate 05/08/19 1902 74     Resp 05/08/19 1902 16     Temp 05/08/19 1902 97.6 F (36.4 C)     Temp Source 05/08/19 1902 Oral     SpO2 05/08/19 1902 93 %     Weight 05/08/19 1903 72.6 kg (160 lb)     Height 05/08/19 1903 1.6 m (5\' 3" )     Head Circumference --      Peak Flow --      Pain Score 05/08/19 1902 5     Pain Loc --      Pain Edu? --      Excl. in [place]? --      Constitutional: Alert and oriented.  Eyes: PERRLA, rightward nystagmus Nose: No congestion/rhinnorhea. Mouth/Throat: Mucous membranes are moist.   Neck:  Painless ROM Cardiovascular: Normal rate, regular rhythm. Grossly normal heart sounds.  Good peripheral circulation. Respiratory: Normal respiratory effort.  No retractions. Lungs CTAB. Gastrointestinal: Soft and nontender. No distention.    Musculoskeletal:   Warm and well perfused Neurologic:  Normal speech and language. No gross focal neurologic deficits are appreciated.  Skin:  Skin is warm, dry  and intact. No rash noted. Psychiatric: Mood and affect are normal. Speech and behavior are normal.  ____________________________________________   LABS (all labs ordered are listed, but only abnormal results are displayed)  Labs Reviewed  CBC - Abnormal; Notable for the following components:      Result Value   WBC 17.3 (*)    All other components within normal limits  COMPREHENSIVE METABOLIC PANEL - Abnormal; Notable for the following components:   Sodium 132 (*)    Chloride 93 (*)    Glucose, Bld 123 (*)    All other components within normal limits  URINALYSIS, COMPLETE (UACMP) WITH MICROSCOPIC   ____________________________________________  EKG  ED ECG REPORT I, Lavonia Drafts, the attending physician, personally viewed and interpreted this ECG.  Date: 05/08/2019  Rhythm: normal sinus rhythm QRS Axis: normal Intervals: normal ST/T Wave abnormalities: normal Narrative Interpretation: no evidence of acute ischemia  ____________________________________________  RADIOLOGY  Chest x-ray mild interstitial prominence ____________________________________________   PROCEDURES  Procedure(s) performed: No  Procedures   Critical Care performed: No ____________________________________________   INITIAL IMPRESSION / ASSESSMENT AND PLAN / ED COURSE  Pertinent labs & imaging results that were available during my care  of the patient were reviewed by me and considered in my medical decision making (see chart for details).  Patient overall well-appearing and in no acute distress.  She reports her symptoms have improved since she has been here.  She denies neuro deficits, no headache.  No recent imaging for her symptoms.  Pending labs, urinalysis  Elevated white blood cell count but patient has no fever or infectious symptoms.  Pending urinalysis, chest x-ray is unremarkable.    ____________________________________________   FINAL CLINICAL IMPRESSION(S) / ED DIAGNOSES  Final diagnoses:  Vertigo        Note:  This document was prepared using Dragon voice recognition software and may include unintentional dictation errors.   Lavonia Drafts, MD 05/08/19 [phone]

## 2019-05-09 DIAGNOSIS — R42 Dizziness and giddiness: Secondary | ICD-10-CM | POA: Diagnosis not present

## 2019-05-09 MED ORDER — ONDANSETRON 4 MG PO TBDP: 4.0000 mg | ORAL_TABLET | Freq: Four times a day (QID) | ORAL | 0 refills | Status: DC | PRN

## 2019-05-09 MED ORDER — ONDANSETRON 4 MG PO TBDP
4.0000 mg | ORAL_TABLET | Freq: Once | ORAL | Status: AC
  Administered 2019-05-09: 02:00:00 4 mg via ORAL
  Filled 2019-05-09: qty 1

## 2019-05-09 NOTE — ED Provider Notes (Signed)
Ct Head Wo Contrast  Result Date: 05/09/2019 CLINICAL DATA:  73 year old female with vertigo. EXAM: CT HEAD WITHOUT CONTRAST TECHNIQUE: Contiguous axial images were obtained from the base of the skull through the vertex without intravenous contrast. COMPARISON:  None. FINDINGS: Brain: There is moderate age-related atrophy and chronic microvascular ischemic changes. There is no acute intracranial hemorrhage. No mass effect or midline shift. No extra-axial fluid collection. Vascular: No hyperdense vessel or unexpected calcification. Skull: Normal. Negative for fracture or focal lesion. Sinuses/Orbits: There is complete opacification the right frontal sinus as well as several right ethmoid air cells. No air-fluid level. The mastoid air cells are clear. Other: None IMPRESSION: 1. No acute intracranial hemorrhage. 2. Moderate age-related atrophy and chronic microvascular ischemic changes. Electronically Signed   By: Anner Crete M.D.   On: 05/09/2019 00:04   Dg Chest Port 1 View  Result Date: 05/08/2019 CLINICAL DATA:  Weakness EXAM: PORTABLE CHEST 1 VIEW COMPARISON:  07/12/2012 FINDINGS: Heart is borderline in size. Interstitial prominence within the lungs. No effusions. No acute bony abnormality. IMPRESSION: Borderline heart size. Interstitial prominence within the lungs could reflect early interstitial edema. Electronically Signed   By: Rolm Baptise M.D.   On: 05/08/2019 21:29    CT head negative for acute.  Patient resting comfortably.  Reports she would like something to drink, has mild nausea from time to time would like of her nausea prescription addition to her meclizine.  She reports Zofran did work well, will give her prescription for this.  Patient comfortable with plan for discharge and will follow up with her primary doctor Dr. Clide Cliff  She is alert, well oriented no acute distress.  Family coming to pick her up  Ct Head Wo Contrast  Result Date: 05/09/2019 CLINICAL DATA:  74 year old  female with vertigo. EXAM: CT HEAD WITHOUT CONTRAST TECHNIQUE: Contiguous axial images were obtained from the base of the skull through the vertex without intravenous contrast. COMPARISON:  None. FINDINGS: Brain: There is moderate age-related atrophy and chronic microvascular ischemic changes. There is no acute intracranial hemorrhage. No mass effect or midline shift. No extra-axial fluid collection. Vascular: No hyperdense vessel or unexpected calcification. Skull: Normal. Negative for fracture or focal lesion. Sinuses/Orbits: There is complete opacification the right frontal sinus as well as several right ethmoid air cells. No air-fluid level. The mastoid air cells are clear. Other: None IMPRESSION: 1. No acute intracranial hemorrhage. 2. Moderate age-related atrophy and chronic microvascular ischemic changes. Electronically Signed   By: Anner Crete M.D.   On: 05/09/2019 00:04   Dg Chest Port 1 View  Result Date: 05/08/2019 CLINICAL DATA:  Weakness EXAM: PORTABLE CHEST 1 VIEW COMPARISON:  07/12/2012 FINDINGS: Heart is borderline in size. Interstitial prominence within the lungs. No effusions. No acute bony abnormality. IMPRESSION: Borderline heart size. Interstitial prominence within the lungs could reflect early interstitial edema. Electronically Signed   By: Rolm Baptise M.D.   On: 05/08/2019 21:29    Urinalysis negative for infection.  Patient denies infectious symptoms.  Afebrile.   Delman Kitten, MD 05/09/19 [phone]

## 2019-05-09 NOTE — ED Notes (Signed)
Son called to pick up pt

## 2019-05-31 ENCOUNTER — Ambulatory Visit: Payer: Medicare Other | Admitting: General Surgery

## 2019-06-10 ENCOUNTER — Other Ambulatory Visit (HOSPITAL_COMMUNITY): Payer: Self-pay | Admitting: Otolaryngology

## 2019-06-10 ENCOUNTER — Other Ambulatory Visit: Payer: Self-pay | Admitting: Otolaryngology

## 2019-06-10 DIAGNOSIS — R42 Dizziness and giddiness: Secondary | ICD-10-CM

## 2019-06-15 ENCOUNTER — Ambulatory Visit
Admission: RE | Admit: 2019-06-15 | Discharge: 2019-06-15 | Disposition: A | Discharge status: Home / Self Care | Payer: Medicare Other | Source: Ambulatory Visit | Attending: Surgery | Admitting: Surgery

## 2019-06-15 DIAGNOSIS — Z1231 Encounter for screening mammogram for malignant neoplasm of breast: Secondary | ICD-10-CM

## 2019-06-23 ENCOUNTER — Other Ambulatory Visit: Payer: Self-pay

## 2019-06-23 ENCOUNTER — Ambulatory Visit
Admission: RE | Admit: 2019-06-23 | Discharge: 2019-06-23 | Disposition: A | Discharge status: Home / Self Care | Payer: Medicare Other | Source: Ambulatory Visit | Attending: Otolaryngology | Admitting: Otolaryngology

## 2019-06-23 DIAGNOSIS — R42 Dizziness and giddiness: Secondary | ICD-10-CM | POA: Insufficient documentation

## 2019-06-23 LAB — POCT I-STAT CREATININE: Creatinine, Ser: 0.8 mg/dL (ref 0.44–1.00)

## 2019-06-23 MED ORDER — GADOBUTROL 1 MMOL/ML IV SOLN
7.0000 mL | Freq: Once | INTRAVENOUS | Status: AC | PRN
  Administered 2019-06-23: 16:00:00 7 mL via INTRAVENOUS

## 2019-06-30 ENCOUNTER — Encounter: Payer: Self-pay | Admitting: General Surgery

## 2019-06-30 ENCOUNTER — Other Ambulatory Visit: Payer: Self-pay

## 2019-06-30 ENCOUNTER — Ambulatory Visit (INDEPENDENT_AMBULATORY_CARE_PROVIDER_SITE_OTHER): Payer: Medicare Other | Admitting: General Surgery

## 2019-06-30 VITALS — BP 145/75 | HR 67 | Temp 95.2°F | Ht 63.0 in | Wt 162.4 lb

## 2019-06-30 DIAGNOSIS — Z9289 Personal history of other medical treatment: Secondary | ICD-10-CM

## 2019-06-30 NOTE — Progress Notes (Signed)
Patient ID: Diane Diane, female   DOB: March 25, 1946, 73 y.o.   MRN: [id number]  Chief Complaint  Patient presents with  . Follow-up    1 year screening mammogram bil    HPI Diane Diane is a 73 y.o. female.   She has been followed in this practice for a number of years.  She underwent excision of an intraductal papilloma in 2017.  She has been seen on a regular basis and was last seen 1 year ago by a physician who is no longer with this practice.  She had a recent screening mammogram which was negative for any concerning findings.  She is here today for follow-up.  She reports performing breast self exams.  She has not noticed any skin changes.  There has been no drainage from her nipples.  She has not noticed any masses or lumps in her breasts or in her armpits.  She says there is an area adjacent to the nipple, where she had her surgery, that feels like scar tissue.  No breast pain.   Past Medical History:  Diagnosis Date  . Allergy    Seasonal   . Anxiety   . Arthritis    left knee, left shoulder  . Asthma   . Depression   . Diabetes mellitus without complication ([place])    diet controlled after weight loss  . Discharge from left nipple 01/28/2017  . Fibrocystic breast disease   . GERD (gastroesophageal reflux disease)   . Hx of migraines   . Hypercholesteremia   . Hyperlipidemia   . Vertigo    avg 1x/mo  . Vitamin D deficiency   . Wears dentures    full upper    Past Surgical History:  Procedure Laterality Date  . BREAST BIOPSY Right 2011   stereo bx benign  . BREAST BIOPSY Left 04/18/2016   x3. 3:00 2 cmfn benign hyalinizing stroma cyst formation, 3:00 5cmfn benign ductal ectasia, 10:00 intraductal papilloma.  Marland Kitchen BREAST EXCISIONAL BIOPSY Right yrs ago    benign  . BREAST EXCISIONAL BIOPSY Left 05/15/2016   excision to remove papilloma at 10:00 location  . CATARACT EXTRACTION W/PHACO Left 08/10/2018   Procedure: CATARACT EXTRACTION PHACO AND INTRAOCULAR  LENS PLACEMENT ([place])  LEFT DIABETIC;  Surgeon: Leandrew Koyanagi, MD;  Location: [place];  Service: Ophthalmology;  Laterality: Left;  Diabetic - diet controlled  . CATARACT EXTRACTION W/PHACO Right 08/31/2018   Procedure: CATARACT EXTRACTION PHACO AND INTRAOCULAR LENS PLACEMENT ([place])  RIGHT DIABETIC;  Surgeon: Leandrew Koyanagi, MD;  Location: [place];  Service: Ophthalmology;  Laterality: Right;  Diabetic - diet controlled  . CYST EXCISION     Dr. Pat Patrick  . EXCISION OF BREAST BIOPSY Left 05/15/2016   Procedure: EXCISION OF BREAST BIOPSY;  Surgeon: Hubbard Robinson, MD;  Location: [facility];  Service: General;  Laterality: Left;  . LIPOMA EXCISION  1995   Right Shoulder  . LYMPH GLAND EXCISION Right 2011  . TONSILLECTOMY    . TUBAL LIGATION      Family History  Problem Relation Age of Onset  . Dementia Mother   . Cancer Mother        skin  . Osteoporosis Mother   . Alzheimer's disease Father   . Asthma Father   . Hypertension Father   . Breast cancer Maternal Aunt 35  . Cancer Maternal Aunt        Breast Cancer  . Hypertension Maternal Aunt   . Birth defects  Cousin 1       Breast  . Breast cancer Cousin   . Breast cancer Cousin   . Hypertension Sister   . Diabetes Paternal Grandmother   . Breast cancer Maternal Grandfather     Social History Social History   Tobacco Use  . Smoking status: Current Every Day Smoker    Packs/day: 1.00    Years: 52.00    Pack years: 52.00    Types: Cigarettes  . Smokeless tobacco: Never Used  Substance Use Topics  . Alcohol use: No    Alcohol/week: 0.0 standard drinks    Comment: rarely  . Drug use: No    Allergies  Allergen Reactions  . Cyclobenzaprine Other (See Comments) and Rash    It made her vertigo worse.  Marland Kitchen Penicillins Rash  . Acetaminophen Other (See Comments)    Can't take this because of acetaminophen  . Adhesive [Tape] Itching    "heavy" white tape  . Atorvastatin Other (See Comments)     Severe Sweating    Current Outpatient Medications  Medication Sig Dispense Refill  . citalopram (CELEXA) 40 MG tablet Take 1 tablet by mouth daily.    . fluticasone (FLONASE) 50 MCG/ACT nasal spray Place 2 sprays into the nose daily as needed.     Marland Kitchen lisinopril (PRINIVIL,ZESTRIL) 2.5 MG tablet Take 1 tablet by mouth daily.    Marland Kitchen omeprazole (PRILOSEC) 20 MG capsule Take 1 capsule by mouth daily as needed.     . ondansetron (ZOFRAN ODT) 4 MG disintegrating tablet Take 1 tablet (4 mg total) by mouth every 6 (six) hours as needed for nausea or vomiting. 20 tablet 0  . rosuvastatin (CRESTOR) 20 MG tablet Take 20 mg by mouth daily.  3  . Vitamin D, Ergocalciferol, (DRISDOL) 50000 units CAPS capsule Take 50,000 Units by mouth every 7 (seven) days.    . montelukast (SINGULAIR) 10 MG tablet Take 1 tablet by mouth at bedtime.     No current facility-administered medications for this visit.     Review of Systems Review of Systems  All other systems reviewed and are negative.   Blood pressure (!) 145/75, pulse 67, temperature (!) 95.2 F (35.1 C), temperature source Temporal, height 5\' 3"  (1.6 m), weight 162 lb 6.4 oz (73.7 kg), SpO2 92 %. Body mass index is 28.77 kg/m.   Physical Exam Physical Exam Vitals signs reviewed. Exam conducted with a chaperone present.  Constitutional:      General: She is not in acute distress.    Appearance: Normal appearance.  HENT:     Head: Normocephalic and atraumatic.     Nose:     Comments: Covered with a mask secondary to COVID-19 precautions    Mouth/Throat:     Comments: Covered with a mask secondary to COVID-19 precautions Eyes:     General: No scleral icterus.       Right eye: No discharge.        Left eye: No discharge.  Neck:     Musculoskeletal: Normal range of motion.  Cardiovascular:     Rate and Rhythm: Normal rate and regular rhythm.     Pulses: Normal pulses.  Pulmonary:     Effort: Pulmonary effort is normal.     Breath sounds:  Normal breath sounds.  Chest:     Breasts:        Right: Normal.        Left: Normal.       Comments: Dense fibroglandular tissue.  Scar adjacent to left nipple.  No masses appreciated in either breast. Abdominal:     General: Abdomen is flat. Bowel sounds are normal.     Palpations: Abdomen is soft.  Genitourinary:    Comments: Deferred Musculoskeletal:        General: No swelling.  Lymphadenopathy:     Cervical: No cervical adenopathy.     Upper Body:     Right upper body: No supraclavicular, axillary or pectoral adenopathy.     Left upper body: No supraclavicular, axillary or pectoral adenopathy.  Skin:    General: Skin is warm and dry.     Comments: Multiple seborrheic keratoses  Neurological:     General: No focal deficit present.     Mental Status: She is alert and oriented to person, place, and time.     Data Reviewed I reviewed the patient's most recent mammogram, performed on 06/15/2019.  I agree with the results posted here:: CLINICAL DATA:  Screening.  EXAM: DIGITAL SCREENING BILATERAL MAMMOGRAM WITH TOMO AND CAD  COMPARISON:  Previous exam(s).  ACR Breast Density Category c: The breast tissue is heterogeneously dense, which may obscure small masses.  FINDINGS: There are no findings suspicious for malignancy. Images were processed with CAD.  IMPRESSION: No mammographic evidence of malignancy. A result letter of this screening mammogram will be mailed directly to the patient.  RECOMMENDATION: Screening mammogram in one year. (Code:SM-B-01Y)  BI-RADS CATEGORY  1: Negative.  Assessment This is a 73 year old woman who underwent excision of a benign ductal papilloma in 2017.  Subsequent mammograms have been negative.  There is no need for ongoing surgical follow-up.  Plan Ms. Diane Diane should continue to perform monthly breast self-examinations.  She should follow up with her primary care provider on a regular basis and have annual screening  mammography.  Should any concerning findings be identified that would require surgical intervention, she is more than welcome to return to our practice at any time.    Fredirick Maudlin 06/30/2019, 11:58 AM

## 2019-06-30 NOTE — Patient Instructions (Signed)
Follow-up with our office as needed. Please call and ask to speak with a nurse if you develop questions or concerns.  Breast Self-Awareness Breast self-awareness means being familiar with how your breasts look and feel. It involves checking your breasts regularly and reporting any changes to your health care provider. Practicing breast self-awareness is important. Sometimes changes may not be harmful (are benign), but sometimes a change in your breasts can be a sign of a serious medical problem. It is important to learn how to do this procedure correctly so that you can catch problems early, when treatment is more likely to be successful. All women should practice breast self-awareness, including women who have had breast implants. What you need:  A mirror.  A well-lit room. How to do a breast self-exam A breast self-exam is one way to learn what is normal for your breasts and whether your breasts are changing. To do a breast self-exam: Look for changes  1. Remove all the clothing above your waist. 2. Stand in front of a mirror in a room with good lighting. 3. Put your hands on your hips. 4. Push your hands firmly downward. 5. Compare your breasts in the mirror. Look for differences between them (asymmetry), such as: ? Differences in shape. ? Differences in size. ? Puckers, dips, and bumps in one breast and not the other. 6. Look at each breast for changes in the skin, such as: ? Redness. ? Scaly areas. 7. Look for changes in your nipples, such as: ? Discharge. ? Bleeding. ? Dimpling. ? Redness. ? A change in position. Feel for changes Carefully feel your breasts for lumps and changes. It is best to do this while lying on your back on the floor, and again while sitting or standing in the tub or shower with soapy water on your skin. Feel each breast in the following way: 1. Place the arm on the side of the breast you are examining above your head. 2. Feel your breast with the  other hand. 3. Start in the nipple area and make -inch (2 cm) overlapping circles to feel your breast. Use the pads of your three middle fingers to do this. Apply light pressure, then medium pressure, then firm pressure. The light pressure will allow you to feel the tissue closest to the skin. The medium pressure will allow you to feel the tissue that is a little deeper. The firm pressure will allow you to feel the tissue close to the ribs. 4. Continue the overlapping circles, moving downward over the breast until you feel your ribs below your breast. 5. Move one finger-width toward the center of the body. Continue to use the -inch (2 cm) overlapping circles to feel your breast as you move slowly up toward your collarbone. 6. Continue the up-and-down exam using all three pressures until you reach your armpit.  Write down what you find Writing down what you find can help you remember what to discuss with your health care provider. Write down:  What is normal for each breast.  Any changes that you find in each breast, including: ? The kind of changes you find. ? Any pain or tenderness. ? Size and location of any lumps.  Where you are in your menstrual cycle, if you are still menstruating. General tips and recommendations  Examine your breasts every month.  If you are breastfeeding, the best time to examine your breasts is after a feeding or after using a breast pump.  If you menstruate, the   best time to examine your breasts is 5-7 days after your period. Breasts are generally lumpier during menstrual periods, and it may be more difficult to notice changes.  With time and practice, you will become more familiar with the variations in your breasts and more comfortable with the exam. Contact a health care provider if you:  See a change in the shape or size of your breasts or nipples.  See a change in the skin of your breast or nipples, such as a reddened or scaly area.  Have unusual  discharge from your nipples.  Find a lump or thick area that was not there before.  Have pain in your breasts.  Have any concerns related to your breast health. Summary  Breast self-awareness includes looking for physical changes in your breasts, as well as feeling for any changes within your breasts.  Breast self-awareness should be performed in front of a mirror in a well-lit room.  You should examine your breasts every month. If you menstruate, the best time to examine your breasts is 5-7 days after your menstrual period.  Let your health care provider know of any changes you notice in your breasts, including changes in size, changes on the skin, pain or tenderness, or unusual fluid from your nipples. This information is not intended to replace advice given to you by your health care provider. Make sure you discuss any questions you have with your health care provider. Document Released: 09/01/2005 Document Revised: 04/20/2018 Document Reviewed: 04/20/2018 Elsevier Patient Education  2020 [employer]  

## 2019-07-01 ENCOUNTER — Other Ambulatory Visit (HOSPITAL_COMMUNITY): Payer: Self-pay | Admitting: Otolaryngology

## 2019-07-01 ENCOUNTER — Other Ambulatory Visit: Payer: Self-pay | Admitting: Otolaryngology

## 2019-07-01 DIAGNOSIS — I6521 Occlusion and stenosis of right carotid artery: Secondary | ICD-10-CM

## 2019-07-05 ENCOUNTER — Other Ambulatory Visit
Admission: RE | Admit: 2019-07-05 | Discharge: 2019-07-05 | Disposition: A | Discharge status: Home / Self Care | Payer: Medicare Other | Source: Ambulatory Visit | Attending: Otolaryngology | Admitting: Otolaryngology

## 2019-07-05 ENCOUNTER — Other Ambulatory Visit: Payer: Self-pay

## 2019-07-05 ENCOUNTER — Ambulatory Visit
Admission: RE | Admit: 2019-07-05 | Discharge: 2019-07-05 | Disposition: A | Discharge status: Home / Self Care | Payer: Medicare Other | Source: Ambulatory Visit | Attending: Otolaryngology | Admitting: Otolaryngology

## 2019-07-05 DIAGNOSIS — I6521 Occlusion and stenosis of right carotid artery: Secondary | ICD-10-CM | POA: Insufficient documentation

## 2019-07-05 LAB — CREATININE, SERUM
Creatinine, Ser: 0.7 mg/dL (ref 0.44–1.00)
GFR calc Af Amer: >60 mL/min (ref >60)
GFR calc non Af Amer: >60 mL/min (ref >60)

## 2019-07-05 MED ORDER — IOHEXOL 350 MG/ML SOLN
100.0000 mL | Freq: Once | INTRAVENOUS | Status: AC | PRN
  Administered 2019-07-05: 80 mL via INTRAVENOUS

## 2019-07-27 ENCOUNTER — Encounter: Payer: Self-pay | Admitting: *Deleted

## 2019-07-27 ENCOUNTER — Other Ambulatory Visit: Payer: Self-pay

## 2019-08-01 ENCOUNTER — Other Ambulatory Visit
Admission: RE | Admit: 2019-08-01 | Discharge: 2019-08-01 | Disposition: A | Discharge status: Home / Self Care | Payer: Medicare Other | Source: Ambulatory Visit | Attending: Otolaryngology | Admitting: Otolaryngology

## 2019-08-01 ENCOUNTER — Other Ambulatory Visit: Payer: Self-pay

## 2019-08-01 DIAGNOSIS — Z01812 Encounter for preprocedural laboratory examination: Secondary | ICD-10-CM | POA: Insufficient documentation

## 2019-08-01 DIAGNOSIS — Z20828 Contact with and (suspected) exposure to other viral communicable diseases: Secondary | ICD-10-CM | POA: Insufficient documentation

## 2019-08-01 LAB — SARS CORONAVIRUS 2 (TAT 6-24 HRS): SARS Coronavirus 2: NEGATIVE

## 2019-08-01 NOTE — Discharge Instructions (Signed)
[facility] °[facility] °ENDOSCOPIC SINUS SURGERY °[facility] ° °What is Functional Endoscopic Sinus Surgery? ° The Surgery involves making the natural openings of the sinuses larger by removing the bony partitions that separate the sinuses from the nasal cavity.  The natural sinus lining is preserved as much as possible to allow the sinuses to resume normal function after the surgery.  In some patients nasal polyps (excessively swollen lining of the sinuses) may be removed to relieve obstruction of the sinus openings.  The surgery is performed through the nose using lighted scopes, which eliminates the need for incisions on the face.  A septoplasty is a different procedure which is sometimes performed with sinus surgery.  It involves straightening the boy partition that separates the two sides of your nose.  A crooked or deviated septum may need repair if is obstructing the sinuses or nasal airflow.  Turbinate reduction is also often performed during sinus surgery.  The turbinates are bony proturberances from the side walls of the nose which swell and can obstruct the nose in patients with sinus and allergy problems.  Their size can be surgically reduced to help relieve nasal obstruction. ° °What Can Sinus Surgery Do For Me? ° Sinus surgery can reduce the frequency of sinus infections requiring antibiotic treatment.  This can provide improvement in nasal congestion, post-nasal drainage, facial pressure and nasal obstruction.  Surgery will NOT prevent you from ever having an infection again, so it usually only for patients who get infections 4 or more times yearly requiring antibiotics, or for infections that do not clear with antibiotics.  It will not cure nasal allergies, so patients with allergies may still require medication to treat their allergies after surgery. Surgery may improve headaches related to sinusitis, however, some people will continue to  require medication to control sinus headaches related to allergies.  Surgery will do nothing for other forms of headache (migraine, tension or cluster). ° °What Are the Risks of Endoscopic Sinus Surgery? ° Current techniques allow surgery to be performed safely with little risk, however, there are rare complications that patients should be aware of.  Because the sinuses are located around the eyes, there is risk of eye injury, including blindness, though again, this would be quite rare. This is usually a result of bleeding behind the eye during surgery, which puts the vision oat risk, though there are treatments to protect the vision and prevent permanent disrupted by surgery causing a leak of the spinal fluid that surrounds the brain.  More serious complications would include bleeding inside the brain cavity or damage to the brain.  Again, all of these complications are uncommon, and spinal fluid leaks can be safely managed surgically if they occur.  The most common complication of sinus surgery is bleeding from the nose, which may require packing or cauterization of the nose.  Continued sinus have polyps may experience recurrence of the polyps requiring revision surgery.  Alterations of sense of smell or injury to the tear ducts are also rare complications.  ° °What is the Surgery Like, and what is the Recovery? ° The Surgery usually takes a couple of hours to perform, and is usually performed under a general anesthetic (completely asleep).  Patients are usually discharged home after a couple of hours.  Sometimes during surgery it is necessary to pack the nose to control bleeding, and the packing is left in place for 24 - 48 hours, and removed by your surgeon.    If a septoplasty was performed during the procedure, there is often a splint placed which must be removed after 5-7 days.   °Discomfort: Pain is usually mild to moderate, and can be controlled by prescription pain medication or acetaminophen (Tylenol).   Aspirin, Ibuprofen (Advil, Motrin), or Naprosyn (Aleve) should be avoided, as they can cause increased bleeding.  Most patients feel sinus pressure like they have a bad head cold for several days.  Sleeping with your head elevated can help reduce swelling and facial pressure, as can ice packs over the face.  A humidifier may be helpful to keep the mucous and blood from drying in the nose.  ° °Diet: There are no specific diet restrictions, however, you should generally start with clear liquids and a light diet of bland foods because the anesthetic can cause some nausea.  Advance your diet depending on how your stomach feels.  Taking your pain medication with food will often help reduce stomach upset which pain medications can cause. ° °Nasal Saline Irrigation: It is important to remove blood clots and dried mucous from the nose as it is healing.  This is done by having you irrigate the nose at least 3 - 4 times daily with a salt water solution.  We recommend using NeilMed Sinus Rinse (available at the drug store).  Fill the squeeze bottle with the solution, bend over a sink, and insert the tip of the squeeze bottle into the nose ½ of an inch.  Point the tip of the squeeze bottle towards the inside corner of the eye on the same side your irrigating.  Squeeze the bottle and gently irrigate the nose.  If you bend forward as you do this, most of the fluid will flow back out of the nose, instead of down your throat.   The solution should be warm, near body temperature, when you irrigate.   Each time you irrigate, you should use a full squeeze bottle.  ° °Note that if you are instructed to use Nasal Steroid Sprays at any time after your surgery, irrigate with saline BEFORE using the steroid spray, so you do not wash it all out of the nose. °Another product, Nasal Saline Gel (such as AYR Nasal Saline Gel) can be applied in each nostril 3 - 4 times daily to moisture the nose and reduce scabbing or crusting. ° °Bleeding:   Bloody drainage from the nose can be expected for several days, and patients are instructed to irrigate their nose frequently with salt water to help remove mucous and blood clots.  The drainage may be dark red or brown, though some fresh blood may be seen intermittently, especially after irrigation.  Do not blow you nose, as bleeding may occur. If you must sneeze, keep your mouth open to allow air to escape through your mouth. ° °If heavy bleeding occurs: Irrigate the nose with saline to rinse out clots, then spray the nose 3 - 4 times with Afrin Nasal Decongestant Spray.  The spray will constrict the blood vessels to slow bleeding.  Pinch the lower half of your nose shut to apply pressure, and lay down with your head elevated.  Ice packs over the nose may help as well. If bleeding persists despite these measures, you should notify your doctor.  Do not use the Afrin routinely to control nasal congestion after surgery, as it can result in worsening congestion and may affect healing.  ° ° ° °Activity: Return to work varies among patients. Most patients will be   out of work at least 5 - 7 days to recover.  Patient may return to work after they are off of narcotic pain medication, and feeling well enough to perform the functions of their job.  Patients must avoid heavy lifting (over 10 pounds) or strenuous physical for 2 weeks after surgery, so your employer may need to assign you to light duty, or keep you out of work longer if light duty is not possible.  NOTE: you should not drive, operate dangerous machinery, do any mentally demanding tasks or make any important legal or financial decisions while on narcotic pain medication and recovering from the general anesthetic.  °  °Call Your Doctor Immediately if You Have Any of the Following: °1. Bleeding that you cannot control with the above measures °2. Loss of vision, double vision, bulging of the eye or black eyes. °3. Fever over 101 degrees °4. Neck stiffness with  severe headache, fever, nausea and change in mental state. °You are always encourage to call anytime with concerns, however, please call with requests for pain medication refills during office hours. ° °Office Endoscopy: During follow-up visits your doctor will remove any packing or splints that may have been placed and evaluate and clean your sinuses endoscopically.  Topical anesthetic will be used to make this as comfortable as possible, though you may want to take your pain medication prior to the visit.  How often this will need to be done varies from patient to patient.  After complete recovery from the surgery, you may need follow-up endoscopy from time to time, particularly if there is concern of recurrent infection or nasal polyps. ° ° °General Anesthesia, Adult, Care After °This sheet gives you information about how to care for yourself after your procedure. Your health care provider may also give you more specific instructions. If you have problems or questions, contact your health care provider. °What can I expect after the procedure? °After the procedure, the following side effects are common: °· Pain or discomfort at the IV site. °· Nausea. °· Vomiting. °· Sore throat. °· Trouble concentrating. °· Feeling cold or chills. °· Weak or tired. °· Sleepiness and fatigue. °· Soreness and body aches. These side effects can affect parts of the body that were not involved in surgery. °Follow these instructions at home: ° °For at least 24 hours after the procedure: °· Have a responsible adult stay with you. It is important to have someone help care for you until you are awake and alert. °· Rest as needed. °· Do not: °? Participate in activities in which you could fall or become injured. °? Drive. °? Use heavy machinery. °? Drink alcohol. °? Take sleeping pills or medicines that cause drowsiness. °? Make important decisions or sign legal documents. °? Take care of children on your own. °Eating and  drinking °· Follow any instructions from your health care provider about eating or drinking restrictions. °· When you feel hungry, start by eating small amounts of foods that are soft and easy to digest (bland), such as toast. Gradually return to your regular diet. °· Drink enough fluid to keep your urine pale yellow. °· If you vomit, rehydrate by drinking water, juice, or clear broth. °General instructions °· If you have sleep apnea, surgery and certain medicines can increase your risk for breathing problems. Follow instructions from your health care provider about wearing your sleep device: °? Anytime you are sleeping, including during daytime naps. °? While taking prescription pain medicines, sleeping medicines, or medicines   that make you drowsy. °· Return to your normal activities as told by your health care provider. Ask your health care provider what activities are safe for you. °· Take over-the-counter and prescription medicines only as told by your health care provider. °· If you smoke, do not smoke without supervision. °· Keep all follow-up visits as told by your health care provider. This is important. °Contact a health care provider if: °· You have nausea or vomiting that does not get better with medicine. °· You cannot eat or drink without vomiting. °· You have pain that does not get better with medicine. °· You are unable to pass urine. °· You develop a skin rash. °· You have a fever. °· You have redness around your IV site that gets worse. °Get help right away if: °· You have difficulty breathing. °· You have chest pain. °· You have blood in your urine or stool, or you vomit blood. °Summary °· After the procedure, it is common to have a sore throat or nausea. It is also common to feel tired. °· Have a responsible adult stay with you for the first 24 hours after general anesthesia. It is important to have someone help care for you until you are awake and alert. °· When you feel hungry, start by eating  small amounts of foods that are soft and easy to digest (bland), such as toast. Gradually return to your regular diet. °· Drink enough fluid to keep your urine pale yellow. °· Return to your normal activities as told by your health care provider. Ask your health care provider what activities are safe for you. °This information is not intended to replace advice given to you by your health care provider. Make sure you discuss any questions you have with your health care provider. °Document Released: 12/08/2000 Document Revised: 09/04/2017 Document Reviewed: 04/17/2017 °Elsevier Patient Education © 2020 [employer] Inc. ° °

## 2019-08-01 NOTE — Anesthesia Preprocedure Evaluation (Addendum)
Anesthesia Evaluation  Patient identified by MRN, date of birth, ID band Patient awake    Reviewed: Allergy & Precautions, NPO status , Patient's Chart, lab work & pertinent test results  History of Anesthesia Complications Negative for: history of anesthetic complications  Airway Mallampati: II  TM Distance: >3 FB Neck ROM: Limited    Dental  (+) Upper Dentures   Pulmonary asthma , Current Smoker,    breath sounds clear to auscultation       Cardiovascular (-) angina(-) DOE  Rhythm:Regular Rate:Normal   HLD   Neuro/Psych  Headaches, PSYCHIATRIC DISORDERS Anxiety Depression  Chronic back pain  Neuromuscular disease (Sciatica)    GI/Hepatic GERD  Controlled,  Endo/Other  diabetes  Renal/GU      Musculoskeletal  (+) Arthritis ,   Abdominal   Peds  Hematology   Anesthesia Other Findings   Reproductive/Obstetrics                            Anesthesia Physical Anesthesia Plan  ASA: II  Anesthesia Plan: General   Post-op Pain Management:    Induction: Intravenous  PONV Risk Score and Plan: 2 and Ondansetron, Dexamethasone and Treatment may vary due to age or medical condition  Airway Management Planned: Oral ETT  Additional Equipment:   Intra-op Plan:   Post-operative Plan: Extubation in OR  Informed Consent: I have reviewed the patients History and Physical, chart, labs and discussed the procedure including the risks, benefits and alternatives for the proposed anesthesia with the patient or authorized representative who has indicated his/her understanding and acceptance.       Plan Discussed with: CRNA and Anesthesiologist  Anesthesia Plan Comments:         Anesthesia Quick Evaluation

## 2019-08-04 ENCOUNTER — Other Ambulatory Visit: Payer: Self-pay

## 2019-08-04 ENCOUNTER — Ambulatory Visit
Admission: RE | Admit: 2019-08-04 | Discharge: 2019-08-04 | Disposition: A | Discharge status: Home / Self Care | Payer: Medicare Other | Source: Ambulatory Visit | Attending: Otolaryngology | Admitting: Otolaryngology

## 2019-08-04 ENCOUNTER — Encounter
Admission: RE | Disposition: A | Discharge status: Home / Self Care | Payer: Self-pay | Source: Ambulatory Visit | Attending: Otolaryngology

## 2019-08-04 ENCOUNTER — Ambulatory Visit: Payer: Medicare Other | Admitting: Anesthesiology

## 2019-08-04 DIAGNOSIS — E785 Hyperlipidemia, unspecified: Secondary | ICD-10-CM | POA: Insufficient documentation

## 2019-08-04 DIAGNOSIS — R519 Headache, unspecified: Secondary | ICD-10-CM | POA: Diagnosis not present

## 2019-08-04 DIAGNOSIS — J45909 Unspecified asthma, uncomplicated: Secondary | ICD-10-CM | POA: Diagnosis not present

## 2019-08-04 DIAGNOSIS — J342 Deviated nasal septum: Secondary | ICD-10-CM | POA: Insufficient documentation

## 2019-08-04 DIAGNOSIS — E119 Type 2 diabetes mellitus without complications: Secondary | ICD-10-CM | POA: Insufficient documentation

## 2019-08-04 DIAGNOSIS — F329 Major depressive disorder, single episode, unspecified: Secondary | ICD-10-CM | POA: Insufficient documentation

## 2019-08-04 DIAGNOSIS — M1990 Unspecified osteoarthritis, unspecified site: Secondary | ICD-10-CM | POA: Diagnosis not present

## 2019-08-04 DIAGNOSIS — F419 Anxiety disorder, unspecified: Secondary | ICD-10-CM | POA: Diagnosis not present

## 2019-08-04 DIAGNOSIS — J321 Chronic frontal sinusitis: Secondary | ICD-10-CM | POA: Insufficient documentation

## 2019-08-04 DIAGNOSIS — E7800 Pure hypercholesterolemia, unspecified: Secondary | ICD-10-CM | POA: Insufficient documentation

## 2019-08-04 DIAGNOSIS — J343 Hypertrophy of nasal turbinates: Secondary | ICD-10-CM | POA: Insufficient documentation

## 2019-08-04 DIAGNOSIS — Z79899 Other long term (current) drug therapy: Secondary | ICD-10-CM | POA: Insufficient documentation

## 2019-08-04 DIAGNOSIS — J341 Cyst and mucocele of nose and nasal sinus: Secondary | ICD-10-CM | POA: Diagnosis present

## 2019-08-04 DIAGNOSIS — K219 Gastro-esophageal reflux disease without esophagitis: Secondary | ICD-10-CM | POA: Diagnosis not present

## 2019-08-04 DIAGNOSIS — Z792 Long term (current) use of antibiotics: Secondary | ICD-10-CM | POA: Diagnosis not present

## 2019-08-04 DIAGNOSIS — F17210 Nicotine dependence, cigarettes, uncomplicated: Secondary | ICD-10-CM | POA: Insufficient documentation

## 2019-08-04 HISTORY — PX: ETHMOIDECTOMY: SHX5197

## 2019-08-04 HISTORY — PX: NASAL SEPTOPLASTY W/ TURBINOPLASTY: SHX2070

## 2019-08-04 HISTORY — PX: IMAGE GUIDED SINUS SURGERY: SHX6570

## 2019-08-04 SURGERY — SINUS SURGERY, WITH IMAGING GUIDANCE
Anesthesia: General | Site: Nose | Laterality: Right

## 2019-08-04 MED ORDER — PHENYLEPHRINE HCL 0.5 % NA SOLN
NASAL | Status: DC | PRN
  Administered 2019-08-04: 15 mL via TOPICAL

## 2019-08-04 MED ORDER — PROMETHAZINE HCL 25 MG/ML IJ SOLN: 6.2500 mg | INTRAMUSCULAR | Status: DC | PRN

## 2019-08-04 MED ORDER — EPHEDRINE SULFATE 50 MG/ML IJ SOLN
INTRAMUSCULAR | Status: DC | PRN
  Administered 2019-08-04: 10 mg via INTRAVENOUS

## 2019-08-04 MED ORDER — OXYCODONE HCL 5 MG PO TABS
5.0000 mg | ORAL_TABLET | Freq: Once | ORAL | Status: AC | PRN
  Administered 2019-08-04: 5 mg via ORAL

## 2019-08-04 MED ORDER — HYDROMORPHONE HCL 1 MG/ML IJ SOLN: 0.2500 mg | INTRAMUSCULAR | Status: DC | PRN

## 2019-08-04 MED ORDER — ONDANSETRON HCL 4 MG/2ML IJ SOLN
4.0000 mg | Freq: Four times a day (QID) | INTRAMUSCULAR | Status: DC | PRN
  Administered 2019-08-04: 4 mg via INTRAVENOUS

## 2019-08-04 MED ORDER — LIDOCAINE HCL 4 % MT SOLN
OROMUCOSAL | Status: DC | PRN
  Administered 2019-08-04: 4 mL via TOPICAL

## 2019-08-04 MED ORDER — MIDAZOLAM HCL 5 MG/5ML IJ SOLN
INTRAMUSCULAR | Status: DC | PRN
  Administered 2019-08-04: 1 mg via INTRAVENOUS

## 2019-08-04 MED ORDER — FENTANYL CITRATE (PF) 100 MCG/2ML IJ SOLN
INTRAMUSCULAR | Status: DC | PRN
  Administered 2019-08-04 (×2): 25 ug via INTRAVENOUS
  Administered 2019-08-04: 50 ug via INTRAVENOUS

## 2019-08-04 MED ORDER — SUCCINYLCHOLINE CHLORIDE 20 MG/ML IJ SOLN
INTRAMUSCULAR | Status: DC | PRN
  Administered 2019-08-04: 80 mg via INTRAVENOUS

## 2019-08-04 MED ORDER — DEXAMETHASONE SODIUM PHOSPHATE 4 MG/ML IJ SOLN
INTRAMUSCULAR | Status: DC | PRN
  Administered 2019-08-04: 10 mg via INTRAVENOUS

## 2019-08-04 MED ORDER — OXYCODONE HCL 5 MG/5ML PO SOLN: 5.0000 mg | Freq: Once | ORAL | Status: AC | PRN

## 2019-08-04 MED ORDER — OXYMETAZOLINE HCL 0.05 % NA SOLN
2.0000 | Freq: Two times a day (BID) | NASAL | Status: DC
  Administered 2019-08-04: 12:00:00 2 via NASAL

## 2019-08-04 MED ORDER — PREDNISONE 10 MG PO TABS: ORAL_TABLET | ORAL | 0 refills | Status: DC

## 2019-08-04 MED ORDER — LIDOCAINE HCL (CARDIAC) PF 100 MG/5ML IV SOSY
PREFILLED_SYRINGE | INTRAVENOUS | Status: DC | PRN
  Administered 2019-08-04: 50 mg via INTRAVENOUS

## 2019-08-04 MED ORDER — PROPOFOL 10 MG/ML IV BOLUS
INTRAVENOUS | Status: DC | PRN
  Administered 2019-08-04: 110 mg via INTRAVENOUS

## 2019-08-04 MED ORDER — CLINDAMYCIN PHOSPHATE 600 MG/50ML IV SOLN
600.0000 mg | Freq: Once | INTRAVENOUS | Status: AC
  Administered 2019-08-04: 600 mg via INTRAVENOUS

## 2019-08-04 MED ORDER — HYDROCODONE-ACETAMINOPHEN 5-325 MG PO TABS: 1.0000 | ORAL_TABLET | Freq: Four times a day (QID) | ORAL | 0 refills | Status: AC | PRN

## 2019-08-04 MED ORDER — ONDANSETRON HCL 4 MG/2ML IJ SOLN
INTRAMUSCULAR | Status: DC | PRN
  Administered 2019-08-04: 4 mg via INTRAVENOUS

## 2019-08-04 MED ORDER — LIDOCAINE-EPINEPHRINE 1 %-1:100000 IJ SOLN
INTRAMUSCULAR | Status: DC | PRN
  Administered 2019-08-04: 6 mL

## 2019-08-04 MED ORDER — CLINDAMYCIN HCL 300 MG PO CAPS: 300.0000 mg | ORAL_CAPSULE | Freq: Three times a day (TID) | ORAL | 0 refills | Status: AC

## 2019-08-04 MED ORDER — LACTATED RINGERS IV SOLN
100.0000 mL/h | INTRAVENOUS | Status: DC
  Administered 2019-08-04: 100 mL/h via INTRAVENOUS

## 2019-08-04 MED ORDER — GLYCOPYRROLATE 0.2 MG/ML IJ SOLN
INTRAMUSCULAR | Status: DC | PRN
  Administered 2019-08-04: 0.1 mg via INTRAVENOUS

## 2019-08-04 SURGICAL SUPPLY — 34 items
BATTERY INSTRU NAVIGATION (MISCELLANEOUS) ×12 IMPLANT
CANISTER SUCT 1200ML W/VALVE (MISCELLANEOUS) ×4 IMPLANT
CATH IV 18X1 1/4 SAFELET (CATHETERS) ×4 IMPLANT
COAGULATOR SUCT 8FR VV (MISCELLANEOUS) ×4 IMPLANT
ELECT REM PT RETURN 9FT ADLT (ELECTROSURGICAL) ×4
ELECTRODE REM PT RTRN 9FT ADLT (ELECTROSURGICAL) ×2 IMPLANT
GLOVE PI ULTRA LF STRL 7.5 (GLOVE) ×4 IMPLANT
GLOVE PI ULTRA NON LATEX 7.5 (GLOVE) ×4
GOWN STRL REUS W/ TWL LRG LVL3 (GOWN DISPOSABLE) ×2 IMPLANT
GOWN STRL REUS W/TWL LRG LVL3 (GOWN DISPOSABLE) ×2
IV CATH 18X1 1/4 SAFELET (CATHETERS) ×2
IV NS 500ML (IV SOLUTION) ×2
IV NS 500ML BAXH (IV SOLUTION) ×2 IMPLANT
KIT TURNOVER KIT A (KITS) ×4 IMPLANT
NEEDLE HYPO 27GX1-1/4 (NEEDLE) ×4 IMPLANT
NS IRRIG 500ML POUR BTL (IV SOLUTION) ×4 IMPLANT
PACK ENT CUSTOM (PACKS) ×4 IMPLANT
PACKING NASAL EPIS 4X2.4 XEROG (MISCELLANEOUS) ×4 IMPLANT
PATTIES SURGICAL .5 X3 (DISPOSABLE) ×4 IMPLANT
SHAVER DIEGO BLD STD TYPE A (BLADE) ×4 IMPLANT
SOL ANTI-FOG 6CC FOG-OUT (MISCELLANEOUS) ×2 IMPLANT
SOL FOG-OUT ANTI-FOG 6CC (MISCELLANEOUS) ×2
SPLINT NASAL SEPTAL BLV .50 ST (MISCELLANEOUS) ×4 IMPLANT
STRAP BODY AND KNEE 60X3 (MISCELLANEOUS) ×4 IMPLANT
SUT CHROMIC 3-0 (SUTURE) ×2
SUT CHROMIC 3-0 KS 27XMFL CR (SUTURE) ×2
SUT ETHILON 3-0 KS 30 BLK (SUTURE) ×4 IMPLANT
SUT PLAIN GUT 4-0 (SUTURE) ×4 IMPLANT
SUTURE CHRMC 3-0 KS 27XMFL CR (SUTURE) ×2 IMPLANT
SYR 3ML LL SCALE MARK (SYRINGE) ×4 IMPLANT
TOWEL OR 17X26 4PK STRL BLUE (TOWEL DISPOSABLE) ×4 IMPLANT
TRACKER CRANIALMASK (MASK) ×4 IMPLANT
TUBING DECLOG MULTIDEBRIDER (TUBING) ×4 IMPLANT
WATER STERILE IRR 250ML POUR (IV SOLUTION) ×4 IMPLANT

## 2019-08-04 NOTE — Anesthesia Procedure Notes (Addendum)
Procedure Name: Intubation Date/Time: 08/04/2019 12:41 PM Performed by: Mayme Genta, CRNA Pre-anesthesia Checklist: Patient identified, Emergency Drugs available, Suction available, Patient being monitored and Timeout performed Patient Re-evaluated:Patient Re-evaluated prior to induction Oxygen Delivery Method: Circle system utilized Preoxygenation: Pre-oxygenation with 100% oxygen Induction Type: IV induction Ventilation: Mask ventilation without difficulty Laryngoscope Size: Miller and 2 Grade View: Grade II Tube type: Oral Rae Tube size: 7.0 mm Number of attempts: 1 Airway Equipment and Method: Bougie stylet Placement Confirmation: ETT inserted through vocal cords under direct vision,  positive ETCO2 and breath sounds checked- equal and bilateral Tube secured with: Tape Dental Injury: Teeth and Oropharynx as per pre-operative assessment  Difficulty Due To: Difficult Airway- due to limited oral opening and Difficult Airway- due to reduced neck mobility Comments: Unable to get ETT to pass through cords, boujie inserted without difficulty. ETT passed over boujie without issue. Tolerated well by pt.

## 2019-08-04 NOTE — Transfer of Care (Addendum)
Immediate Anesthesia Transfer of Care Note  Patient: Diane Anderson  Procedure(s) Performed: IMAGE GUIDED SINUS SURGERY (Right Nose) ETHMOIDECTOMY WITH FRONTAL SINUOTOMY (Right Nose) NASAL SEPTOPLASTY WITH TURBINATE REDUCTION (Bilateral Nose)  Patient Location: PACU  Anesthesia Type: General  Level of Consciousness: awake, alert  and patient cooperative  Airway and Oxygen Therapy: Patient Spontanous Breathing and Patient connected to supplemental oxygen  Post-op Assessment: Post-op Vital signs reviewed, Patient's Cardiovascular Status Stable, Respiratory Function Stable, Patent Airway and No signs of Nausea or vomiting  Post-op Vital Signs: Reviewed and stable  Complications: No apparent anesthesia complications

## 2019-08-04 NOTE — Op Note (Signed)
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  This was filling the anterior ethmoid and blocking the frontal sinus opening.  An anterior ethmoid air cells narrow it was filled with thick mucus and the frontal sinus also was filled with thick mucus-like rubber cement.  The septum was deviated to the right along the ethmoid plate blocking the upper airway on the right.  The inferior turbinates were enlarged.  Procedure: With the patient in a comfortable supine position,  general orotracheal anesthesia was induced without difficulty.     The patient received preoperative Afrin spray for topical decongestion and vasoconstriction.  Intravenous prophylactic antibiotics were administered.  At an appropriate level, the patient was placed in a semi-sitting position.  Nasal vibrissae were trimmed.   1% Xylocaine with 1:100,000 epinephrine, 6 cc's, was infiltrated into the anterior floor of the nose, into the nasal spine region, into the membranous columella, and finally into the submucoperichondrial plane of the septum on both sides.  Several minutes were allowed for this to take effect.  Cottoniod pledgetts soaked in Afrin and 4% Xylocaine were placed into both nasal cavities and left while the patient was prepped and draped in the standard fashion.  The image guided system was brought in and the CT scan was downloaded from the disc to the system.  The  template was applied to the face and then this was registered to the system.  There was point 8 mm of variance with several of the spots not lining up well.  The system was reregistered and this time there was 1 mm of variance.  The suction instruments were then registered and there was perfect alignment between the anatomy and the CT scan.  The materials were removed from the nose and observed to be intact and correct in number.  The nose was inspected with a headlight and zero degree scope with the findings as described above.  A left Killian incision was sharply executed and carried down to the quadrangular cartilage. The mucoperichondrium was elelvated along the quadrangular plate back to the bony-cartilaginous junction. The mucoperiostium was then elevated along the ethmoid plate and the vomer. The boney-catilaginous junction was then split with a freer elevator and the mucoperiosteum was elevated on the opposite side. The mucoperiosteum was then elevated along the maxillary crest as needed to expose the crooked bone of the crest.  Boney spurs of the vomer and maxillary crest were removed with Donavan Foil forceps.  The cartilaginous plate was trimmed along its posterior and inferior borders of about 2 mm of cartilage to free it up inferiorly. Some of the deviated ethmoid plate was then fractured and removed with Takahashi forceps to free up the posterior border of the quadrangular plate and allow it to swing back to the midline. The mucosal flaps were placed back into their anatomic position to allow visualization of the airways. The septum now sat in the midline with an improved airway.  A 3-0 Chromic suture on a Keith needle  in used to anchor the inferior septum at the nasal spine with a through and through suture. The mucosal flaps are then sutured together using a through and through whip stitch of 4-0 Plain Gut with a mini-Keith needle. This was used to close the [place] incision as well.  The 0  degree scope was used to visualize the left nasal passageway.  This was open and clear with no sign of any sinus disease.  The right nasal passageway was then visualized and the middle turbinate was medialized because of the ethmoid plate having pushed it over.  The septum was currently straight so the middle turbinate could be medialized.  The uncinate process was incised with the backbiter and was trimmed to allow better opening into the middle meatus.  He could see the mucocele at the agar nasi cell and just below it and anterior was a small cell underneath the uncinate process that was filled with thick glue-like mucus.  This was cleaned out.  The posterior ethmoid air cells were opened first and these were cleared out open them widely and allow good drainage from this area.  There is not any retained mucus in the posterior ethmoid sinuses.  Mucocele was then cleared anterior and inferior to it.  Mucocele was then opened and removed and had very thick watery clear mucus.  There is no odor to it and no sign of infection.  There was a thin bony eggshell that was around the mucocele that was still there and had filled out the entire anterior ethmoid and blocked the frontal sinus duct.  Using a small curette and a seeker I was able to free up this bone from the anterior and superior wall and remove this eggshell of the mucocele.  Once I got this out there was a small anterior ethmoid air cell that was evident that was clear.  Above that was the opening to the frontal sinus duct and once this was opened and clear very thick whitish glue-like mucus was suctioned from the frontal sinus.  The boss frontal sinus through biting instruments were used to widen the opening make sure this was nicely clear into the anterior ethmoid air cell.  This completed opening up the frontal sinus and the anterior ethmoids and the posterior ethmoids.  30 and 70 degree scopes were used for open up the frontal sinus area so I could see this  better.  The image guided system was used for landmarks and making sure the sinuses were all clear.  Xerogel was placed into the anterior and posterior ethmoids.  Another piece was placed at the more inferior opening to keep the middle turbinate medialized.  No xerogel was placed on the left side.  The inferior turbinates were then inspected. An incision was created along the inferior aspect of the left inferior turbinate with removal of some of the inferior soft tissue and bone. Electrocautery was used to control bleeding in the area. The remaining turbinate was then outfractured to open up the airway further. There was no significant bleeding noted. The right turbinate was then trimmed and outfractured in a similar fashion.  The airways were then visualized and showed open passageways on both sides that were significantly improved compared to before surgery. There was no signifcant bleeding. Nasal splints were applied to both sides of the septum using Xomed 0.44mm regular sized splints that were trimmed, and then held in position with a 3-0 Nylon through and through suture.  The patient was turned back over  to anesthesia, and awakened, extubated, and taken to the PACU in satisfactory condition.  Dispo:   PACU to home  Plan: Ice, elevation, narcotic analgesia, steroid taper, and prophylactic antibiotics for the duration of indwelling nasal foreign bodies.  We will reevaluate the patient in the office in 6 days and remove the septal splints.  Return to work in 10 days, strenuous activities in two weeks.   Elon Alas Linford Quintela 08/04/2019 2:23 PM

## 2019-08-04 NOTE — Anesthesia Postprocedure Evaluation (Signed)
Anesthesia Post Note  Patient: Diane Anderson  Procedure(s) Performed: IMAGE GUIDED SINUS SURGERY (Right Nose) ETHMOIDECTOMY WITH FRONTAL SINUOTOMY (Right Nose) NASAL SEPTOPLASTY WITH TURBINATE REDUCTION (Bilateral Nose)     Patient location during evaluation: PACU Anesthesia Type: General Level of consciousness: awake and alert Pain management: pain level controlled Vital Signs Assessment: post-procedure vital signs reviewed and stable Respiratory status: spontaneous breathing, nonlabored ventilation, respiratory function stable and patient connected to nasal cannula oxygen Cardiovascular status: blood pressure returned to baseline and stable Postop Assessment: no apparent nausea or vomiting Anesthetic complications: no    Chai Verdejo A  [name]

## 2019-08-04 NOTE — H&P (Signed)
H&P has been reviewed and patient reevaluated, no changes necessary. To be downloaded later.  

## 2019-08-05 ENCOUNTER — Encounter: Payer: Self-pay | Admitting: Otolaryngology

## 2019-08-08 LAB — SURGICAL PATHOLOGY

## 2019-09-20 DIAGNOSIS — Z48813 Encounter for surgical aftercare following surgery on the respiratory system: Secondary | ICD-10-CM | POA: Diagnosis not present

## 2019-10-25 DIAGNOSIS — R42 Dizziness and giddiness: Secondary | ICD-10-CM | POA: Diagnosis not present

## 2019-10-25 DIAGNOSIS — J321 Chronic frontal sinusitis: Secondary | ICD-10-CM | POA: Diagnosis not present

## 2019-10-25 DIAGNOSIS — S01309A Unspecified open wound of unspecified ear, initial encounter: Secondary | ICD-10-CM | POA: Diagnosis not present

## 2019-10-27 DIAGNOSIS — R42 Dizziness and giddiness: Secondary | ICD-10-CM | POA: Diagnosis not present

## 2019-10-27 DIAGNOSIS — R809 Proteinuria, unspecified: Secondary | ICD-10-CM | POA: Diagnosis not present

## 2019-10-27 DIAGNOSIS — J301 Allergic rhinitis due to pollen: Secondary | ICD-10-CM | POA: Diagnosis not present

## 2019-10-27 DIAGNOSIS — E559 Vitamin D deficiency, unspecified: Secondary | ICD-10-CM | POA: Diagnosis not present

## 2019-10-27 DIAGNOSIS — E7800 Pure hypercholesterolemia, unspecified: Secondary | ICD-10-CM | POA: Diagnosis not present

## 2019-10-27 DIAGNOSIS — Z0000 Encounter for general adult medical examination without abnormal findings: Secondary | ICD-10-CM | POA: Diagnosis not present

## 2019-10-27 DIAGNOSIS — E1129 Type 2 diabetes mellitus with other diabetic kidney complication: Secondary | ICD-10-CM | POA: Diagnosis not present

## 2019-10-27 DIAGNOSIS — F3341 Major depressive disorder, recurrent, in partial remission: Secondary | ICD-10-CM | POA: Diagnosis not present

## 2019-10-27 DIAGNOSIS — K219 Gastro-esophageal reflux disease without esophagitis: Secondary | ICD-10-CM | POA: Diagnosis not present

## 2020-01-10 DIAGNOSIS — R42 Dizziness and giddiness: Secondary | ICD-10-CM | POA: Diagnosis not present

## 2020-01-10 DIAGNOSIS — J0140 Acute pansinusitis, unspecified: Secondary | ICD-10-CM | POA: Diagnosis not present

## 2020-04-25 DIAGNOSIS — K219 Gastro-esophageal reflux disease without esophagitis: Secondary | ICD-10-CM | POA: Diagnosis not present

## 2020-04-25 DIAGNOSIS — E559 Vitamin D deficiency, unspecified: Secondary | ICD-10-CM | POA: Diagnosis not present

## 2020-04-25 DIAGNOSIS — E1129 Type 2 diabetes mellitus with other diabetic kidney complication: Secondary | ICD-10-CM | POA: Diagnosis not present

## 2020-04-25 DIAGNOSIS — R809 Proteinuria, unspecified: Secondary | ICD-10-CM | POA: Diagnosis not present

## 2020-04-25 DIAGNOSIS — R42 Dizziness and giddiness: Secondary | ICD-10-CM | POA: Diagnosis not present

## 2020-04-25 DIAGNOSIS — E7800 Pure hypercholesterolemia, unspecified: Secondary | ICD-10-CM | POA: Diagnosis not present

## 2020-04-25 DIAGNOSIS — F3341 Major depressive disorder, recurrent, in partial remission: Secondary | ICD-10-CM | POA: Diagnosis not present

## 2020-04-25 DIAGNOSIS — J301 Allergic rhinitis due to pollen: Secondary | ICD-10-CM | POA: Diagnosis not present

## 2020-05-01 DIAGNOSIS — H26493 Other secondary cataract, bilateral: Secondary | ICD-10-CM | POA: Diagnosis not present

## 2020-05-03 ENCOUNTER — Other Ambulatory Visit: Payer: Self-pay | Admitting: General Surgery

## 2020-05-03 DIAGNOSIS — Z1231 Encounter for screening mammogram for malignant neoplasm of breast: Secondary | ICD-10-CM

## 2020-06-07 DIAGNOSIS — H26493 Other secondary cataract, bilateral: Secondary | ICD-10-CM | POA: Diagnosis not present

## 2020-06-15 ENCOUNTER — Other Ambulatory Visit: Payer: Self-pay

## 2020-06-15 ENCOUNTER — Ambulatory Visit
Admission: RE | Admit: 2020-06-15 | Discharge: 2020-06-15 | Disposition: A | Discharge status: Home / Self Care | Payer: PPO | Source: Ambulatory Visit | Attending: General Surgery | Admitting: General Surgery

## 2020-06-15 DIAGNOSIS — Z1231 Encounter for screening mammogram for malignant neoplasm of breast: Secondary | ICD-10-CM | POA: Insufficient documentation

## 2020-06-26 ENCOUNTER — Ambulatory Visit: Payer: Medicare Other

## 2020-07-05 ENCOUNTER — Emergency Department
Admission: EM | Admit: 2020-07-05 | Discharge: 2020-07-06 | Disposition: A | Discharge status: Home / Self Care | Payer: PPO | Attending: Emergency Medicine | Admitting: Emergency Medicine

## 2020-07-05 ENCOUNTER — Encounter: Payer: Self-pay | Admitting: Intensive Care

## 2020-07-05 ENCOUNTER — Emergency Department: Payer: PPO

## 2020-07-05 ENCOUNTER — Other Ambulatory Visit: Payer: Self-pay

## 2020-07-05 DIAGNOSIS — R1032 Left lower quadrant pain: Secondary | ICD-10-CM | POA: Insufficient documentation

## 2020-07-05 DIAGNOSIS — R112 Nausea with vomiting, unspecified: Secondary | ICD-10-CM | POA: Insufficient documentation

## 2020-07-05 DIAGNOSIS — I10 Essential (primary) hypertension: Secondary | ICD-10-CM | POA: Diagnosis not present

## 2020-07-05 DIAGNOSIS — R42 Dizziness and giddiness: Secondary | ICD-10-CM

## 2020-07-05 DIAGNOSIS — N3090 Cystitis, unspecified without hematuria: Secondary | ICD-10-CM | POA: Diagnosis not present

## 2020-07-05 DIAGNOSIS — F17210 Nicotine dependence, cigarettes, uncomplicated: Secondary | ICD-10-CM | POA: Diagnosis not present

## 2020-07-05 DIAGNOSIS — R1111 Vomiting without nausea: Secondary | ICD-10-CM | POA: Diagnosis not present

## 2020-07-05 DIAGNOSIS — I700 Atherosclerosis of aorta: Secondary | ICD-10-CM | POA: Insufficient documentation

## 2020-07-05 DIAGNOSIS — E860 Dehydration: Secondary | ICD-10-CM | POA: Insufficient documentation

## 2020-07-05 DIAGNOSIS — E279 Disorder of adrenal gland, unspecified: Secondary | ICD-10-CM | POA: Diagnosis not present

## 2020-07-05 DIAGNOSIS — I714 Abdominal aortic aneurysm, without rupture, unspecified: Secondary | ICD-10-CM

## 2020-07-05 DIAGNOSIS — I517 Cardiomegaly: Secondary | ICD-10-CM | POA: Diagnosis not present

## 2020-07-05 DIAGNOSIS — R059 Cough, unspecified: Secondary | ICD-10-CM | POA: Insufficient documentation

## 2020-07-05 DIAGNOSIS — Z20822 Contact with and (suspected) exposure to covid-19: Secondary | ICD-10-CM | POA: Diagnosis not present

## 2020-07-05 DIAGNOSIS — Z79899 Other long term (current) drug therapy: Secondary | ICD-10-CM | POA: Insufficient documentation

## 2020-07-05 DIAGNOSIS — E119 Type 2 diabetes mellitus without complications: Secondary | ICD-10-CM | POA: Insufficient documentation

## 2020-07-05 DIAGNOSIS — R0902 Hypoxemia: Secondary | ICD-10-CM | POA: Diagnosis not present

## 2020-07-05 DIAGNOSIS — R110 Nausea: Secondary | ICD-10-CM | POA: Diagnosis not present

## 2020-07-05 DIAGNOSIS — E278 Other specified disorders of adrenal gland: Secondary | ICD-10-CM

## 2020-07-05 DIAGNOSIS — R55 Syncope and collapse: Secondary | ICD-10-CM | POA: Insufficient documentation

## 2020-07-05 DIAGNOSIS — G4489 Other headache syndrome: Secondary | ICD-10-CM | POA: Diagnosis not present

## 2020-07-05 DIAGNOSIS — R1110 Vomiting, unspecified: Secondary | ICD-10-CM | POA: Diagnosis not present

## 2020-07-05 LAB — CBC
HCT: 43.2 % (ref 36.0–46.0)
Hemoglobin: 14.9 g/dL (ref 12.0–15.0)
MCH: 29.2 pg (ref 26.0–34.0)
MCHC: 34.5 g/dL (ref 30.0–36.0)
MCV: 84.7 fL (ref 80.0–100.0)
Platelets: 291 10*3/uL (ref 150–400)
RBC: 5.1 MIL/uL (ref 3.87–5.11)
RDW: 13.1 % (ref 11.5–15.5)
WBC: 13.2 10*3/uL — ABNORMAL HIGH (ref 4.0–10.5)
nRBC: 0 % (ref 0.0–0.2)

## 2020-07-05 LAB — BASIC METABOLIC PANEL
Anion gap: 11 (ref 5–15)
BUN: 16 mg/dL (ref 8–23)
CO2: 25 mmol/L (ref 22–32)
Calcium: 9.6 mg/dL (ref 8.9–10.3)
Chloride: 95 mmol/L — ABNORMAL LOW (ref 98–111)
Creatinine, Ser: 0.78 mg/dL (ref 0.44–1.00)
GFR, Estimated: >60 mL/min (ref >60)
Glucose, Bld: 129 mg/dL — ABNORMAL HIGH (ref 70–99)
Potassium: 4.3 mmol/L (ref 3.5–5.1)
Sodium: 131 mmol/L — ABNORMAL LOW (ref 135–145)

## 2020-07-05 LAB — URINALYSIS, COMPLETE (UACMP) WITH MICROSCOPIC
Bacteria, UA: NONE SEEN
Bilirubin Urine: NEGATIVE
Glucose, UA: NEGATIVE mg/dL
Hgb urine dipstick: NEGATIVE
Ketones, ur: NEGATIVE mg/dL
Nitrite: NEGATIVE
Protein, ur: NEGATIVE mg/dL
Specific Gravity, Urine: >1.046 — ABNORMAL HIGH (ref 1.005–1.030)
pH: 6 (ref 5.0–8.0)

## 2020-07-05 LAB — RESPIRATORY PANEL BY RT PCR (FLU A&B, COVID)
Influenza A by PCR: NEGATIVE
Influenza B by PCR: NEGATIVE
SARS Coronavirus 2 by RT PCR: NEGATIVE

## 2020-07-05 LAB — HEPATIC FUNCTION PANEL
ALT: 12 U/L (ref 0–44)
AST: 21 U/L (ref 15–41)
Albumin: 4.2 g/dL (ref 3.5–5.0)
Alkaline Phosphatase: 78 U/L (ref 38–126)
Bilirubin, Direct: 0.1 mg/dL (ref 0.0–0.2)
Indirect Bilirubin: 0.4 mg/dL (ref 0.3–0.9)
Total Bilirubin: 0.5 mg/dL (ref 0.3–1.2)
Total Protein: 7.3 g/dL (ref 6.5–8.1)

## 2020-07-05 LAB — TROPONIN I (HIGH SENSITIVITY): Troponin I (High Sensitivity): 6 ng/L (ref <18)

## 2020-07-05 LAB — LIPASE, BLOOD: Lipase: 28 U/L (ref 11–51)

## 2020-07-05 LAB — LACTIC ACID, PLASMA: Lactic Acid, Venous: 1.4 mmol/L (ref 0.5–1.9)

## 2020-07-05 MED ORDER — SULFAMETHOXAZOLE-TRIMETHOPRIM 800-160 MG PO TABS: 1.0000 | ORAL_TABLET | Freq: Two times a day (BID) | ORAL | 0 refills | Status: AC

## 2020-07-05 MED ORDER — LACTATED RINGERS IV BOLUS
1000.0000 mL | Freq: Once | INTRAVENOUS | Status: AC
  Administered 2020-07-05: 1000 mL via INTRAVENOUS

## 2020-07-05 MED ORDER — IOHEXOL 300 MG/ML  SOLN
100.0000 mL | Freq: Once | INTRAMUSCULAR | Status: AC | PRN
  Administered 2020-07-05: 100 mL via INTRAVENOUS

## 2020-07-05 MED ORDER — PROCHLORPERAZINE EDISYLATE 10 MG/2ML IJ SOLN
10.0000 mg | Freq: Once | INTRAMUSCULAR | Status: AC
  Administered 2020-07-05: 10 mg via INTRAVENOUS
  Filled 2020-07-05: qty 2

## 2020-07-05 MED ORDER — DIAZEPAM 2 MG PO TABS
1.0000 mg | ORAL_TABLET | Freq: Once | ORAL | Status: AC
  Administered 2020-07-05: 1 mg via ORAL
  Filled 2020-07-05: qty 1

## 2020-07-05 MED ORDER — ACETAMINOPHEN 500 MG PO TABS
1000.0000 mg | ORAL_TABLET | Freq: Once | ORAL | Status: AC
  Administered 2020-07-05: 1000 mg via ORAL
  Filled 2020-07-05: qty 2

## 2020-07-05 NOTE — ED Provider Notes (Signed)
[facility] Emergency Department Provider Note  ____________________________________________   First MD Initiated Contact with Patient 07/05/20 1740     (approximate)  I have reviewed the triage vital signs and the nursing notes.   HISTORY  Chief Complaint Dizziness   HPI Diane Anderson is a 74 y.o. female with a past medical history of HTN, HPL, DM, depression, asthma, anxiety, arthritis, and vertigo who presents for assessment of several complaints including vertigo that began last night this feels significantly worse than her typical vertigo.  Patient states she has felt headache and has had nausea and nonbloody nonbilious vomiting since last night.  She also states he has been coughing for the last several days and has developed pain in her left lower quadrant of her abdomen today.  She states she think she is little constipated and denies any diarrhea or urinary symptoms.  She denies any shortness of breath, chest pain, back pain, rash, extremity pain, acute complaints.  She states she has been taking meclizine and another medicine prescribed by her PCP but this has not helped with her vertigo.  She denies EtOH or illicit drug use.  No other clear alleviating or aggravating factors and other than ambulation with seems to severely worsen her vertigo.         Past Medical History:  Diagnosis Date  . Allergy    Seasonal   . Anxiety   . Arthritis    left knee, left shoulder  . Asthma   . Depression   . Diabetes mellitus without complication ([place])    diet controlled after weight loss  . Discharge from left nipple 01/28/2017  . Fibrocystic breast disease   . GERD (gastroesophageal reflux disease)   . Hx of migraines   . Hypercholesteremia   . Hyperlipidemia   . Vertigo    avg 1x/mo  . Vitamin D deficiency   . Wears dentures    full upper    Patient Active Problem List   Diagnosis Date Noted  . Vomiting 04/26/2019  . Chronic right  shoulder pain 02/10/2019  . Localized osteoarthritis of hands, bilateral 02/10/2019  . Chronic midline low back pain with sciatica 12/27/2018  . Hypercholesteremia   . Allergy   . Hx of migraines   . Vitamin D deficiency   . Acid reflux 03/13/2016  . Pure hypercholesterolemia 03/13/2016  . Type 2 diabetes mellitus ([place]) 08/21/2015  . Recurrent major depressive disorder, in partial remission ([place]) 08/21/2015  . Clinical depression 05/30/2014  . Anxiety 01/19/2014  . Avitaminosis D 01/19/2014    Past Surgical History:  Procedure Laterality Date  . BREAST BIOPSY Right 2011   stereo bx benign  . BREAST BIOPSY Left 04/18/2016   x3. 3:00 2 cmfn benign hyalinizing stroma cyst formation, 3:00 5cmfn benign ductal ectasia, 10:00 intraductal papilloma.  Marland Kitchen BREAST EXCISIONAL BIOPSY Right yrs ago    benign  . BREAST EXCISIONAL BIOPSY Left 05/15/2016   excision to remove papilloma at 10:00 location  . CATARACT EXTRACTION W/PHACO Left 08/10/2018   Procedure: CATARACT EXTRACTION PHACO AND INTRAOCULAR LENS PLACEMENT ([place])  LEFT DIABETIC;  Surgeon: Leandrew Koyanagi, MD;  Location: [place];  Service: Ophthalmology;  Laterality: Left;  Diabetic - diet controlled  . CATARACT EXTRACTION W/PHACO Right 08/31/2018   Procedure: CATARACT EXTRACTION PHACO AND INTRAOCULAR LENS PLACEMENT ([place])  RIGHT DIABETIC;  Surgeon: Leandrew Koyanagi, MD;  Location: [place];  Service: Ophthalmology;  Laterality: Right;  Diabetic - diet controlled  . CYST  EXCISION     Dr. Pat Patrick  . ETHMOIDECTOMY Right 08/04/2019   Procedure: ETHMOIDECTOMY WITH FRONTAL SINUOTOMY;  Surgeon: Margaretha Sheffield, MD;  Location: [place];  Service: ENT;  Laterality: Right;  Diabetic - diet controlled  . EXCISION OF BREAST BIOPSY Left 05/15/2016   Procedure: EXCISION OF BREAST BIOPSY;  Surgeon: Hubbard Robinson, MD;  Location: [facility];  Service: General;  Laterality: Left;  . IMAGE GUIDED SINUS SURGERY Right  08/04/2019   Procedure: IMAGE GUIDED SINUS SURGERY;  Surgeon: Margaretha Sheffield, MD;  Location: [place];  Service: ENT;  Laterality: Right;  disk on OR charge nurse desk 11-16 kp  . LIPOMA EXCISION  1995   Right Shoulder  . LYMPH GLAND EXCISION Right 2011  . NASAL SEPTOPLASTY W/ TURBINOPLASTY Bilateral 08/04/2019   Procedure: NASAL SEPTOPLASTY WITH TURBINATE REDUCTION;  Surgeon: Margaretha Sheffield, MD;  Location: [place];  Service: ENT;  Laterality: Bilateral;  . TONSILLECTOMY    . TUBAL LIGATION      Prior to Admission medications   Medication Sig Start Date End Date Taking? Authorizing Provider  ACETAMINOPHEN PO Take by mouth as needed.    [provider]  citalopram (CELEXA) 40 MG tablet Take 1 tablet by mouth daily. 02/04/16   [provider]  fluticasone (FLONASE) 50 MCG/ACT nasal spray Place 2 sprays into the nose daily as needed.  01/24/16   [provider]  lisinopril (PRINIVIL,ZESTRIL) 2.5 MG tablet Take 1 tablet by mouth daily. 04/04/16   [provider]  montelukast (SINGULAIR) 10 MG tablet Take 1 tablet by mouth at bedtime. 04/06/17 07/27/19  [provider]  omeprazole (PRILOSEC) 20 MG capsule Take 1 capsule by mouth daily as needed.  02/04/16   [provider]  ondansetron (ZOFRAN ODT) 4 MG disintegrating tablet Take 1 tablet (4 mg total) by mouth every 6 (six) hours as needed for nausea or vomiting. 05/09/19   Delman Kitten, MD  predniSONE (DELTASONE) 10 MG tablet Start with 3 pills tomorrow. Taper over the next 6 days.  3,3,2,2,1,1. 08/04/19   Margaretha Sheffield, MD  rosuvastatin (CRESTOR) 20 MG tablet Take 20 mg by mouth daily. 03/04/18   [provider]  sulfamethoxazole-trimethoprim (BACTRIM DS) 800-160 MG tablet Take 1 tablet by mouth 2 (two) times daily for 7 days. 07/05/20 07/12/20  Lucrezia Starch, MD  Vitamin D, Ergocalciferol, (DRISDOL) 50000 units CAPS capsule Take 50,000 Units by mouth every 7 (seven)  days.    [provider]    Allergies Cyclobenzaprine, Penicillins, Adhesive [tape], and Atorvastatin  Family History  Problem Relation Age of Onset  . Dementia Mother   . Cancer Mother        skin  . Osteoporosis Mother   . Alzheimer's disease Father   . Asthma Father   . Hypertension Father   . Breast cancer Maternal Aunt 33  . Cancer Maternal Aunt        Breast Cancer  . Hypertension Maternal Aunt   . Birth defects Cousin 40       Breast  . Breast cancer Cousin   . Breast cancer Cousin   . Hypertension Sister   . Diabetes Paternal Grandmother   . Breast cancer Maternal Grandfather     Social History Social History   Tobacco Use  . Smoking status: Current Every Day Smoker    Packs/day: 1.00    Years: 52.00    Pack years: 52.00    Types: Cigarettes  . Smokeless tobacco:  Never Used  Vaping Use  . Vaping Use: Never used  Substance Use Topics  . Alcohol use: No    Alcohol/week: 0.0 standard drinks    Comment: rarely  . Drug use: No    Review of Systems  Review of Systems  Constitutional: Negative for chills and fever.  HENT: Negative for sore throat.   Eyes: Positive for photophobia. Negative for pain.  Respiratory: Positive for cough. Negative for stridor.   Cardiovascular: Negative for chest pain.  Gastrointestinal: Positive for abdominal pain, constipation, nausea and vomiting.  Genitourinary: Negative for dysuria.  Musculoskeletal: Negative for myalgias.  Skin: Negative for rash.  Neurological: Positive for dizziness. Negative for seizures, loss of consciousness and headaches.  Psychiatric/Behavioral: Negative for suicidal ideas.  All other systems reviewed and are negative.     ____________________________________________   PHYSICAL EXAM:  VITAL SIGNS: ED Triage Vitals  Enc Vitals Group     BP 07/05/20 1430 (!) 122/53     Pulse Rate 07/05/20 1430 66     Resp 07/05/20 1430 16     Temp 07/05/20 1430 98.9 F (37.2 C)     Temp  Source 07/05/20 1430 Oral     SpO2 07/05/20 1430 96 %     Weight 07/05/20 1431 160 lb (72.6 kg)     Height 07/05/20 1431 5\' 3"  (1.6 m)     Head Circumference --      Peak Flow --      Pain Score 07/05/20 1431 9     Pain Loc --      Pain Edu? --      Excl. in [place]? --    Vitals:   07/05/20 2030 07/05/20 2232  BP: (!) 148/57 (!) 152/53  Pulse: 69 81  Resp: 16 16  Temp:    SpO2: 98% 96%   Physical Exam Vitals and nursing note reviewed.  Constitutional:      General: She is not in acute distress.    Appearance: She is well-developed.  HENT:     Head: Normocephalic and atraumatic.     Right Ear: External ear normal.     Left Ear: External ear normal.     Nose: Nose normal.     Mouth/Throat:     Mouth: Mucous membranes are dry.  Eyes:     Conjunctiva/sclera: Conjunctivae normal.  Cardiovascular:     Rate and Rhythm: Normal rate and regular rhythm.     Heart sounds: No murmur heard.   Pulmonary:     Effort: Pulmonary effort is normal. No respiratory distress.     Breath sounds: Normal breath sounds.  Abdominal:     Palpations: Abdomen is soft.     Tenderness: There is abdominal tenderness in the left lower quadrant. There is no right CVA tenderness or left CVA tenderness.  Musculoskeletal:     Cervical back: Neck supple.  Skin:    General: Skin is warm and dry.     Capillary Refill: Capillary refill takes 2 to 3 seconds.  Neurological:     Mental Status: She is alert and oriented to person, place, and time.  Psychiatric:        Mood and Affect: Mood normal.     No pronator drift.  No finger dysmetria.  Cranial nerves II through XII grossly intact.  Patient has full and symmetric strength on her bilateral upper and lower extremities.  Sensation is intact to light touch throughout all extremities. ____________________________________________   LABS (all labs ordered are  listed, but only abnormal results are displayed)  Labs Reviewed  BASIC METABOLIC PANEL - Abnormal;  Notable for the following components:      Result Value   Sodium 131 (*)    Chloride 95 (*)    Glucose, Bld 129 (*)    All other components within normal limits  CBC - Abnormal; Notable for the following components:   WBC 13.2 (*)    All other components within normal limits  URINALYSIS, COMPLETE (UACMP) WITH MICROSCOPIC - Abnormal; Notable for the following components:   Color, Urine YELLOW (*)    APPearance CLEAR (*)    Specific Gravity, Urine >1.046 (*)    Leukocytes,Ua TRACE (*)    All other components within normal limits  RESPIRATORY PANEL BY RT PCR (FLU A&B, COVID)  URINE CULTURE  LACTIC ACID, PLASMA  HEPATIC FUNCTION PANEL  LIPASE, BLOOD  CBG MONITORING, ED  TROPONIN I (HIGH SENSITIVITY)   ____________________________________________  EKG  Sinus rhythm with a ventricular rate of 69, normal axis, unremarkable intervals, nonspecific ST change in V2 and lead III without other evidence of acute ischemia or other significant underlying arrhythmia. ____________________________________________  RADIOLOGY  ED MD interpretation: Chest x-ray shows no evidence of pneumonia, no thorax, significant edema, or other clear acute intrathoracic process.  Official radiology report(s): DG Chest 2 View  Result Date: 07/05/2020 CLINICAL DATA:  Cough EXAM: CHEST - 2 VIEW COMPARISON:  05/08/2019 FINDINGS: Mild cardiomegaly. No pleural effusion or focal consolidation. Mild diffuse interstitial opacity with streaky basilar opacities, likely chronic interstitial change. Aortic atherosclerosis. No pneumothorax. IMPRESSION: Mild cardiomegaly. Probable chronic interstitial changes at the bases. Electronically Signed   By: Donavan Foil M.D.   On: 07/05/2020 18:34   MR BRAIN WO CONTRAST  Result Date: 07/05/2020 CLINICAL DATA:  Initial evaluation for acute dizziness. EXAM: MRI HEAD WITHOUT CONTRAST TECHNIQUE: Multiplanar, multiecho pulse sequences of the brain and surrounding structures were  obtained without intravenous contrast. COMPARISON:  Prior CTA from 07/05/2019. FINDINGS: Brain: Generalized age-related cerebral atrophy. Patchy and confluent T2/FLAIR hyperintensity within the periventricular deep white matter both cerebral hemispheres most consistent with chronic small vessel ischemic disease, moderate to advanced in nature. Patchy involvement of the pons noted. 9 mm cystic lesion at the inferior right basal ganglia favored to reflect a dilated perivascular space. No abnormal foci of restricted diffusion to suggest acute or subacute ischemia. Gray-white matter differentiation maintained. No other areas of encephalomalacia to suggest chronic cortical infarction. No foci of susceptibility artifact to suggest acute or chronic intracranial hemorrhage. Approximate 2.6 cm benign arachnoid cyst noted at the anterior left middle cranial fossa. No other mass lesion, mass effect, or midline shift. No hydrocephalus or other extra-axial fluid collection. Pituitary gland suprasellar region within normal limits. Midline structures intact. Vascular: Chronic left ICA occlusion to the level of the terminus noted. Major intracranial vascular flow voids otherwise maintained. Skull and upper cervical spine: Craniocervical junction within normal limits. Bone marrow signal intensity normal. No scalp soft tissue abnormality. Sinuses/Orbits: Patient status post bilateral ocular lens replacement. Globes and orbital soft tissues within normal limits. Mild scattered mucosal thickening noted within the ethmoidal air cells. Paranasal sinuses are otherwise clear. No mastoid effusion. Inner ear structures grossly normal. Other: None. IMPRESSION: 1. No acute intracranial abnormality. 2. Generalized age-related cerebral atrophy with moderate to advanced chronic small vessel ischemic disease. 3. Chronic left ICA occlusion. Electronically Signed   By: Jeannine Boga M.D.   On: 07/05/2020 20:21   CT ABDOMEN PELVIS W  CONTRAST  Result Date: 07/05/2020 CLINICAL DATA:  Abdomen pain with nausea and vomiting EXAM: CT ABDOMEN AND PELVIS WITH CONTRAST TECHNIQUE: Multidetector CT imaging of the abdomen and pelvis was performed using the standard protocol following bolus administration of intravenous contrast. CONTRAST:  180mL OMNIPAQUE IOHEXOL 300 MG/ML  SOLN COMPARISON:  None. FINDINGS: Lower chest: Lung bases demonstrate no acute consolidation or effusion. 9 mm nodular density in the left lung base, series 4, image number 10, appears to be associated with pulmonary vessels. Borderline cardiomegaly. Hepatobiliary: No focal liver abnormality is seen. No gallstones, gallbladder wall thickening, or biliary dilatation. Pancreas: Unremarkable. No pancreatic ductal dilatation or surrounding inflammatory changes. Spleen: Normal in size without focal abnormality. Adrenals/Urinary Tract: Right adrenal gland is normal. Diffusely enlarged left adrenal gland with 17 x 4.4 cm left adrenal mass. Kidneys show no hydronephrosis. Asymmetric renal size, left smaller than right. Urinary bladder is distended. Punctate focus of air, potentially due to recent instrumentation. Non simple hypodense lesion in the mid to upper right kidney measuring 15 mm, series 2, image number 21. Stomach/Bowel: Stomach nonenlarged. No dilated small bowel. High-riding cecum in the right upper quadrant. Appendix not clearly identified. Vascular/Lymphatic: No suspicious adenopathy. Marked aortic atherosclerosis. Infrarenal abdominal aortic aneurysm measuring 3.8 cm maximum diameter. Reproductive: Uterus and bilateral adnexa are unremarkable. Other: Negative for free air or free fluid. Musculoskeletal: No acute or significant osseous findings. IMPRESSION: 1. No CT evidence for acute intra-abdominal or pelvic abnormality. 2. Diffusely enlarged left adrenal gland with 4.4 cm indeterminate left adrenal mass. Consider correlation with nonemergent adrenal CT. 3. 3.8 cm infrarenal  abdominal aortic aneurysm. Recommend follow-up ultrasound every 2 years. This recommendation follows ACR consensus guidelines: White Paper of the ACR Incidental Findings Committee II on Vascular Findings. J Am Coll Radiol 2013; 10:789-794. 4. 9 mm nodular density in the left lung base, appears to be associated with pulmonary vessels and may represent small vascular malformation. 5. 15 mm indeterminate hypodense lesion in the mid to upper right kidney. When the patient is clinically stable and able to follow directions and hold their breath (preferably as an outpatient) further evaluation with dedicated abdominal MRI should be considered. 6. Punctate focus of air in the urinary bladder, correlate for recent instrumentation. 7. Asymmetrically small left kidney suggesting atrophy. 8. Aortic atherosclerosis. Aortic Atherosclerosis (ICD10-I70.0). Electronically Signed   By: Donavan Foil M.D.   On: 07/05/2020 18:48    ____________________________________________   PROCEDURES  Procedure(s) performed (including Critical Care):  .1-3 Lead EKG Interpretation Performed by: Lucrezia Starch, MD Authorized by: Lucrezia Starch, MD     Interpretation: normal     ECG rate assessment: normal     Rhythm: sinus rhythm     Ectopy: none     Conduction: normal       ____________________________________________   INITIAL IMPRESSION / ASSESSMENT AND PLAN / ED COURSE        Patient presents with above-stated history exam for assessment of multiple symptoms including vertigo associate with headache and vomiting that feels worse than her typical vertigo as well as cough and some left lower quadrant abdominal pain.  Patient is slightly hypertensive with otherwise stable vital signs on arrival.  Exam as above remarkable for patient that appears uncomfortable but has no focal neurological deficits and some mild tenderness in her left lower quadrant.  Patient also appears slightly dehydrated with prolonged  capillary refill and dry mucous membranes.  Differential includes but is not limited to CVA, metabolic derangements, known peripheral vertigo, pneumonia,  Covid, postnasal drip, GERD, ACS, diverticulitis, cystitis, and other acute intra-abdominal pathologies.  Low suspicion for ACS given patient denies any chest pain has a reassuring EKG with nonelevated troponin..  Chest x-ray obtained shows no evidence of pneumonia.  CT abdomen pelvis obtained remarkable for no acute findings including evidence of diverticulitis, pancreatitis, cystitis, pyelonephritis, or cholecystitis.  CT does show multiple incidental findings including adrenal mass, abdominal aortic aneurysm, significant atherosclerosis, and likely vascular lesion of the left lower lung..  Lipase is not consistent with acute pancreatitis and CMP shows no evidence of acute cholestasis.  MR brain shows no evidence of CVA.  UA shows evidence of possible cystitis.  Will write Rx to cover for this.  Impression is dehydration from vomiting secondary to severe vertigo.  Patient treated with below noted medications and on reassessment states he felt much improved.  Given stable vitals with otherwise reassuring work-up and patient stating she felt better I believe she is safe for discharge with plan for continued outpatient follow-up.  Discharged stable condition.  ____________________________________________   FINAL CLINICAL IMPRESSION(S) / ED DIAGNOSES  Final diagnoses:  Vertigo  Adrenal mass ([place])  Abdominal aortic aneurysm (AAA) 3.0 cm to 5.0 cm in diameter in female [facility])  Aortic atherosclerosis ([facility])  Dehydration  Cough  Hypertension, unspecified type  Cystitis    Medications  lactated ringers bolus 1,000 mL (0 mLs Intravenous Stopped 07/05/20 1916)  iohexol (OMNIPAQUE) 300 MG/ML solution 100 mL (100 mLs Intravenous Contrast Given 07/05/20 1817)  diazepam (VALIUM) tablet 1 mg (1 mg Oral Given 07/05/20 1828)  prochlorperazine  (COMPAZINE) injection 10 mg (10 mg Intravenous Given 07/05/20 2104)  acetaminophen (TYLENOL) tablet 1,000 mg (1,000 mg Oral Given 07/05/20 2103)     ED Discharge Orders         Ordered    sulfamethoxazole-trimethoprim (BACTRIM DS) 800-160 MG tablet  2 times daily        07/05/20 2318           Note:  This document was prepared using Dragon voice recognition software and may include unintentional dictation errors.   Lucrezia Starch, MD 07/05/20 [phone]

## 2020-07-05 NOTE — ED Notes (Signed)
Patient transported to MRI with technician on stretcher.

## 2020-07-05 NOTE — ED Notes (Signed)
Returned from CT.

## 2020-07-05 NOTE — Discharge Instructions (Addendum)
   Your CT Abdomen Pelvis Showed: IMPRESSION:  1. No CT evidence for acute intra-abdominal or pelvic abnormality.  2. Diffusely enlarged left adrenal gland with 4.4 cm indeterminate  left adrenal mass. Consider correlation with nonemergent adrenal CT.  3. 3.8 cm infrarenal abdominal aortic aneurysm. Recommend follow-up  ultrasound every 2 years. This recommendation follows ACR consensus  guidelines: White Paper of the ACR Incidental Findings Committee II  on Vascular Findings. J Am Coll Radiol 2013; 10:789-794.  4. 9 mm nodular density in the left lung base, appears to be  associated with pulmonary vessels and may represent small vascular  malformation.  5. 15 mm indeterminate hypodense lesion in the mid to upper right  kidney. When the patient is clinically stable and able to follow  directions and hold their breath (preferably as an outpatient)  further evaluation with dedicated abdominal MRI should be  considered.  6. Punctate focus of air in the urinary bladder, correlate for  recent instrumentation.  7. Asymmetrically small left kidney suggesting atrophy.  8. Aortic atherosclerosis.

## 2020-07-05 NOTE — ED Notes (Signed)
Patient to MRI.

## 2020-07-05 NOTE — ED Triage Notes (Addendum)
Patient arrived by EMS from home with dizziness,N/V, and headache. Also c/o left side/abdominal pain. Reports HX of vertigo but this is worse. Took meclizine this AM with no relief. EMS gave 4mg  zofran. HX diabetes. A&O x4 during triage

## 2020-07-06 NOTE — ED Notes (Signed)
Signature pad in room not working upon patient discharge. Pt verbalized understanding of all discharge and follow up teaching.

## 2020-07-07 LAB — URINE CULTURE: Culture: <10000 — AB

## 2020-07-10 DIAGNOSIS — R911 Solitary pulmonary nodule: Secondary | ICD-10-CM | POA: Diagnosis not present

## 2020-07-10 DIAGNOSIS — E279 Disorder of adrenal gland, unspecified: Secondary | ICD-10-CM | POA: Diagnosis not present

## 2020-07-10 DIAGNOSIS — L304 Erythema intertrigo: Secondary | ICD-10-CM | POA: Diagnosis not present

## 2020-07-10 DIAGNOSIS — R42 Dizziness and giddiness: Secondary | ICD-10-CM | POA: Diagnosis not present

## 2020-07-10 DIAGNOSIS — N289 Disorder of kidney and ureter, unspecified: Secondary | ICD-10-CM | POA: Diagnosis not present

## 2020-07-10 DIAGNOSIS — I714 Abdominal aortic aneurysm, without rupture: Secondary | ICD-10-CM | POA: Diagnosis not present

## 2020-07-10 DIAGNOSIS — Z09 Encounter for follow-up examination after completed treatment for conditions other than malignant neoplasm: Secondary | ICD-10-CM | POA: Diagnosis not present

## 2020-07-10 DIAGNOSIS — N2889 Other specified disorders of kidney and ureter: Secondary | ICD-10-CM | POA: Diagnosis not present

## 2020-07-12 DIAGNOSIS — I714 Abdominal aortic aneurysm, without rupture, unspecified: Secondary | ICD-10-CM | POA: Insufficient documentation

## 2020-07-12 DIAGNOSIS — I700 Atherosclerosis of aorta: Secondary | ICD-10-CM | POA: Insufficient documentation

## 2020-07-12 DIAGNOSIS — N289 Disorder of kidney and ureter, unspecified: Secondary | ICD-10-CM | POA: Insufficient documentation

## 2020-07-12 DIAGNOSIS — R911 Solitary pulmonary nodule: Secondary | ICD-10-CM | POA: Insufficient documentation

## 2020-07-19 ENCOUNTER — Ambulatory Visit: Payer: Self-pay | Admitting: General Surgery

## 2020-07-27 DIAGNOSIS — J321 Chronic frontal sinusitis: Secondary | ICD-10-CM | POA: Diagnosis not present

## 2020-07-27 DIAGNOSIS — J301 Allergic rhinitis due to pollen: Secondary | ICD-10-CM | POA: Diagnosis not present

## 2020-07-27 DIAGNOSIS — R42 Dizziness and giddiness: Secondary | ICD-10-CM | POA: Diagnosis not present

## 2020-08-06 ENCOUNTER — Other Ambulatory Visit (HOSPITAL_COMMUNITY): Payer: Self-pay | Admitting: Gerontology

## 2020-08-06 ENCOUNTER — Other Ambulatory Visit: Payer: Self-pay | Admitting: Gerontology

## 2020-08-06 DIAGNOSIS — E279 Disorder of adrenal gland, unspecified: Secondary | ICD-10-CM

## 2020-08-06 DIAGNOSIS — R911 Solitary pulmonary nodule: Secondary | ICD-10-CM

## 2020-08-06 DIAGNOSIS — L304 Erythema intertrigo: Secondary | ICD-10-CM

## 2020-08-06 DIAGNOSIS — N2889 Other specified disorders of kidney and ureter: Secondary | ICD-10-CM

## 2020-08-06 DIAGNOSIS — I714 Abdominal aortic aneurysm, without rupture, unspecified: Secondary | ICD-10-CM

## 2020-08-06 DIAGNOSIS — N289 Disorder of kidney and ureter, unspecified: Secondary | ICD-10-CM

## 2020-08-08 ENCOUNTER — Ambulatory Visit: Payer: PPO

## 2020-08-15 ENCOUNTER — Ambulatory Visit: Payer: PPO | Attending: Otolaryngology

## 2020-08-15 ENCOUNTER — Other Ambulatory Visit: Payer: Self-pay

## 2020-08-15 ENCOUNTER — Ambulatory Visit: Payer: PPO

## 2020-08-15 DIAGNOSIS — R42 Dizziness and giddiness: Secondary | ICD-10-CM | POA: Diagnosis not present

## 2020-08-15 NOTE — Therapy (Signed)
[facility] [address] [place], [place], [address] Phone: [phone]   Fax:  [phone]  Physical Therapy Evaluation  Patient Details  Name: Diane Anderson MRN: [id number] Date of Birth: 1946/09/08 Referring Provider (PT): Dr. Elenore Rota   Encounter Date: 08/15/2020   PT End of Session - 08/16/20 0917    Visit Number 1    Number of Visits 9    Date for PT Re-Evaluation 10/11/20    Authorization Type eval: 08/15/20    PT Start Time 1600    PT Stop Time 1645    PT Time Calculation (min) 45 min    Activity Tolerance Patient tolerated treatment well    Behavior During Therapy [facility] for tasks assessed/performed           Past Medical History:  Diagnosis Date  . Allergy    Seasonal   . Anxiety   . Arthritis    left knee, left shoulder  . Asthma   . Depression   . Diabetes mellitus without complication (HCC)    diet controlled after weight loss  . Discharge from left nipple 01/28/2017  . Fibrocystic breast disease   . GERD (gastroesophageal reflux disease)   . Hx of migraines   . Hypercholesteremia   . Hyperlipidemia   . Vertigo    avg 1x/mo  . Vitamin D deficiency   . Wears dentures    full upper    Past Surgical History:  Procedure Laterality Date  . BREAST BIOPSY Right 2011   stereo bx benign  . BREAST BIOPSY Left 04/18/2016   x3. 3:00 2 cmfn benign hyalinizing stroma cyst formation, 3:00 5cmfn benign ductal ectasia, 10:00 intraductal papilloma.  Marland Kitchen BREAST EXCISIONAL BIOPSY Right yrs ago    benign  . BREAST EXCISIONAL BIOPSY Left 05/15/2016   excision to remove papilloma at 10:00 location  . CATARACT EXTRACTION W/PHACO Left 08/10/2018   Procedure: CATARACT EXTRACTION PHACO AND INTRAOCULAR LENS PLACEMENT (IOC)  LEFT DIABETIC;  Surgeon: Lockie Mola, MD;  Location: [facility];  Service: Ophthalmology;  Laterality: Left;  Diabetic - diet controlled  . CATARACT EXTRACTION W/PHACO Right  08/31/2018   Procedure: CATARACT EXTRACTION PHACO AND INTRAOCULAR LENS PLACEMENT (IOC)  RIGHT DIABETIC;  Surgeon: Lockie Mola, MD;  Location: [facility];  Service: Ophthalmology;  Laterality: Right;  Diabetic - diet controlled  . CYST EXCISION     Dr. Michela Pitcher  . ETHMOIDECTOMY Right 08/04/2019   Procedure: ETHMOIDECTOMY WITH FRONTAL SINUOTOMY;  Surgeon: Vernie Murders, MD;  Location: [facility];  Service: ENT;  Laterality: Right;  Diabetic - diet controlled  . EXCISION OF BREAST BIOPSY Left 05/15/2016   Procedure: EXCISION OF BREAST BIOPSY;  Surgeon: Gladis Riffle, MD;  Location: [facility];  Service: General;  Laterality: Left;  . IMAGE GUIDED SINUS SURGERY Right 08/04/2019   Procedure: IMAGE GUIDED SINUS SURGERY;  Surgeon: Vernie Murders, MD;  Location: [facility];  Service: ENT;  Laterality: Right;  disk on OR charge nurse desk 11-16 kp  . LIPOMA EXCISION  1995   Right Shoulder  . LYMPH GLAND EXCISION Right 2011  . NASAL SEPTOPLASTY W/ TURBINOPLASTY Bilateral 08/04/2019   Procedure: NASAL SEPTOPLASTY WITH TURBINATE REDUCTION;  Surgeon: Vernie Murders, MD;  Location: [facility];  Service: ENT;  Laterality: Bilateral;  . TONSILLECTOMY    . TUBAL LIGATION      There were no vitals filed for this visit.    Subjective Assessment - 08/15/20 1625  Subjective Dizziness    Pertinent History Pt reports that she has been having "vertigo" for two years. Onset was sudden and occurred on Christmas Day 2 years ago. She went to [facility] and she was told that she had a "really bad sinus infection." She reports that she was referred to ENT to have her eyes examined and hearing tested. She saw [facility] and he did sinus surgery and drained fluid from her sinuses. Dizziness improved but would still occur intermittently. Sister reports that for the last 2 years she is "in the bed more than she is up." She takes Meclizine to help with the symptoms but only takes about 1/2  pill because a full pill will make her tired. She has seen ENT but reports that they have not found the cause of her dizziness. She does not remember having a VNG study however ENT notes states that she has had a VNG which was unrevealing. She states that when the episodes occur she gets a sense of heat that spreads throughout her body as well as nausea. She complains of concurrent chest pain and heart palpitations during these episodes. Denies any focal sensory or strength deficits during episodes. Denies visual changes. She has a history of frequent migraines but they stopped after her second daughter was born. Pt denies any cardiac history but reports that she told her PCP about the chest pain and they ordered a chest MRI which is upcoming. She states that she has not seen cardiology or neurology. Denies history of CVA. Past medical history includes HTN, HPL, DM, depression, asthma, anxiety, and arthritis. She recently went to [facility] ED for the vertigo on 07/05/20.  She was complaining of vertigo, headache, and nausea with nonbloody nonbilious vomiting. At the time she had a normal EKG with nonelevated troponin..  Chest x-ray with no evidence of pneumonia.  CT abdomen pelvis obtained remarkable for no acute findings but CT did show multiple incidental findings including adrenal mass, abdominal aortic aneurysm, significant atherosclerosis, and likely vascular lesion of the left lower lung. MRI brain showed no evidence of CVA but generalized age-related cerebral atrophy with moderate to advanced chronic small vessel ischemic disease. Chronic left ICA occlusion.    Diagnostic tests See history    Patient Stated Goals Decrease vertigo    Currently in Pain? No/denies               VESTIBULAR AND BALANCE EVALUATION   HISTORY:  Subjective history of current problem: Pt reports that she has been having "vertigo" for two years. Onset was sudden and occurred on Christmas Day 2 years ago. She went to [facility] and  she was told that she had a "really bad sinus infection." She reports that she was referred to ENT to have her eyes examined and hearing tested. She saw [facility] and he did sinus surgery and drained fluid from her sinuses. Dizziness improved but would still occur intermittently. Sister reports that for the last 2 years she is "in the bed more than she is up." She takes Meclizine to help with the symptoms but only takes about 1/2 pill because a full pill will make her tired. She has seen ENT but reports that they have not found the cause of her dizziness. She does not remember having a VNG study however ENT notes states that she has had a VNG which was unrevealing. She states that when the episodes occur she gets a sense of heat that spreads throughout her body as well as  nausea. She complains of concurrent chest pain and heart palpitations during these episodes. Denies any focal sensory or strength deficits during episodes. Denies visual changes. She has a history of frequent migraines but they stopped after her second daughter was born. Pt denies any cardiac history but reports that she told her PCP about the chest pain and they ordered a chest MRI which is upcoming. She states that she has not seen cardiology or neurology. Denies history of CVA. Past medical history includes HTN, HPL, DM, depression, asthma, anxiety, and arthritis. She recently went to [facility] ED for the vertigo on 07/05/20.  She was complaining of vertigo, headache, and nausea with nonbloody nonbilious vomiting. At the time she had a normal EKG with nonelevated troponin..  Chest x-ray with no evidence of pneumonia.  CT abdomen pelvis obtained remarkable for no acute findings but CT did show multiple incidental findings including adrenal mass, abdominal aortic aneurysm, significant atherosclerosis, and likely vascular lesion of the left lower lung. MRI brain showed no evidence of CVA but generalized age-related cerebral atrophy with moderate to  advanced chronic small vessel ischemic disease. Chronic left ICA occlusion.  Description of dizziness: (vertigo, unsteadiness, lightheadedness, falling, general unsteadiness, whoozy, swimmy-headed sensation, aural fullness) vertigo, swimmy-headed Frequency: every few days Duration: hours/days Symptom nature: (motion provoked, positional, spontaneous, constant, variable, intermittent) occasionally motion-provoked, sometimes spontaneous  Provocative Factors: Unknown Easing Factors: Meclizine helps but otherwise unknown  Progression of symptoms: (better, worse, no change since onset) unchanged History of similar episodes: Yes, pt has had symptoms for years  Falls (yes/no): Yes Number of falls in past 6 months: 1   Prior Functional Level: Independent with ADLs/IADLs, 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, not currently driving due to dizziness  Auditory complaints (tinnitus, pain, drainage, hearing loss, aural fullness): tinnitus for 2 years, hearing loss in the L ear, denies pain/drainage/fullness; Vision (diplopia, visual field loss, recent changes, last eye exam): none Headaches: History of frequent migraines before her second child but none since, infrequent headaches now Chest pain: chest pain during episode with "skipping heart beats," Anxiety: Pt reports some work related anxiety  Red Flags: (dysarthria, dysphagia, drop attacks, bowel and bladder changes, recent weight loss/gain) Review of systems negative for red flags.    EXAMINATION  POSTURE:   NEUROLOGICAL SCREEN: (2+ unless otherwise noted.) N=normal  Ab=abnormal  Level Dermatome R L Myotome R L Reflex R L  C3 Anterior Neck N N Sidebend C2-3 N N Jaw CN V    C4 Top of Shoulder N N Shoulder Shrug C4 N N Hoffman's UMN    C5 Lateral Upper Arm N N Shoulder ABD C4-5 N N Biceps C5-6    C6 Lateral Arm/ Thumb N N Arm Flex/ Wrist Ext C5-6 N N Brachiorad. C5-6    C7 Middle Finger N N Arm Ext//Wrist Flex C6-7 N N Triceps C7    C8 4th & 5th Finger N N  Flex/ Ext Carpi Ulnaris C8 N N Patellar (L3-4)    T1 Medial Arm N N Interossei T1 N N Gastrocnemius    L2 Medial thigh/groin N N Illiopsoas (L2-3) N N     L3 Lower thigh/med.knee N N Quadriceps (L3-4) N N     L4 Medial leg/lat thigh N N Tibialis Ant (L4-5) N N     L5 Lat. leg & dorsal foot N N EHL (L5) N N     S1 post/lat foot/thigh/leg N N Gastrocnemius (S1-2) N N     S2 Post./med. thigh & leg N N Hamstrings (  L4-S3) N N       Cranial Nerves Visual acuity and visual fields are intact  Extraocular muscles are intact  Facial sensation is intact bilaterally  Facial strength is intact bilaterally  Hearing is normal as tested by gross conversation Palate elevates midline, normal phonation  Shoulder shrug strength is intact  Tongue protrudes midline   COORDINATION: Finger to Nose: Normal Pronator Drift: Positive RUE Rapid Alternating Movements: Normal Finger to Thumb Opposition: Normal  MUSCULOSKELETAL SCREEN: Cervical Spine ROM: Mildly limited but painless in all planes. No gross deficits identified   ROM:WNL   MMT: WNL  Functional Mobility: Independent for transfers and ambulation without assistive device    POSTURAL CONTROL TESTS:   Clinical Test of Sensory Interaction for Balance    (CTSIB): Deferred   OCULOMOTOR / VESTIBULAR TESTING:  Oculomotor Exam- Room Light  Findings Comments  Ocular Alignment normal   Ocular ROM normal   Spontaneous Nystagmus normal   Gaze-Holding Nystagmus normal   End-Gaze Nystagmus normal   Vergence (normal 2-3") not examined   Smooth Pursuit abnormal Saccadic and provokes dizziness  Cross-Cover Test not examined   Saccades abnormal Multiple corrections required, provokes dizziness  VOR Cancellation abnormal No saccades noted but pt reports significant dizziness  Left Head Impulse normal   Right Head Impulse normal   Static Acuity not examined   Dynamic Acuity not examined     Oculomotor Exam- Fixation Suppressed:  Deferred   BPPV TESTS:  Symptoms Duration Intensity Nystagmus  L Dix-Hallpike None   None  R Dix-Hallpike None   None  L Head Roll None   None  R Head Roll None   None  L Sidelying Test      R Sidelying Test        FUNCTIONAL OUTCOME MEASURES:    Results Comments  BERG Deferred   DGI Deferred   TUG Deferred   5TSTS Deferred   6 Minute Walk Test Deferred   10 Meter Gait Speed Deferred   ABC Scale Pt forgot to complete prior to leaving   DHI Pt forgot to complete prior to leaving                     [facility] PT Assessment - 08/16/20 0914      Assessment   Medical Diagnosis Dizziness    Referring Provider (PT) Dr. Elenore Rota    Onset Date/Surgical Date 07/05/20    Hand Dominance Right    Next MD Visit Not reported    Prior Therapy Pt thinks she had vestibular therapy but is unsure. No record in [facility] of prior vestibular therapy      Precautions   Precautions Fall      Restrictions   Weight Bearing Restrictions No      Balance Screen   Has the patient fallen in the past 6 months Yes    How many times? 1    Has the patient had a decrease in activity level because of a fear of falling?  Yes    Is the patient reluctant to leave their home because of a fear of falling?  No      Home Tourist information centre manager residence      Prior Function   Level of Independence Independent      Cognition   Overall Cognitive Status Within Functional Limits for tasks assessed  Objective measurements completed on examination: See above findings.               PT Education - 08/16/20 0917    Education Details Plan of care    Person(s) Educated Patient    Methods Explanation    Comprehension Verbalized understanding            PT Short Term Goals - 08/16/20 1002      PT SHORT TERM GOAL #1   Title Pt will be independent with HEP in order to improve dizziness and balance in order to decrease fall risk and  improve function at home.    Time 4    Period Weeks    Status New    Target Date 09/13/20             PT Long Term Goals - 08/16/20 1005      PT LONG TERM GOAL #1   Title Pt will decrease DHI score by at least 18 points in order to demonstrate clinically significant reduction in disability    Baseline 08/15/20: Pt left without completing    Time 8    Period Weeks    Status New    Target Date 10/10/20      PT LONG TERM GOAL #2   Title Pt will improve ABC by at least 13% in order to demonstrate clinically significant improvement in balance confidence.    Baseline 08/15/20: Pt left without completing    Time 8    Period Weeks    Status New    Target Date 10/10/20      PT LONG TERM GOAL #3   Title Pt will improve BERG by at least 3 points in order to demonstrate clinically significant improvement in balance.    Baseline 08/15/20: Deferred to next session    Time 8    Period Weeks    Status New    Target Date 10/10/20      PT LONG TERM GOAL #4   Title Pt will reach FOTO target goal in order to demonstrate significant improvement in function    Baseline 08/15/20: Pt left without completing    Time 8    Period Weeks    Status New    Target Date 10/10/20                  Plan - 08/16/20 0917    Clinical Impression Statement Pt is a pleasant 74 year-old female referred for dizziness. PT examination reveals central signs with provocation of dizziness during smooth pursuit, saccade, and VOR cancellation testing. Negative head impulse and BPPV testing bilaterally. Pt reports symptoms that last for hours and days at a time. Does not appear to be peripheral and medical record indicates negative VNG in the past. Pt also complaining of concurrent chest pain and palpitations during episodes and PCP is working up to rule out cardiac issues. She also reports significant stress recently related to her health and work-related responsibilities. Pt has a history of frequent migraines  prior to the birth of her second child but none recently. If her cardiac work-up is negative she might benefit from consult with neurology to consider vestibular migraines as a possible explanation for her symptoms. Will perform fixation suppression testing with infrared goggles when pt returns for follow-up as well as additional balance testing. Pt will benefit from skilled PT services to address deficits in dizziness and improve symptom-free function at home.    Personal Factors and Comorbidities Age;Comorbidity 3+;Past/Current  Experience;Time since onset of injury/illness/exacerbation    Comorbidities Anxiety, depression, DM, chronic vertigo    Examination-Activity Limitations [place];Caring for Others    Examination-Participation Restrictions Community Activity;Driving;Shop    Stability/Clinical Decision Making Unstable/Unpredictable    Clinical Decision Making High    Rehab Potential Fair    PT Frequency 1x / week    PT Duration 8 weeks    PT Treatment/Interventions ADLs/Self Care Home Management;Aquatic Therapy;Biofeedback;Canalith Repostioning;Cryotherapy;Electrical Stimulation;Iontophoresis 4mg /ml Dexamethasone;Moist Heat;Traction;Ultrasound;DME Instruction;Gait training;Stair training;Functional mobility training;Therapeutic activities;Therapeutic exercise;Balance training;Neuromuscular re-education;Cognitive remediation;Patient/family education;Manual techniques;Dry needling;Vestibular;Spinal Manipulations;Joint Manipulations    PT Next Visit Plan Have pt complete DHI, ABC, and FOTO (update goals), fixation suppression testing, initiate HEP (VOR x 1 horizontal)    PT Home Exercise Plan None currently, to be initiated at next session    Consulted and Agree with Plan of Care Patient           Patient will benefit from skilled therapeutic intervention in order to improve the following deficits and impairments:  Dizziness  Visit Diagnosis: Dizziness and giddiness     Problem  List Patient Active Problem List   Diagnosis Date Noted  . Vomiting 04/26/2019  . Chronic right shoulder pain 02/10/2019  . Localized osteoarthritis of hands, bilateral 02/10/2019  . Chronic midline low back pain with sciatica 12/27/2018  . Hypercholesteremia   . Allergy   . Hx of migraines   . Vitamin D deficiency   . Acid reflux 03/13/2016  . Pure hypercholesterolemia 03/13/2016  . Type 2 diabetes mellitus (HCC) 08/21/2015  . Recurrent major depressive disorder, in partial remission (HCC) 08/21/2015  . Clinical depression 05/30/2014  . Anxiety 01/19/2014  . Avitaminosis D 01/19/2014   Lynnea Maizes PT, DPT, GCS  Darian Ace 08/16/2020, 10:17 AM  [place] [facility] [address] [place], [place], [address] Phone: [phone]   Fax:  [phone]  Name: Diane Anderson MRN: [id number] Date of Birth: [birth year]

## 2020-08-19 ENCOUNTER — Ambulatory Visit: Admission: RE | Admit: 2020-08-19 | Payer: PPO | Source: Ambulatory Visit

## 2020-08-21 ENCOUNTER — Other Ambulatory Visit: Payer: Self-pay | Admitting: Family Medicine

## 2020-08-21 DIAGNOSIS — R911 Solitary pulmonary nodule: Secondary | ICD-10-CM

## 2020-08-21 DIAGNOSIS — L304 Erythema intertrigo: Secondary | ICD-10-CM

## 2020-08-22 ENCOUNTER — Ambulatory Visit: Payer: PPO

## 2020-08-22 ENCOUNTER — Other Ambulatory Visit: Payer: Self-pay

## 2020-08-22 ENCOUNTER — Ambulatory Visit
Admission: RE | Admit: 2020-08-22 | Discharge: 2020-08-22 | Disposition: A | Discharge status: Home / Self Care | Payer: PPO | Source: Ambulatory Visit | Attending: Gerontology | Admitting: Gerontology

## 2020-08-22 DIAGNOSIS — D3500 Benign neoplasm of unspecified adrenal gland: Secondary | ICD-10-CM | POA: Diagnosis not present

## 2020-08-22 DIAGNOSIS — E279 Disorder of adrenal gland, unspecified: Secondary | ICD-10-CM | POA: Diagnosis not present

## 2020-08-22 DIAGNOSIS — I714 Abdominal aortic aneurysm, without rupture, unspecified: Secondary | ICD-10-CM

## 2020-08-22 DIAGNOSIS — N289 Disorder of kidney and ureter, unspecified: Secondary | ICD-10-CM | POA: Diagnosis not present

## 2020-08-22 DIAGNOSIS — N2889 Other specified disorders of kidney and ureter: Secondary | ICD-10-CM | POA: Insufficient documentation

## 2020-08-22 DIAGNOSIS — L304 Erythema intertrigo: Secondary | ICD-10-CM | POA: Diagnosis not present

## 2020-08-22 DIAGNOSIS — R42 Dizziness and giddiness: Secondary | ICD-10-CM

## 2020-08-22 DIAGNOSIS — N281 Cyst of kidney, acquired: Secondary | ICD-10-CM | POA: Diagnosis not present

## 2020-08-22 MED ORDER — GADOBUTROL 1 MMOL/ML IV SOLN
6.0000 mL | Freq: Once | INTRAVENOUS | Status: AC | PRN
  Administered 2020-08-22: 6 mL via INTRAVENOUS

## 2020-08-22 NOTE — Therapy (Addendum)
beating nystagmus that does not fatigue.  It is also visible with right gaze however  difficult to visualize with left gaze due to left endrange nystagmus.  This is consistent with a possible hypofunction, presumably on the left side.  Initiated VOR x1 horizontal exercise with patient and she reports onset of dizziness with inability to complete for longer than 30 seconds.  Issued for HEP.  Patient encouraged to follow-up as scheduled. Pt will benefit from PT services to address deficits in dizziness and balance in order to return to full function at home.    Personal Factors and Comorbidities Age;Comorbidity 3+;Past/Current Experience;Time since onset of injury/illness/exacerbation    Comorbidities Anxiety, depression, DM, chronic vertigo    Examination-Activity Limitations [place];Caring for Others    Examination-Participation Restrictions Community Activity;Driving;Shop    Stability/Clinical Decision Making Unstable/Unpredictable    Rehab Potential Fair    PT Frequency 1x / week    PT Duration 8 weeks    PT Treatment/Interventions ADLs/Self Care Home Management;Aquatic Therapy;Biofeedback;Canalith Repostioning;Cryotherapy;Electrical Stimulation;Iontophoresis 4mg /ml Dexamethasone;Moist Heat;Traction;Ultrasound;DME Instruction;Gait training;Stair training;Functional mobility training;Therapeutic activities;Therapeutic exercise;Balance training;Neuromuscular re-education;Cognitive remediation;Patient/family education;Manual techniques;Dry needling;Vestibular;Spinal Manipulations;Joint Manipulations    PT Next Visit Plan Review HEP, progress habituation and adaptation exercises   PT Home Exercise Plan None currently, to be initiated at next session    Consulted and Agree with Plan of Care Patient           Patient will benefit from skilled therapeutic intervention in order to improve the following deficits and impairments:  Dizziness  Visit Diagnosis: Dizziness and giddiness     Problem List Patient Active Problem List   Diagnosis Date Noted  . Vomiting 04/26/2019  . Chronic  right shoulder pain 02/10/2019  . Localized osteoarthritis of hands, bilateral 02/10/2019  . Chronic midline low back pain with sciatica 12/27/2018  . Hypercholesteremia   . Allergy   . Hx of migraines   . Vitamin D deficiency   . Acid reflux 03/13/2016  . Pure hypercholesterolemia 03/13/2016  . Type 2 diabetes mellitus ([place]) 08/21/2015  . Recurrent major depressive disorder, in partial remission ([place]) 08/21/2015  . Clinical depression 05/30/2014  . Anxiety 01/19/2014  . Avitaminosis D 01/19/2014   Phillips Grout PT, DPT, GCS  Dade Rodin 08/23/2020, 10:37 AM  [place] [facility] [address] [place], [place], [address] Phone: [phone]   Fax:  [phone]  Name: TOYA PALACIOS MRN: [id number] Date of Birth: 06-01-1946  [place] [facility] [address] [place], [place], [address] Phone: [phone]   Fax:  [phone]  Physical Therapy Treatment  Patient Details  Name: SARH KIRSCHENBAUM MRN: [id number] Date of Birth: July 03, 1946 Referring Provider (PT): Dr. Kathyrn Sheriff   Encounter Date: 08/22/2020   PT End of Session - 08/22/20 1615    Visit Number 2    Number of Visits 9    Date for PT Re-Evaluation 10/11/20    Authorization Type eval: 08/15/20    PT Start Time 1610    PT Stop Time 1645    PT Time Calculation (min) 35 min    Activity Tolerance Patient tolerated treatment well    Behavior During Therapy [employer] for tasks assessed/performed           Past Medical History:  Diagnosis Date  . Allergy    Seasonal   . Anxiety   . Arthritis    left knee, left shoulder  . Asthma   . Depression   . Diabetes mellitus without complication ([place])    diet controlled after weight loss  . Discharge from left nipple 01/28/2017  . Fibrocystic breast disease   . GERD (gastroesophageal reflux disease)   . Hx of migraines   . Hypercholesteremia   . Hyperlipidemia   . Vertigo    avg 1x/mo  . Vitamin D deficiency   . Wears dentures    full upper    Past Surgical History:  Procedure Laterality Date  . BREAST BIOPSY Right 2011   stereo bx benign  . BREAST BIOPSY Left 04/18/2016   x3. 3:00 2 cmfn benign hyalinizing stroma cyst formation, 3:00 5cmfn benign ductal ectasia, 10:00 intraductal papilloma.  Marland Kitchen BREAST EXCISIONAL BIOPSY Right yrs ago    benign  . BREAST EXCISIONAL BIOPSY Left 05/15/2016   excision to remove papilloma at 10:00 location  . CATARACT EXTRACTION W/PHACO Left 08/10/2018   Procedure: CATARACT EXTRACTION PHACO AND INTRAOCULAR LENS PLACEMENT ([place])  LEFT DIABETIC;  Surgeon: Leandrew Koyanagi, MD;  Location: [facility];  Service: Ophthalmology;  Laterality: Left;  Diabetic - diet controlled  . CATARACT EXTRACTION W/PHACO Right  08/31/2018   Procedure: CATARACT EXTRACTION PHACO AND INTRAOCULAR LENS PLACEMENT ([place])  RIGHT DIABETIC;  Surgeon: Leandrew Koyanagi, MD;  Location: [place];  Service: Ophthalmology;  Laterality: Right;  Diabetic - diet controlled  . CYST EXCISION     Dr. Pat Patrick  . ETHMOIDECTOMY Right 08/04/2019   Procedure: ETHMOIDECTOMY WITH FRONTAL SINUOTOMY;  Surgeon: Margaretha Sheffield, MD;  Location: [place];  Service: ENT;  Laterality: Right;  Diabetic - diet controlled  . EXCISION OF BREAST BIOPSY Left 05/15/2016   Procedure: EXCISION OF BREAST BIOPSY;  Surgeon: Hubbard Robinson, MD;  Location: [facility];  Service: General;  Laterality: Left;  . IMAGE GUIDED SINUS SURGERY Right 08/04/2019   Procedure: IMAGE GUIDED SINUS SURGERY;  Surgeon: Margaretha Sheffield, MD;  Location: [place];  Service: ENT;  Laterality: Right;  disk on OR charge nurse desk 11-16 kp  . LIPOMA EXCISION  1995   Right Shoulder  . LYMPH GLAND EXCISION Right 2011  . NASAL SEPTOPLASTY W/ TURBINOPLASTY Bilateral 08/04/2019   Procedure: NASAL SEPTOPLASTY WITH TURBINATE REDUCTION;  Surgeon: Margaretha Sheffield, MD;  Location: [place];  Service: ENT;  Laterality: Bilateral;  . TONSILLECTOMY    . TUBAL LIGATION      There were no vitals filed for this visit.   Subjective Assessment - 08/22/20 1614  beating nystagmus that does not fatigue.  It is also visible with right gaze however  difficult to visualize with left gaze due to left endrange nystagmus.  This is consistent with a possible hypofunction, presumably on the left side.  Initiated VOR x1 horizontal exercise with patient and she reports onset of dizziness with inability to complete for longer than 30 seconds.  Issued for HEP.  Patient encouraged to follow-up as scheduled. Pt will benefit from PT services to address deficits in dizziness and balance in order to return to full function at home.    Personal Factors and Comorbidities Age;Comorbidity 3+;Past/Current Experience;Time since onset of injury/illness/exacerbation    Comorbidities Anxiety, depression, DM, chronic vertigo    Examination-Activity Limitations [place];Caring for Others    Examination-Participation Restrictions Community Activity;Driving;Shop    Stability/Clinical Decision Making Unstable/Unpredictable    Rehab Potential Fair    PT Frequency 1x / week    PT Duration 8 weeks    PT Treatment/Interventions ADLs/Self Care Home Management;Aquatic Therapy;Biofeedback;Canalith Repostioning;Cryotherapy;Electrical Stimulation;Iontophoresis 4mg /ml Dexamethasone;Moist Heat;Traction;Ultrasound;DME Instruction;Gait training;Stair training;Functional mobility training;Therapeutic activities;Therapeutic exercise;Balance training;Neuromuscular re-education;Cognitive remediation;Patient/family education;Manual techniques;Dry needling;Vestibular;Spinal Manipulations;Joint Manipulations    PT Next Visit Plan Review HEP, progress habituation and adaptation exercises   PT Home Exercise Plan None currently, to be initiated at next session    Consulted and Agree with Plan of Care Patient           Patient will benefit from skilled therapeutic intervention in order to improve the following deficits and impairments:  Dizziness  Visit Diagnosis: Dizziness and giddiness     Problem List Patient Active Problem List   Diagnosis Date Noted  . Vomiting 04/26/2019  . Chronic  right shoulder pain 02/10/2019  . Localized osteoarthritis of hands, bilateral 02/10/2019  . Chronic midline low back pain with sciatica 12/27/2018  . Hypercholesteremia   . Allergy   . Hx of migraines   . Vitamin D deficiency   . Acid reflux 03/13/2016  . Pure hypercholesterolemia 03/13/2016  . Type 2 diabetes mellitus ([place]) 08/21/2015  . Recurrent major depressive disorder, in partial remission ([place]) 08/21/2015  . Clinical depression 05/30/2014  . Anxiety 01/19/2014  . Avitaminosis D 01/19/2014   Phillips Grout PT, DPT, GCS  Dade Rodin 08/23/2020, 10:37 AM  [place] [facility] [address] [place], [place], [address] Phone: [phone]   Fax:  [phone]  Name: TOYA PALACIOS MRN: [id number] Date of Birth: 06-01-1946  [place] [facility] [address] [place], [place], [address] Phone: [phone]   Fax:  [phone]  Physical Therapy Treatment  Patient Details  Name: SARH KIRSCHENBAUM MRN: [id number] Date of Birth: July 03, 1946 Referring Provider (PT): Dr. Kathyrn Sheriff   Encounter Date: 08/22/2020   PT End of Session - 08/22/20 1615    Visit Number 2    Number of Visits 9    Date for PT Re-Evaluation 10/11/20    Authorization Type eval: 08/15/20    PT Start Time 1610    PT Stop Time 1645    PT Time Calculation (min) 35 min    Activity Tolerance Patient tolerated treatment well    Behavior During Therapy [employer] for tasks assessed/performed           Past Medical History:  Diagnosis Date  . Allergy    Seasonal   . Anxiety   . Arthritis    left knee, left shoulder  . Asthma   . Depression   . Diabetes mellitus without complication ([place])    diet controlled after weight loss  . Discharge from left nipple 01/28/2017  . Fibrocystic breast disease   . GERD (gastroesophageal reflux disease)   . Hx of migraines   . Hypercholesteremia   . Hyperlipidemia   . Vertigo    avg 1x/mo  . Vitamin D deficiency   . Wears dentures    full upper    Past Surgical History:  Procedure Laterality Date  . BREAST BIOPSY Right 2011   stereo bx benign  . BREAST BIOPSY Left 04/18/2016   x3. 3:00 2 cmfn benign hyalinizing stroma cyst formation, 3:00 5cmfn benign ductal ectasia, 10:00 intraductal papilloma.  Marland Kitchen BREAST EXCISIONAL BIOPSY Right yrs ago    benign  . BREAST EXCISIONAL BIOPSY Left 05/15/2016   excision to remove papilloma at 10:00 location  . CATARACT EXTRACTION W/PHACO Left 08/10/2018   Procedure: CATARACT EXTRACTION PHACO AND INTRAOCULAR LENS PLACEMENT ([place])  LEFT DIABETIC;  Surgeon: Leandrew Koyanagi, MD;  Location: [facility];  Service: Ophthalmology;  Laterality: Left;  Diabetic - diet controlled  . CATARACT EXTRACTION W/PHACO Right  08/31/2018   Procedure: CATARACT EXTRACTION PHACO AND INTRAOCULAR LENS PLACEMENT ([place])  RIGHT DIABETIC;  Surgeon: Leandrew Koyanagi, MD;  Location: [place];  Service: Ophthalmology;  Laterality: Right;  Diabetic - diet controlled  . CYST EXCISION     Dr. Pat Patrick  . ETHMOIDECTOMY Right 08/04/2019   Procedure: ETHMOIDECTOMY WITH FRONTAL SINUOTOMY;  Surgeon: Margaretha Sheffield, MD;  Location: [place];  Service: ENT;  Laterality: Right;  Diabetic - diet controlled  . EXCISION OF BREAST BIOPSY Left 05/15/2016   Procedure: EXCISION OF BREAST BIOPSY;  Surgeon: Hubbard Robinson, MD;  Location: [facility];  Service: General;  Laterality: Left;  . IMAGE GUIDED SINUS SURGERY Right 08/04/2019   Procedure: IMAGE GUIDED SINUS SURGERY;  Surgeon: Margaretha Sheffield, MD;  Location: [place];  Service: ENT;  Laterality: Right;  disk on OR charge nurse desk 11-16 kp  . LIPOMA EXCISION  1995   Right Shoulder  . LYMPH GLAND EXCISION Right 2011  . NASAL SEPTOPLASTY W/ TURBINOPLASTY Bilateral 08/04/2019   Procedure: NASAL SEPTOPLASTY WITH TURBINATE REDUCTION;  Surgeon: Margaretha Sheffield, MD;  Location: [place];  Service: ENT;  Laterality: Bilateral;  . TONSILLECTOMY    . TUBAL LIGATION      There were no vitals filed for this visit.   Subjective Assessment - 08/22/20 1614

## 2020-08-29 ENCOUNTER — Ambulatory Visit: Payer: PPO

## 2020-09-05 ENCOUNTER — Ambulatory Visit: Payer: PPO

## 2020-09-06 ENCOUNTER — Ambulatory Visit: Admission: RE | Admit: 2020-09-06 | Payer: PPO | Source: Ambulatory Visit

## 2020-09-12 ENCOUNTER — Ambulatory Visit: Payer: PPO

## 2020-09-25 ENCOUNTER — Ambulatory Visit: Payer: PPO

## 2020-09-25 NOTE — Patient Instructions (Incomplete)
Review HEP, progress habituation and adaptation exercises  Pt performed VOR x 1 horizontal 30s x 3, she reports significant increase in dizziness with each repetition and is unable to perform for more than 30s. Pt issued written HEP with education/instruction about how to perform correctly at home;   Patient reports feeling well since last therapy session.  No significant reports of dizziness with the exception of some mild symptoms one morning.  She arrived late for session so time was slightly abbreviated.  Initiated VOR x1 horizontal exercise with patient and she reports onset of dizziness with inability to complete for longer than 30 seconds.  Issued for HEP.  Patient encouraged to follow-up as scheduled. Pt will benefit from PT services to address deficits in dizziness and balance in order to return to full function at home.

## 2020-10-02 ENCOUNTER — Ambulatory Visit: Payer: PPO | Attending: Otolaryngology

## 2020-10-02 DIAGNOSIS — R42 Dizziness and giddiness: Secondary | ICD-10-CM | POA: Insufficient documentation

## 2020-10-02 NOTE — Patient Instructions (Incomplete)
TREATMENT   Neuromuscular Re-education  Warm-up on NuStep L0 x 5 minutes (unbilled); Pt completed FOTO (56, predicted improvement to 67), ABC (48.8%), and DHI (38/100); Peformed fixation suppression testing (see below) Pt performed VOR x 1 horizontal 30s x 3, she reports significant increase in dizziness with each repetition and is unable to perform for more than 30s. Pt issued written HEP with education/instruction about how to perform correctly at home;   Patient reports feeling well since initial evaluation.  No significant reports of dizziness with the exception of some mild symptoms one morning.  She arrived late for session so time was slightly abbreviated.  Patient completed FOTO, ABC, and [place] today and goals updated.  Performed fixation suppression testing with patient today and she presents with a spontaneous pure horizontal right beating nystagmus that does not fatigue.  It is also visible with right gaze however difficult to visualize with left gaze due to left endrange nystagmus.  This is consistent with a possible hypofunction, presumably on the left side.  Initiated VOR x1 horizontal exercise with patient and she reports onset of dizziness with inability to complete for longer than 30 seconds.  Issued for HEP.  Patient encouraged to follow-up as scheduled. Pt will benefit from PT services to address deficits in dizziness and balance in order to return to full function at home.

## 2020-10-05 ENCOUNTER — Encounter (INDEPENDENT_AMBULATORY_CARE_PROVIDER_SITE_OTHER): Payer: PPO | Admitting: Vascular Surgery

## 2020-10-09 ENCOUNTER — Ambulatory Visit: Payer: PPO

## 2020-11-06 ENCOUNTER — Encounter (INDEPENDENT_AMBULATORY_CARE_PROVIDER_SITE_OTHER): Payer: PPO | Admitting: Vascular Surgery

## 2020-11-06 ENCOUNTER — Ambulatory Visit (INDEPENDENT_AMBULATORY_CARE_PROVIDER_SITE_OTHER): Payer: PPO | Admitting: Vascular Surgery

## 2020-11-06 ENCOUNTER — Other Ambulatory Visit: Payer: Self-pay

## 2020-11-06 VITALS — BP 138/58 | HR 78 | Ht 63.0 in | Wt 165.0 lb

## 2020-11-06 DIAGNOSIS — I714 Abdominal aortic aneurysm, without rupture, unspecified: Secondary | ICD-10-CM

## 2020-11-06 DIAGNOSIS — Z716 Tobacco abuse counseling: Secondary | ICD-10-CM

## 2020-11-06 DIAGNOSIS — I700 Atherosclerosis of aorta: Secondary | ICD-10-CM | POA: Diagnosis not present

## 2020-11-06 DIAGNOSIS — E7800 Pure hypercholesterolemia, unspecified: Secondary | ICD-10-CM

## 2020-11-06 DIAGNOSIS — F17200 Nicotine dependence, unspecified, uncomplicated: Secondary | ICD-10-CM

## 2020-11-06 DIAGNOSIS — R8290 Unspecified abnormal findings in urine: Secondary | ICD-10-CM | POA: Diagnosis not present

## 2020-11-06 DIAGNOSIS — Z0000 Encounter for general adult medical examination without abnormal findings: Secondary | ICD-10-CM | POA: Diagnosis not present

## 2020-11-06 DIAGNOSIS — E119 Type 2 diabetes mellitus without complications: Secondary | ICD-10-CM | POA: Diagnosis not present

## 2020-11-06 DIAGNOSIS — J301 Allergic rhinitis due to pollen: Secondary | ICD-10-CM | POA: Diagnosis not present

## 2020-11-06 DIAGNOSIS — F3341 Major depressive disorder, recurrent, in partial remission: Secondary | ICD-10-CM | POA: Diagnosis not present

## 2020-11-06 DIAGNOSIS — K219 Gastro-esophageal reflux disease without esophagitis: Secondary | ICD-10-CM | POA: Diagnosis not present

## 2020-11-06 DIAGNOSIS — R42 Dizziness and giddiness: Secondary | ICD-10-CM | POA: Diagnosis not present

## 2020-11-06 DIAGNOSIS — E559 Vitamin D deficiency, unspecified: Secondary | ICD-10-CM | POA: Diagnosis not present

## 2020-11-06 DIAGNOSIS — R809 Proteinuria, unspecified: Secondary | ICD-10-CM | POA: Diagnosis not present

## 2020-11-06 DIAGNOSIS — E1129 Type 2 diabetes mellitus with other diabetic kidney complication: Secondary | ICD-10-CM | POA: Diagnosis not present

## 2020-11-06 NOTE — Assessment & Plan Note (Signed)
The patient has a recent MRI which demonstrates a 3.6 cm abdominal aortic aneurysm.  MRI is not particularly accurate for the measurement of aneurysms, and I would recommend a duplex to be done in the next several months at her convenience for better evaluation.  If this is indeed in the 3 to 4 mm range, it should be followed annually with duplex.  We have discussed the pathophysiology and natural history of aneurysms.  We have discussed the reason and rationale for monitoring these and the fact that we generally repair these at near 5 cm in diameter.  We discussed that smoking cessation would decrease her risk of aneurysm growth as well as controlling blood pressure.  The patient voices her understanding.

## 2020-11-06 NOTE — Assessment & Plan Note (Signed)
blood glucose control important in reducing the progression of atherosclerotic disease. Also, involved in wound healing. On appropriate medications.  

## 2020-11-06 NOTE — Assessment & Plan Note (Signed)
lipid control important in reducing the progression of atherosclerotic disease. Continue statin therapy  

## 2020-11-06 NOTE — Progress Notes (Signed)
Patient ID: Diane Anderson, female   DOB: 1946-04-07, 75 y.o.   MRN: [id number]  Chief Complaint  Patient presents with  . New Patient (Initial Visit)    [place] atherosclerosis  of the arteries mra done on Dec 2021    HPI Diane Anderson is a 75 y.o. female.  I am asked to see the patient by Dr. Ellison Hughs for evaluation of abnormality seen on an abdominal MRI performed recently.  There is significant atherosclerotic disease as well as aneurysmal dilatation of the abdominal aorta in the 3.6 cm range.  MRI is admittedly an accurate for assessing the diameter of abdominal aortic aneurysms but if anything it generally underestimates the diameter somewhat.  The patient has no previous knowledge of having an abdominal aortic aneurysm.  She denies any aneurysm related symptoms. Specifically, the patient denies new back or abdominal pain, or signs of peripheral embolization.  She has a long history of tobacco use which is ongoing.  She has been able to cut back some over the past several months, but not quit.  No family history of aneurysms to her knowledge   Past Medical History:  Diagnosis Date  . Allergy    Seasonal   . Anxiety   . Arthritis    left knee, left shoulder  . Asthma   . Depression   . Diabetes mellitus without complication ([place])    diet controlled after weight loss  . Discharge from left nipple 01/28/2017  . Fibrocystic breast disease   . GERD (gastroesophageal reflux disease)   . Hx of migraines   . Hypercholesteremia   . Hyperlipidemia   . Vertigo    avg 1x/mo  . Vitamin D deficiency   . Wears dentures    full upper    Past Surgical History:  Procedure Laterality Date  . BREAST BIOPSY Right 2011   stereo bx benign  . BREAST BIOPSY Left 04/18/2016   x3. 3:00 2 cmfn benign hyalinizing stroma cyst formation, 3:00 5cmfn benign ductal ectasia, 10:00 intraductal papilloma.  Marland Kitchen BREAST EXCISIONAL BIOPSY Right yrs ago    benign  . BREAST EXCISIONAL  BIOPSY Left 05/15/2016   excision to remove papilloma at 10:00 location  . CATARACT EXTRACTION W/PHACO Left 08/10/2018   Procedure: CATARACT EXTRACTION PHACO AND INTRAOCULAR LENS PLACEMENT ([place])  LEFT DIABETIC;  Surgeon: Leandrew Koyanagi, MD;  Location: [facility];  Service: Ophthalmology;  Laterality: Left;  Diabetic - diet controlled  . CATARACT EXTRACTION W/PHACO Right 08/31/2018   Procedure: CATARACT EXTRACTION PHACO AND INTRAOCULAR LENS PLACEMENT ([place])  RIGHT DIABETIC;  Surgeon: Leandrew Koyanagi, MD;  Location: [place];  Service: Ophthalmology;  Laterality: Right;  Diabetic - diet controlled  . CYST EXCISION     Dr. Pat Patrick  . ETHMOIDECTOMY Right 08/04/2019   Procedure: ETHMOIDECTOMY WITH FRONTAL SINUOTOMY;  Surgeon: Margaretha Sheffield, MD;  Location: [facility];  Service: ENT;  Laterality: Right;  Diabetic - diet controlled  . EXCISION OF BREAST BIOPSY Left 05/15/2016   Procedure: EXCISION OF BREAST BIOPSY;  Surgeon: Hubbard Robinson, MD;  Location: [facility];  Service: General;  Laterality: Left;  . IMAGE GUIDED SINUS SURGERY Right 08/04/2019   Procedure: IMAGE GUIDED SINUS SURGERY;  Surgeon: Margaretha Sheffield, MD;  Location: [place];  Service: ENT;  Laterality: Right;  disk on OR charge nurse desk 11-16 kp  . LIPOMA EXCISION  1995   Right Shoulder  . LYMPH GLAND EXCISION Right 2011  . NASAL SEPTOPLASTY W/ TURBINOPLASTY  Bilateral 08/04/2019   Procedure: NASAL SEPTOPLASTY WITH TURBINATE REDUCTION;  Surgeon: Margaretha Sheffield, MD;  Location: [place];  Service: ENT;  Laterality: Bilateral;  . TONSILLECTOMY    . TUBAL LIGATION       Family History  Problem Relation Age of Onset  . Dementia Mother   . Cancer Mother        skin  . Osteoporosis Mother   . Alzheimer's disease Father   . Asthma Father   . Hypertension Father   . Breast cancer Maternal Aunt 66  . Cancer Maternal Aunt        Breast Cancer  . Hypertension Maternal Aunt   .  Birth defects Cousin 51       Breast  . Breast cancer Cousin   . Breast cancer Cousin   . Hypertension Sister   . Diabetes Paternal Grandmother   . Breast cancer Maternal Grandfather      Social History   Tobacco Use  . Smoking status: Current Every Day Smoker    Packs/day: 1.00    Years: 52.00    Pack years: 52.00    Types: Cigarettes  . Smokeless tobacco: Never Used  Vaping Use  . Vaping Use: Never used  Substance Use Topics  . Alcohol use: No    Alcohol/week: 0.0 standard drinks    Comment: rarely  . Drug use: No    Allergies  Allergen Reactions  . Cyclobenzaprine Other (See Comments) and Rash    It made her vertigo worse.  Marland Kitchen Penicillins Rash  . Adhesive [Tape] Itching    "heavy" white tape  . Atorvastatin Other (See Comments)    Severe Sweating    Current Outpatient Medications  Medication Sig Dispense Refill  . ACETAMINOPHEN PO Take by mouth as needed.    . citalopram (CELEXA) 40 MG tablet Take 1 tablet by mouth daily.    . fluticasone (FLONASE) 50 MCG/ACT nasal spray Place 2 sprays into the nose daily as needed.     Marland Kitchen lisinopril (PRINIVIL,ZESTRIL) 2.5 MG tablet Take 1 tablet by mouth daily.    . meclizine (ANTIVERT) 25 MG tablet Take 25 mg by mouth 3 (three) times daily as needed.    . nystatin cream (MYCOSTATIN) Apply topically.    Marland Kitchen omeprazole (PRILOSEC) 20 MG capsule Take 1 capsule by mouth daily as needed.     . ondansetron (ZOFRAN ODT) 4 MG disintegrating tablet Take 1 tablet (4 mg total) by mouth every 6 (six) hours as needed for nausea or vomiting. 20 tablet 0  . predniSONE (DELTASONE) 10 MG tablet Start with 3 pills tomorrow. Taper over the next 6 days.  3,3,2,2,1,1. 12 tablet 0  . rosuvastatin (CRESTOR) 20 MG tablet Take 20 mg by mouth daily.  3  . Vitamin D, Ergocalciferol, (DRISDOL) 50000 units CAPS capsule Take 50,000 Units by mouth every 7 (seven) days.    . montelukast (SINGULAIR) 10 MG tablet Take 1 tablet by mouth at bedtime.     No current  facility-administered medications for this visit.      REVIEW OF SYSTEMS (Negative unless checked)  Constitutional: [] Weight loss  [] Fever  [] Chills Cardiac: [] Chest pain   [] Chest pressure   [] Palpitations   [] Shortness of breath when laying flat   [] Shortness of breath at rest   [] Shortness of breath with exertion. Vascular:  [] Pain in legs with walking   [] Pain in legs at rest   [] Pain in legs when laying flat   [] Claudication   [] Pain  in feet when walking  [] Pain in feet at rest  [] Pain in feet when laying flat   [] History of DVT   [] Phlebitis   [] Swelling in legs   [] Varicose veins   [] Non-healing ulcers Pulmonary:   [] Uses home oxygen   [] Productive cough   [] Hemoptysis   [] Wheeze  [] COPD   [] Asthma Neurologic:  [] Dizziness  [] Blackouts   [] Seizures   [] History of stroke   [] History of TIA  [] Aphasia   [] Temporary blindness   [] Dysphagia   [] Weakness or numbness in arms   [] Weakness or numbness in legs Musculoskeletal:  [x] Arthritis   [] Joint swelling   [x] Joint pain   [] Low back pain Hematologic:  [] Easy bruising  [] Easy bleeding   [] Hypercoagulable state   [] Anemic  [] Hepatitis Gastrointestinal:  [] Blood in stool   [] Vomiting blood  [x] Gastroesophageal reflux/heartburn   [] Abdominal pain Genitourinary:  [] Chronic kidney disease   [] Difficult urination  [] Frequent urination  [] Burning with urination   [] Hematuria Skin:  [] Rashes   [] Ulcers   [] Wounds Psychological:  [x] History of anxiety   [x]  History of major depression.    Physical Exam BP (!) 138/58   Pulse 78   Ht 5\' 3"  (1.6 m)   Wt 165 lb (74.8 kg)   BMI 29.23 kg/m  Gen:  WD/WN, NAD Head: [place]/AT, No temporalis wasting.  Ear/Nose/Throat: Hearing grossly intact, nares w/o erythema or drainage, oropharynx w/o Erythema/Exudate Eyes: Conjunctiva clear, sclera non-icteric  Neck: trachea midline.  No JVD.  Pulmonary:  Good air movement, respirations not labored, no use of accessory muscles  Cardiac: RRR, no JVD Vascular:   Vessel Right Left  Radial Palpable Palpable                Musculoskeletal: M/S 5/5 throughout.  Extremities without ischemic changes.  No deformity or atrophy. No LE edema. Neurologic: Sensation grossly intact in extremities.  Symmetrical.  Speech is fluent. Motor exam as listed above. Psychiatric: Judgment intact, Mood & affect appropriate for pt's clinical situation. Dermatologic: No rashes or ulcers noted.  No cellulitis or open wounds.    Radiology No results found.  Labs No results found for this or any previous visit (from the past 2160 hour(s)).  Assessment/Plan:  Aortic atherosclerosis (HCC) Common association with abdominal aortic aneurysms.  AAA (abdominal aortic aneurysm) without rupture [facility]) The patient has a recent MRI which demonstrates a 3.6 cm abdominal aortic aneurysm.  MRI is not particularly accurate for the measurement of aneurysms, and I would recommend a duplex to be done in the next several months at her convenience for better evaluation.  If this is indeed in the 3 to 4 mm range, it should be followed annually with duplex.  We have discussed the pathophysiology and natural history of aneurysms.  We have discussed the reason and rationale for monitoring these and the fact that we generally repair these at near 5 cm in diameter.  We discussed that smoking cessation would decrease her risk of aneurysm growth as well as controlling blood pressure.  The patient voices her understanding.  Type 2 diabetes mellitus (HCC) blood glucose control important in reducing the progression of atherosclerotic disease. Also, involved in wound healing. On appropriate medications.   Hypercholesteremia lipid control important in reducing the progression of atherosclerotic disease. Continue statin therapy   Tobacco use disorder We had a discussion for approximately 3-4 minutes regarding the absolute need for smoking cessation due to the deleterious nature of tobacco on the  vascular system. We discussed the tobacco  use would diminish patency of any intervention, and likely significantly worsen progressio of disease. We discussed multiple agents for quitting including replacement therapy or medications to reduce cravings such as Chantix. The patient voices their understanding of the importance of smoking cessation.       Leotis Pain 11/06/2020, 2:15 PM   This note was created with Dragon medical transcription system.  Any errors from dictation are unintentional.

## 2020-11-06 NOTE — Patient Instructions (Signed)
Abdominal Aortic Aneurysm  An aneurysm is a bulge in a blood vessel that carries blood away from the heart (artery). It happens when blood pushes against a weak or damaged place on the wall of the blood vessel. An abdominal aortic aneurysm happens in the main blood vessel that carries blood away from the heart (aorta). Most aneurysms do not cause problems, but some do cause problems. If an aneurysm grows, it can burst or tear. This causes bleeding inside you. It is an emergency. It can be life-threatening. What are the causes? The exact cause of this condition is not known. What increases the risk? The following factors may make you more likely to develop this condition:  Being female and 60 years of age or older.  Being of [place] descent.  Using nicotine or tobacco now or in the past.  Having a family history of aneurysms.  Having any of these problems: ? Hardening of blood vessels that carry blood away from the heart. ? Irritation and swelling of the walls of blood vessels that carry blood away from the heart. ? Certain genetic problems. ? Being very overweight. ? An infection in the wall of your aorta. ? High cholesterol. ? High blood pressure (hypertension). What are the signs or symptoms? Symptoms depend on the size of your aneurysm and how fast it is growing. Most aneurysms grow slowly and do not cause symptoms. If symptoms happen, you may:  Have very bad pain in your belly (abdomen), side, or low back.  Feel full after eating only a little food.  Feel a throbbing lump in your belly.  Have these problems with your feet or toes: ? Pain. ? Skin turning blue. ? Sores.  Have trouble pooping (constipation).  Have trouble peeing (urinating). If your aneurysm bursts, you may:  Feel sudden, very bad pain in the belly, side, or back.  Feel like you may vomit.  Vomit.  Feel light-headed.  Faint. How is this treated? Treatment for this condition depends  on:  The size of your aneurysm.  How fast it is growing.  Your age.  Your risk of having the aneurysm burst. If your aneurysm is smaller than 2 inches (5 cm), your doctor may:  Check it often to see if it is growing. You may have an imaging test (ultrasound) to check it every 3-6 months, every year, or every few years.  Give you medicines for: ? High blood pressure. ? Pain. ? Infection. If your aneurysm is larger than 2 inches (5 cm), you may need surgery to fix it. Follow these instructions at home: Eating and drinking  Eat a heart-healthy diet. Eat a lot of: ? Fresh fruits and vegetables. ? Whole grains. ? Low-fat (lean) protein. ? Low-fat dairy products.  Avoid foods that are high in saturated fat and cholesterol. These foods include red meat and some dairy products.   Lifestyle  Do not use any products that contain nicotine or tobacco, such as cigarettes, e-cigarettes, and chewing tobacco. If you need help quitting, ask your doctor.  Stay active and get exercise. Ask your doctor how often to exercise and what types of exercise are safe for you.  Keep a healthy weight.      Alcohol use  Do not drink alcohol if: ? Your doctor tells you not to drink. ? You are pregnant, may be pregnant, or are planning to become pregnant.  If you drink alcohol: ? Limit how much you use to:  0-1 drink a day   for women.  0-2 drinks a day for men. ? Be aware of how much alcohol is in your drink. In the [place] one drink equals one 12 oz bottle of beer (355 mL), one 5 oz glass of wine (148 mL), or one 1 oz glass of hard liquor (44 mL). General instructions  Take over-the-counter and prescription medicines only as told by your doctor.  Keep your blood pressure in a normal range. Check it at regular times. Ask your doctor what level it should be.  Have regular checks of your levels of blood sugar (glucose) and cholesterol. Follow steps to keep these levels near normal.  Avoid heavy  lifting and activities that take a lot of effort. Ask what activities are safe for you.  If you can, learn your family's health history.  Keep all follow-up visits as told by your doctor. This is important. Contact a doctor if:  Your belly, side, or back hurts.  Your belly throbs.  You have a fever. Get help right away if:  You have sudden, bad pain in your belly, side, or back.  You feel like you may vomit or you vomit.  You feel light-headed or you faint.  Your heart beats fast when you stand.  You have sweaty skin that is cold to the touch (clammy).  You are short of breath.  You have trouble pooping.  You have trouble peeing. These symptoms may be an emergency. Do not wait to see if the symptoms will go away. Get medical help right away. Call your local emergency services (911 in the [place] Do not drive yourself to the hospital. Summary  An aneurysm is a bulge in one of the blood vessels that carry blood away from the heart (artery). An abdominal aortic aneurysm happens in the main blood vessel that carries blood away from the heart (aorta).  This condition can cause bleeding inside the body. It can be life-threatening.  Risk can rise if you are female, age 60 or older, and of [place] descent. Risk can also rise from nicotine or tobacco use or having aneurysms in the family.  Get help right away if you have symptoms of a burst aneurysm. This information is not intended to replace advice given to you by your health care provider. Make sure you discuss any questions you have with your health care provider. Document Revised: 06/17/2019 Document Reviewed: 06/17/2019 Elsevier Patient Education  2021 [employer] Inc.  

## 2020-11-06 NOTE — Assessment & Plan Note (Signed)
We had a discussion for approximately 3-4 minutes regarding the absolute need for smoking cessation due to the deleterious nature of tobacco on the vascular system. We discussed the tobacco use would diminish patency of any intervention, and likely significantly worsen progressio of disease. We discussed multiple agents for quitting including replacement therapy or medications to reduce cravings such as Chantix. The patient voices their understanding of the importance of smoking cessation.  

## 2020-11-06 NOTE — Assessment & Plan Note (Signed)
Common association with abdominal aortic aneurysms.

## 2020-11-07 ENCOUNTER — Ambulatory Visit: Payer: PPO

## 2020-11-27 DIAGNOSIS — E871 Hypo-osmolality and hyponatremia: Secondary | ICD-10-CM | POA: Diagnosis not present

## 2021-01-25 DIAGNOSIS — E119 Type 2 diabetes mellitus without complications: Secondary | ICD-10-CM | POA: Diagnosis not present

## 2021-02-01 DIAGNOSIS — E875 Hyperkalemia: Secondary | ICD-10-CM | POA: Diagnosis not present

## 2021-02-10 IMAGING — CT CT HEAD WITHOUT CONTRAST
4 series · 17 of 47 positions shown, 19 images · non-contrast
Comparison: None.

CLINICAL DATA: 72-year-old female with vertigo.

EXAM:
CT HEAD WITHOUT CONTRAST
TECHNIQUE: Contiguous axial images were obtained from the base of the skull
through the vertex without intravenous contrast.

[Series 2: head wo · axial · 0.41mm/px · z∈[+360,+475]mm · 7 of 31 slices shown, 9 images]
[im 4/31  brain]
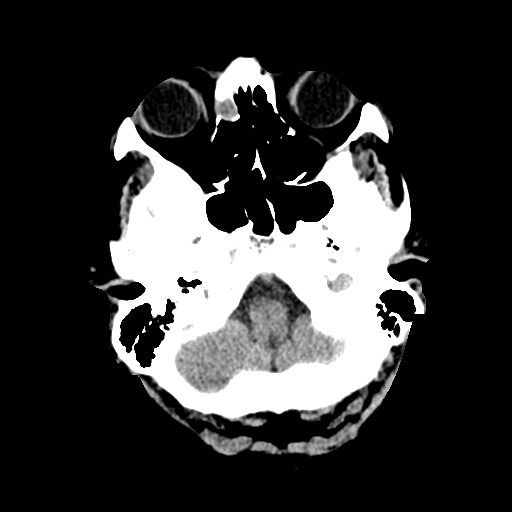
[im 4/31  bone]
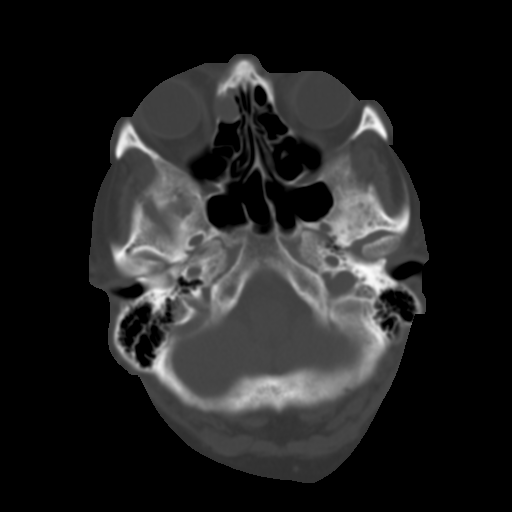
[im 8/31  brain]
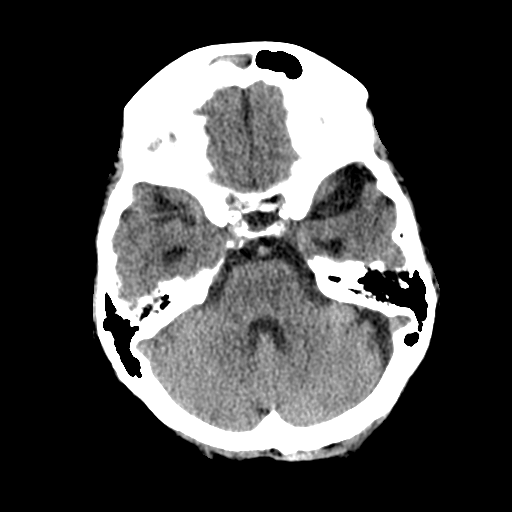
[im 12/31  brain]
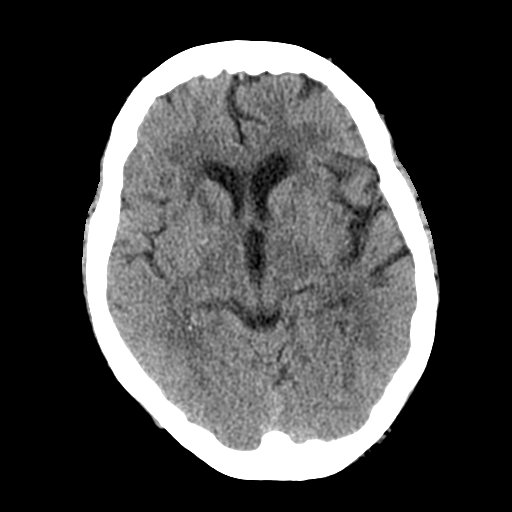
[im 16/31  brain]
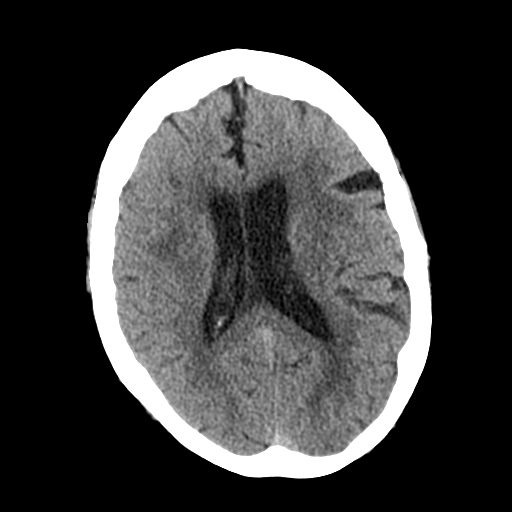
[im 19/31  brain]
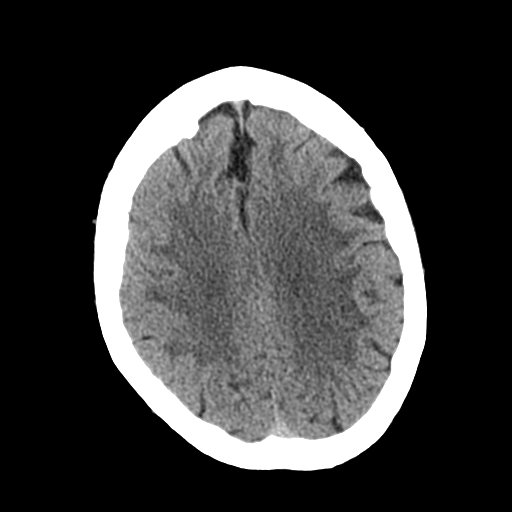
[im 19/31  bone]
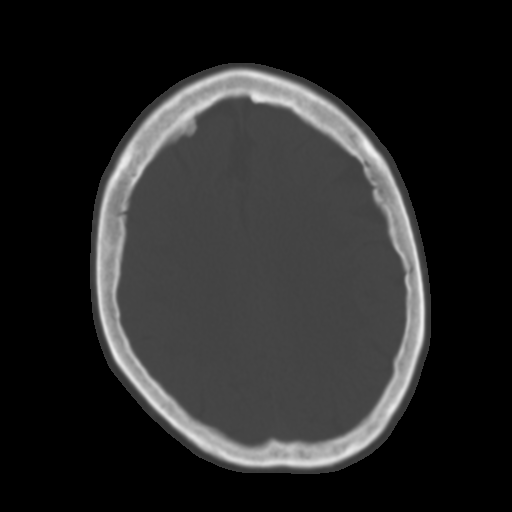
[im 23/31  brain]
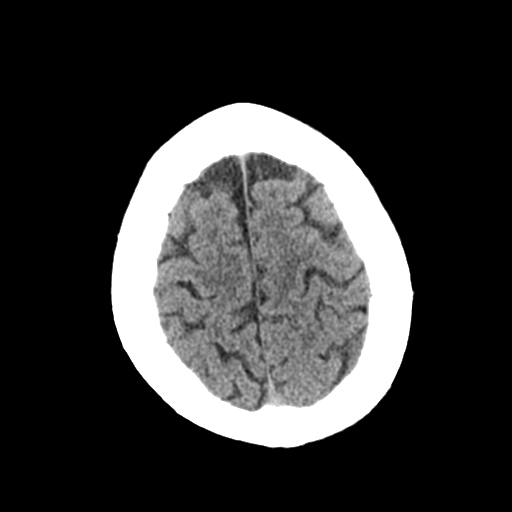
[im 27/31  brain]
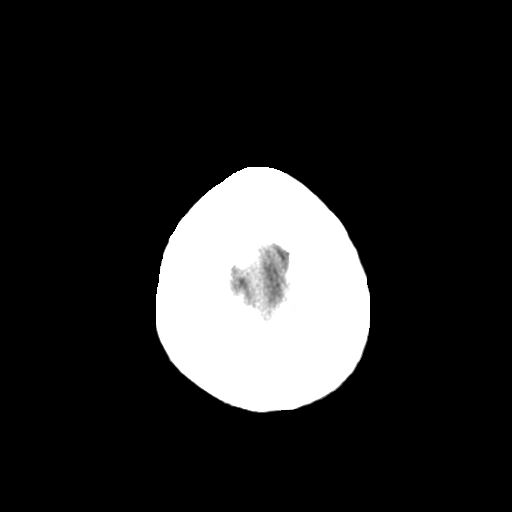

[Series 3: head bone · axial · 0.41mm/px · z∈[+359,+413]mm · 4 of 78 slices shown]
[im 8/78  bone]
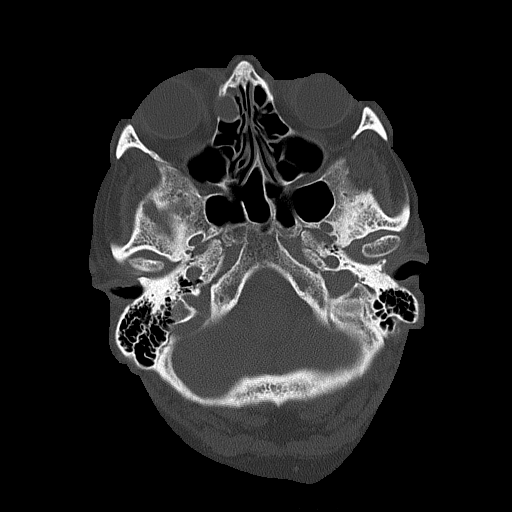
[im 16/78  bone]
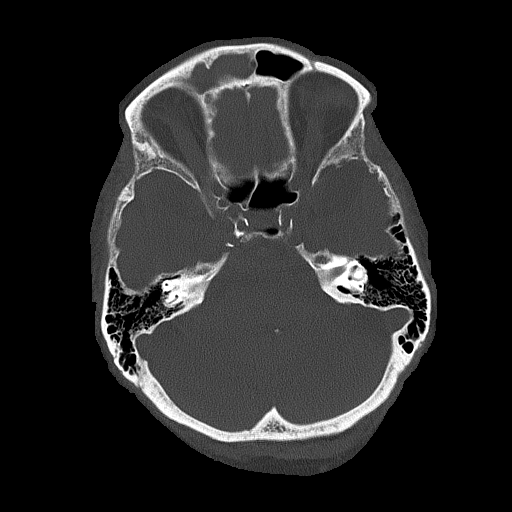
[im 24/78  bone]
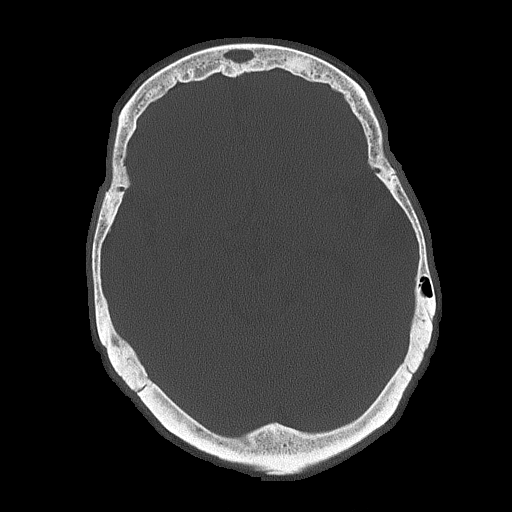
[im 35/78  bone]
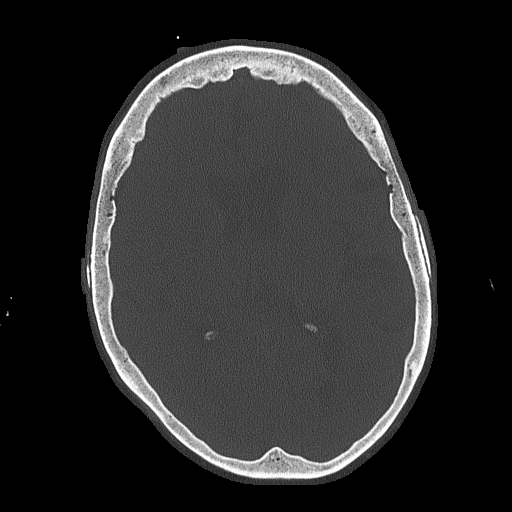

[Series 4: coronal soft tissue · coronal · 0.30mm/px · 3 of 62 slices shown]
[im 21/62  brain]
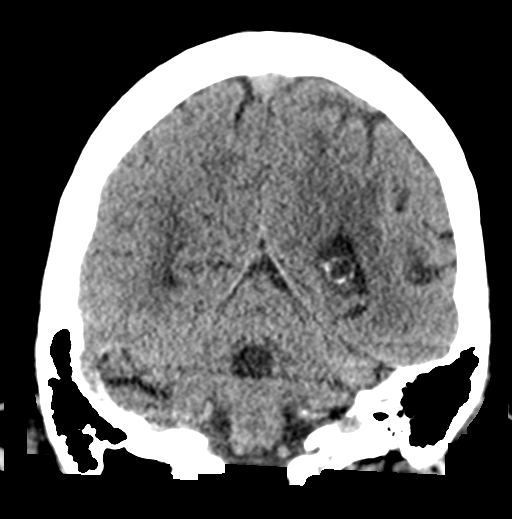
[im 28/62  brain]
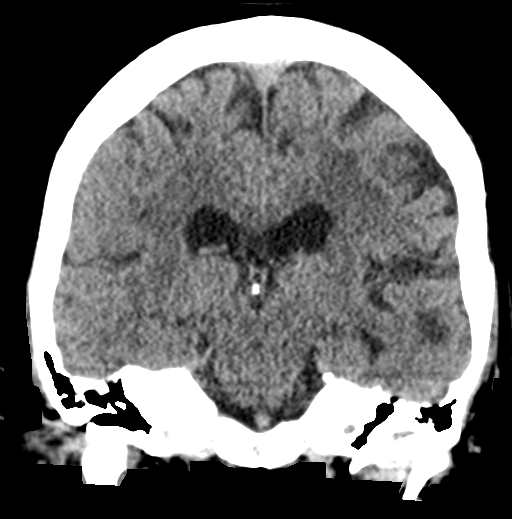
[im 34/62  brain]
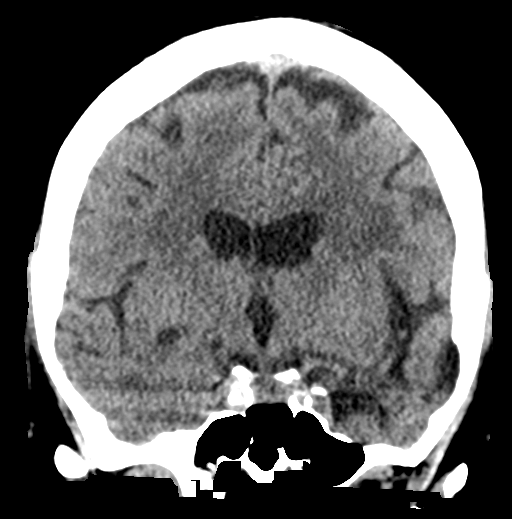

[Series 5: sagittal soft tissue · sagittal · 0.31mm/px · 3 of 49 slices shown]
[im 17/49  brain]
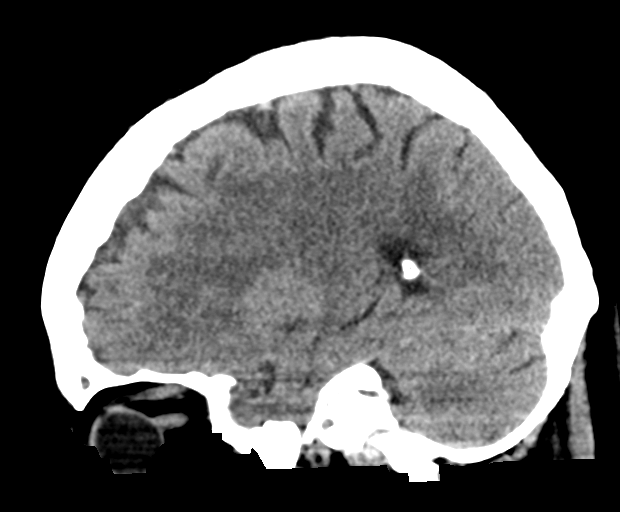
[im 25/49  brain]
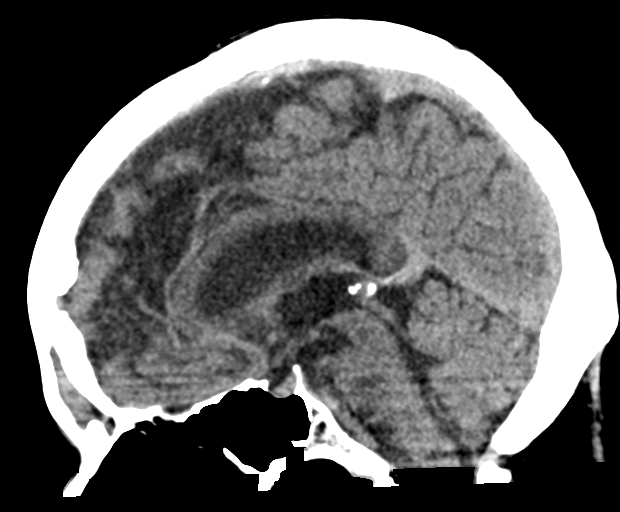
[im 33/49  brain]
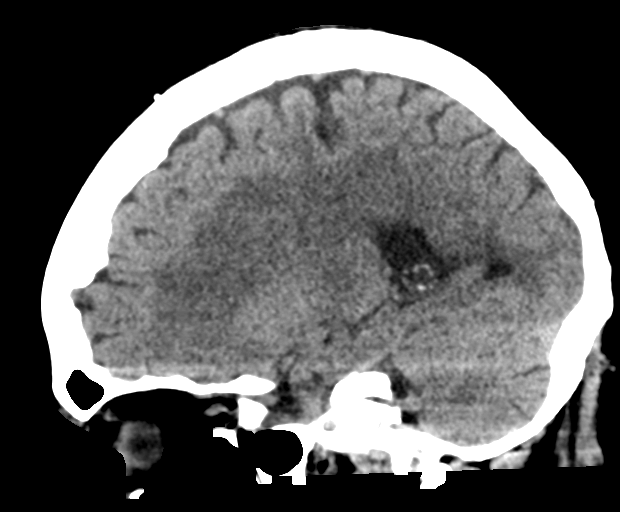

[17 of 47 positions shown; findings below may reference images not displayed]

FINDINGS: Brain: There is moderate age-related atrophy and chronic
microvascular ischemic changes. There is no acute intracranial
hemorrhage. No mass effect or midline shift. No extra-axial fluid
collection.

Vascular: No hyperdense vessel or unexpected calcification.

Skull: Normal. Negative for fracture or focal lesion.

Sinuses/Orbits: There is complete opacification the right frontal
sinus as well as several right ethmoid air cells. No air-fluid
level. The mastoid air cells are clear.

Other: None
IMPRESSION: 1. No acute intracranial hemorrhage.
2. Moderate age-related atrophy and chronic microvascular ischemic
changes.

## 2021-02-12 ENCOUNTER — Ambulatory Visit (INDEPENDENT_AMBULATORY_CARE_PROVIDER_SITE_OTHER): Payer: PPO

## 2021-02-12 ENCOUNTER — Other Ambulatory Visit: Payer: Self-pay

## 2021-02-12 ENCOUNTER — Encounter (INDEPENDENT_AMBULATORY_CARE_PROVIDER_SITE_OTHER): Payer: Self-pay | Admitting: Vascular Surgery

## 2021-02-12 ENCOUNTER — Ambulatory Visit (INDEPENDENT_AMBULATORY_CARE_PROVIDER_SITE_OTHER): Payer: PPO | Admitting: Vascular Surgery

## 2021-02-12 VITALS — BP 147/78 | HR 75 | Ht 63.0 in | Wt 162.0 lb

## 2021-02-12 DIAGNOSIS — E7800 Pure hypercholesterolemia, unspecified: Secondary | ICD-10-CM | POA: Diagnosis not present

## 2021-02-12 DIAGNOSIS — I714 Abdominal aortic aneurysm, without rupture, unspecified: Secondary | ICD-10-CM

## 2021-02-12 DIAGNOSIS — E119 Type 2 diabetes mellitus without complications: Secondary | ICD-10-CM | POA: Diagnosis not present

## 2021-02-12 DIAGNOSIS — F17200 Nicotine dependence, unspecified, uncomplicated: Secondary | ICD-10-CM

## 2021-02-12 NOTE — Assessment & Plan Note (Signed)

## 2021-02-12 NOTE — Progress Notes (Signed)
MRN : [id number]  Diane Anderson is a 75 y.o. ([birth year]) female who presents with chief complaint of  Chief Complaint  Patient presents with  . Follow-up    3 mo U/S  .  History of Present Illness: Patient returns today in follow up of her abdominal aortic aneurysm.  This was seen previously on an MRI and estimated at approximately 3.6 cm.  No aneurysm related symptoms. Specifically, the patient denies new back or abdominal pain, or signs of peripheral embolization. Duplex today shows this to measure 3.85 cm in maximal diameter.  This is only slightly larger than her CT and MRI in the last 6 months or so.   Current Outpatient Medications  Medication Sig Dispense Refill  . ACETAMINOPHEN PO Take by mouth as needed.    . citalopram (CELEXA) 40 MG tablet Take 1 tablet by mouth daily.    . fluticasone (FLONASE) 50 MCG/ACT nasal spray Place 2 sprays into the nose daily as needed.     Marland Kitchen lisinopril (PRINIVIL,ZESTRIL) 2.5 MG tablet Take 1 tablet by mouth daily.    . meclizine (ANTIVERT) 25 MG tablet Take 25 mg by mouth 3 (three) times daily as needed.    . montelukast (SINGULAIR) 10 MG tablet Take 1 tablet by mouth at bedtime.    Marland Kitchen nystatin cream (MYCOSTATIN) Apply topically.    Marland Kitchen omeprazole (PRILOSEC) 20 MG capsule Take 1 capsule by mouth daily as needed.     . ondansetron (ZOFRAN ODT) 4 MG disintegrating tablet Take 1 tablet (4 mg total) by mouth every 6 (six) hours as needed for nausea or vomiting. 20 tablet 0  . predniSONE (DELTASONE) 10 MG tablet Start with 3 pills tomorrow. Taper over the next 6 days.  3,3,2,2,1,1. 12 tablet 0  . rosuvastatin (CRESTOR) 20 MG tablet Take 20 mg by mouth daily.  3  . Vitamin D, Ergocalciferol, (DRISDOL) 50000 units CAPS capsule Take 50,000 Units by mouth every 7 (seven) days.     No current facility-administered medications for this visit.    Past Medical History:  Diagnosis Date  . Allergy    Seasonal   . Anxiety   . Arthritis    left knee,  left shoulder  . Asthma   . Depression   . Diabetes mellitus without complication ([place])    diet controlled after weight loss  . Discharge from left nipple 01/28/2017  . Fibrocystic breast disease   . GERD (gastroesophageal reflux disease)   . Hx of migraines   . Hypercholesteremia   . Hyperlipidemia   . Vertigo    avg 1x/mo  . Vitamin D deficiency   . Wears dentures    full upper    Past Surgical History:  Procedure Laterality Date  . BREAST BIOPSY Right 2011   stereo bx benign  . BREAST BIOPSY Left 04/18/2016   x3. 3:00 2 cmfn benign hyalinizing stroma cyst formation, 3:00 5cmfn benign ductal ectasia, 10:00 intraductal papilloma.  Marland Kitchen BREAST EXCISIONAL BIOPSY Right yrs ago    benign  . BREAST EXCISIONAL BIOPSY Left 05/15/2016   excision to remove papilloma at 10:00 location  . CATARACT EXTRACTION W/PHACO Left 08/10/2018   Procedure: CATARACT EXTRACTION PHACO AND INTRAOCULAR LENS PLACEMENT ([place])  LEFT DIABETIC;  Surgeon: Leandrew Koyanagi, MD;  Location: [place];  Service: Ophthalmology;  Laterality: Left;  Diabetic - diet controlled  . CATARACT EXTRACTION W/PHACO Right 08/31/2018   Procedure: CATARACT EXTRACTION PHACO AND INTRAOCULAR LENS PLACEMENT ([facility])  RIGHT DIABETIC;  Surgeon:  Leandrew Koyanagi, MD;  Location: [place];  Service: Ophthalmology;  Laterality: Right;  Diabetic - diet controlled  . CYST EXCISION     Dr. Pat Patrick  . ETHMOIDECTOMY Right 08/04/2019   Procedure: ETHMOIDECTOMY WITH FRONTAL SINUOTOMY;  Surgeon: Margaretha Sheffield, MD;  Location: [place] [facility];  Service: ENT;  Laterality: Right;  Diabetic - diet controlled  . EXCISION OF BREAST BIOPSY Left 05/15/2016   Procedure: EXCISION OF BREAST BIOPSY;  Surgeon: Hubbard Robinson, MD;  Location: [facility];  Service: General;  Laterality: Left;  . IMAGE GUIDED SINUS SURGERY Right 08/04/2019   Procedure: IMAGE GUIDED SINUS SURGERY;  Surgeon: Margaretha Sheffield, MD;  Location: [place];   Service: ENT;  Laterality: Right;  disk on OR charge nurse desk 11-16 kp  . LIPOMA EXCISION  1995   Right Shoulder  . LYMPH GLAND EXCISION Right 2011  . NASAL SEPTOPLASTY W/ TURBINOPLASTY Bilateral 08/04/2019   Procedure: NASAL SEPTOPLASTY WITH TURBINATE REDUCTION;  Surgeon: Margaretha Sheffield, MD;  Location: [place];  Service: ENT;  Laterality: Bilateral;  . TONSILLECTOMY    . TUBAL LIGATION       Social History   Tobacco Use  . Smoking status: Current Every Day Smoker    Packs/day: 1.00    Years: 52.00    Pack years: 52.00    Types: Cigarettes  . Smokeless tobacco: Never Used  Vaping Use  . Vaping Use: Never used  Substance Use Topics  . Alcohol use: No    Alcohol/week: 0.0 standard drinks    Comment: rarely  . Drug use: No      Family History  Problem Relation Age of Onset  . Dementia Mother   . Cancer Mother        skin  . Osteoporosis Mother   . Alzheimer's disease Father   . Asthma Father   . Hypertension Father   . Breast cancer Maternal Aunt 24  . Cancer Maternal Aunt        Breast Cancer  . Hypertension Maternal Aunt   . Birth defects Cousin 33       Breast  . Breast cancer Cousin   . Breast cancer Cousin   . Hypertension Sister   . Diabetes Paternal Grandmother   . Breast cancer Maternal Grandfather     Allergies  Allergen Reactions  . Cyclobenzaprine Other (See Comments) and Rash    It made her vertigo worse.  Marland Kitchen Penicillins Rash  . Adhesive [Tape] Itching    "heavy" white tape  . Atorvastatin Other (See Comments)    Severe Sweating     REVIEW OF SYSTEMS (Negative unless checked)  Constitutional: [] ?Weight loss  [] ?Fever  [] ?Chills Cardiac: [] ?Chest pain   [] ?Chest pressure   [] ?Palpitations   [] ?Shortness of breath when laying flat   [] ?Shortness of breath at rest   [] ?Shortness of breath with exertion. Vascular:  [] ?Pain in legs with walking   [] ?Pain in legs at rest   [] ?Pain in legs when laying flat   [] ?Claudication   [] ?Pain  in feet when walking  [] ?Pain in feet at rest  [] ?Pain in feet when laying flat   [] ?History of DVT   [] ?Phlebitis   [] ?Swelling in legs   [] ?Varicose veins   [] ?Non-healing ulcers Pulmonary:   [] ?Uses home oxygen   [] ?Productive cough   [] ?Hemoptysis   [] ?Wheeze  [] ?COPD   [] ?Asthma Neurologic:  [] ?Dizziness  [] ?Blackouts   [] ?Seizures   [] ?History of stroke   [] ?History of TIA  [] ?  Aphasia   [] ?Temporary blindness   [] ?Dysphagia   [] ?Weakness or numbness in arms   [] ?Weakness or numbness in legs Musculoskeletal:  [x] ?Arthritis   [] ?Joint swelling   [x] ?Joint pain   [] ?Low back pain Hematologic:  [] ?Easy bruising  [] ?Easy bleeding   [] ?Hypercoagulable state   [] ?Anemic  [] ?Hepatitis Gastrointestinal:  [] ?Blood in stool   [] ?Vomiting blood  [x] ?Gastroesophageal reflux/heartburn   [] ?Abdominal pain Genitourinary:  [] ?Chronic kidney disease   [] ?Difficult urination  [] ?Frequent urination  [] ?Burning with urination   [] ?Hematuria Skin:  [] ?Rashes   [] ?Ulcers   [] ?Wounds Psychological:  [x] ?History of anxiety   [x] ? History of major depression.  Physical Examination  BP (!) 147/78   Pulse 75   Ht 5\' 3"  (1.6 m)   Wt 162 lb (73.5 kg)   BMI 28.70 kg/m  Gen:  WD/WN, NAD.  Appears younger than stated age Head: [place]/AT, No temporalis wasting. Ear/Nose/Throat: Hearing grossly intact, nares w/o erythema or drainage Eyes: Conjunctiva clear. Sclera non-icteric Neck: Supple.  Trachea midline Pulmonary:  Good air movement, no use of accessory muscles.  Cardiac: RRR, no JVD Vascular:  Vessel Right Left  Radial Palpable Palpable           Gastrointestinal: soft, non-tender/non-distended. No guarding/reflex.  Musculoskeletal: M/S 5/5 throughout.  No deformity or atrophy.  No edema. Neurologic: Sensation grossly intact in extremities.  Symmetrical.  Speech is fluent.  Psychiatric: Judgment intact, Mood & affect appropriate for pt's clinical situation. Dermatologic: No rashes or ulcers noted.  No  cellulitis or open wounds.      Labs No results found for this or any previous visit (from the past 2160 hour(s)).  Radiology No results found.  Assessment/Plan Type 2 diabetes mellitus (HCC) blood glucose control important in reducing the progression of atherosclerotic disease. Also, involved in wound healing. On appropriate medications.   Hypercholesteremia lipid control important in reducing the progression of atherosclerotic disease. Continue statin therapy  Tobacco use disorder We had a discussion for approximately 3 minutes regarding the absolute need for smoking cessation due to the deleterious nature of tobacco on the vascular system. We discussed the tobacco use would diminish patency of any intervention, and likely significantly worsen progressio of disease. We discussed multiple agents for quitting including replacement therapy or medications to reduce cravings such as Chantix. The patient voices their understanding of the importance of smoking cessation.   AAA (abdominal aortic aneurysm) without rupture (HCC) Duplex today shows this to measure 3.85 cm in maximal diameter.  This is only slightly larger than her CT and MRI in the last 6 months or so.  We will plan to see her back in about 6 months and follow-up.  Smoking cessation and blood pressure control are important to keep the aneurysm from growing.    Leotis Pain, MD  02/12/2021 12:27 PM    This note was created with Dragon medical transcription system.  Any errors from dictation are purely unintentional

## 2021-02-12 NOTE — Assessment & Plan Note (Signed)
Duplex today shows this to measure 3.85 cm in maximal diameter.  This is only slightly larger than her CT and MRI in the last 6 months or so.  We will plan to see her back in about 6 months and follow-up.  Smoking cessation and blood pressure control are important to keep the aneurysm from growing.

## 2021-02-27 ENCOUNTER — Other Ambulatory Visit: Payer: Self-pay | Admitting: Family Medicine

## 2021-02-27 ENCOUNTER — Ambulatory Visit
Admission: RE | Admit: 2021-02-27 | Discharge: 2021-02-27 | Disposition: A | Discharge status: Home / Self Care | Payer: PPO | Source: Ambulatory Visit | Attending: Family Medicine | Admitting: Family Medicine

## 2021-02-27 ENCOUNTER — Other Ambulatory Visit: Payer: Self-pay

## 2021-02-27 DIAGNOSIS — R600 Localized edema: Secondary | ICD-10-CM | POA: Diagnosis not present

## 2021-02-27 DIAGNOSIS — M79661 Pain in right lower leg: Secondary | ICD-10-CM | POA: Diagnosis not present

## 2021-03-14 DIAGNOSIS — R600 Localized edema: Secondary | ICD-10-CM | POA: Diagnosis not present

## 2021-03-24 ENCOUNTER — Inpatient Hospital Stay
Admission: EM | Admit: 2021-03-24 | Discharge: 2021-03-27 | DRG: 392 | Disposition: A | Discharge status: Home / Self Care | Payer: PPO | Attending: Internal Medicine | Admitting: Internal Medicine

## 2021-03-24 ENCOUNTER — Other Ambulatory Visit: Payer: Self-pay

## 2021-03-24 DIAGNOSIS — Z888 Allergy status to other drugs, medicaments and biological substances status: Secondary | ICD-10-CM | POA: Diagnosis not present

## 2021-03-24 DIAGNOSIS — I714 Abdominal aortic aneurysm, without rupture, unspecified: Secondary | ICD-10-CM | POA: Diagnosis present

## 2021-03-24 DIAGNOSIS — K219 Gastro-esophageal reflux disease without esophagitis: Secondary | ICD-10-CM | POA: Diagnosis present

## 2021-03-24 DIAGNOSIS — Z880 Allergy status to penicillin: Secondary | ICD-10-CM

## 2021-03-24 DIAGNOSIS — Z8262 Family history of osteoporosis: Secondary | ICD-10-CM

## 2021-03-24 DIAGNOSIS — K6389 Other specified diseases of intestine: Secondary | ICD-10-CM | POA: Diagnosis not present

## 2021-03-24 DIAGNOSIS — E871 Hypo-osmolality and hyponatremia: Secondary | ICD-10-CM | POA: Diagnosis not present

## 2021-03-24 DIAGNOSIS — F17210 Nicotine dependence, cigarettes, uncomplicated: Secondary | ICD-10-CM | POA: Diagnosis not present

## 2021-03-24 DIAGNOSIS — N2889 Other specified disorders of kidney and ureter: Secondary | ICD-10-CM | POA: Diagnosis not present

## 2021-03-24 DIAGNOSIS — M1990 Unspecified osteoarthritis, unspecified site: Secondary | ICD-10-CM | POA: Diagnosis not present

## 2021-03-24 DIAGNOSIS — Z20822 Contact with and (suspected) exposure to covid-19: Secondary | ICD-10-CM | POA: Diagnosis not present

## 2021-03-24 DIAGNOSIS — K529 Noninfective gastroenteritis and colitis, unspecified: Principal | ICD-10-CM | POA: Diagnosis present

## 2021-03-24 DIAGNOSIS — Z833 Family history of diabetes mellitus: Secondary | ICD-10-CM

## 2021-03-24 DIAGNOSIS — N179 Acute kidney failure, unspecified: Secondary | ICD-10-CM | POA: Diagnosis present

## 2021-03-24 DIAGNOSIS — Z803 Family history of malignant neoplasm of breast: Secondary | ICD-10-CM

## 2021-03-24 DIAGNOSIS — E119 Type 2 diabetes mellitus without complications: Secondary | ICD-10-CM | POA: Diagnosis not present

## 2021-03-24 DIAGNOSIS — R6510 Systemic inflammatory response syndrome (SIRS) of non-infectious origin without acute organ dysfunction: Secondary | ICD-10-CM | POA: Diagnosis not present

## 2021-03-24 DIAGNOSIS — Z79899 Other long term (current) drug therapy: Secondary | ICD-10-CM | POA: Diagnosis not present

## 2021-03-24 DIAGNOSIS — F32A Depression, unspecified: Secondary | ICD-10-CM | POA: Diagnosis present

## 2021-03-24 DIAGNOSIS — I10 Essential (primary) hypertension: Secondary | ICD-10-CM | POA: Diagnosis not present

## 2021-03-24 DIAGNOSIS — E785 Hyperlipidemia, unspecified: Secondary | ICD-10-CM | POA: Diagnosis not present

## 2021-03-24 DIAGNOSIS — K5900 Constipation, unspecified: Secondary | ICD-10-CM | POA: Diagnosis present

## 2021-03-24 DIAGNOSIS — Z820 Family history of epilepsy and other diseases of the nervous system: Secondary | ICD-10-CM | POA: Diagnosis not present

## 2021-03-24 DIAGNOSIS — Z825 Family history of asthma and other chronic lower respiratory diseases: Secondary | ICD-10-CM | POA: Diagnosis not present

## 2021-03-24 DIAGNOSIS — Z8249 Family history of ischemic heart disease and other diseases of the circulatory system: Secondary | ICD-10-CM

## 2021-03-24 DIAGNOSIS — R42 Dizziness and giddiness: Secondary | ICD-10-CM | POA: Diagnosis not present

## 2021-03-24 DIAGNOSIS — K922 Gastrointestinal hemorrhage, unspecified: Secondary | ICD-10-CM | POA: Diagnosis not present

## 2021-03-24 LAB — COMPREHENSIVE METABOLIC PANEL
ALT: 14 U/L (ref 0–44)
AST: 23 U/L (ref 15–41)
Albumin: 4.1 g/dL (ref 3.5–5.0)
Alkaline Phosphatase: 66 U/L (ref 38–126)
Anion gap: 10 (ref 5–15)
BUN: 25 mg/dL — ABNORMAL HIGH (ref 8–23)
CO2: 22 mmol/L (ref 22–32)
Calcium: 9.4 mg/dL (ref 8.9–10.3)
Chloride: 97 mmol/L — ABNORMAL LOW (ref 98–111)
Creatinine, Ser: 1.78 mg/dL — ABNORMAL HIGH (ref 0.44–1.00)
GFR, Estimated: 29 mL/min — ABNORMAL LOW (ref >60)
Glucose, Bld: 109 mg/dL — ABNORMAL HIGH (ref 70–99)
Potassium: 4.2 mmol/L (ref 3.5–5.1)
Sodium: 129 mmol/L — ABNORMAL LOW (ref 135–145)
Total Bilirubin: 0.4 mg/dL (ref 0.3–1.2)
Total Protein: 7.1 g/dL (ref 6.5–8.1)

## 2021-03-24 LAB — CBC
HCT: 44.1 % (ref 36.0–46.0)
Hemoglobin: 15.2 g/dL — ABNORMAL HIGH (ref 12.0–15.0)
MCH: 29.4 pg (ref 26.0–34.0)
MCHC: 34.5 g/dL (ref 30.0–36.0)
MCV: 85.3 fL (ref 80.0–100.0)
Platelets: 335 10*3/uL (ref 150–400)
RBC: 5.17 MIL/uL — ABNORMAL HIGH (ref 3.87–5.11)
RDW: 13.6 % (ref 11.5–15.5)
WBC: 20.1 10*3/uL — ABNORMAL HIGH (ref 4.0–10.5)
nRBC: 0 % (ref 0.0–0.2)

## 2021-03-24 LAB — TROPONIN I (HIGH SENSITIVITY): Troponin I (High Sensitivity): 12 ng/L (ref <18)

## 2021-03-24 LAB — TYPE AND SCREEN
ABO/RH(D): O POS
Antibody Screen: NEGATIVE

## 2021-03-24 MED ORDER — ONDANSETRON HCL 4 MG/2ML IJ SOLN
4.0000 mg | Freq: Once | INTRAMUSCULAR | Status: AC
  Administered 2021-03-25: 4 mg via INTRAVENOUS
  Filled 2021-03-24: qty 2

## 2021-03-24 MED ORDER — SODIUM CHLORIDE 0.9 % IV BOLUS
500.0000 mL | Freq: Once | INTRAVENOUS | Status: AC
  Administered 2021-03-25: 500 mL via INTRAVENOUS

## 2021-03-24 MED ORDER — FENTANYL CITRATE (PF) 100 MCG/2ML IJ SOLN
50.0000 ug | Freq: Once | INTRAMUSCULAR | Status: AC
  Administered 2021-03-25: 50 ug via INTRAVENOUS
  Filled 2021-03-24: qty 2

## 2021-03-24 NOTE — ED Triage Notes (Addendum)
Pt states she has had vertigo for 5-6 years, and was taking her medicine for vertigo and  threw up once today and once yesterday. Pt states this is unusual for her. Pt states she was also feeling constipated and took OTC laxatives and now has rectal bleeding. Pt states it was watery and blood tinged, pt states she also had one episode of darker red blood. Pt also with c/o pain in LLQ.

## 2021-03-24 NOTE — ED Provider Notes (Signed)
[facility] Emergency Department Provider Note  ____________________________________________   Event Date/Time   First MD Initiated Contact with Patient 03/24/21 2258     (approximate)  I have reviewed the triage vital signs and the nursing notes.   HISTORY  Chief Complaint Dizziness and Rectal Bleeding    HPI Diane Anderson is a 75 y.o. female with depression, diabetes, vertigo who comes in with concerns for vomiting and abdominal pain.  Patient reports that she has daily dizziness secondary to her vertigo.  She reports having to take vertigo medication every day.  She states that she did not really feel many more dizzy than normal but she had 2 episodes of vomiting that were watery in nature yesterday and today.  She then developed worsening abdominal pain and felt like she was constipated so she took 2 laxatives over-the-counter.  She states afterwards she started having rectal bleeding that she states was just blood coming out of her rectum and was not just when she wiped.  Her abdominal pain is in the left lower quadrant, constant, nothing makes it better or worse.          Past Medical History:  Diagnosis Date   Allergy    Seasonal    Anxiety    Arthritis    left knee, left shoulder   Asthma    Depression    Diabetes mellitus without complication ([place])    diet controlled after weight loss   Discharge from left nipple 01/28/2017   Fibrocystic breast disease    GERD (gastroesophageal reflux disease)    Hx of migraines    Hypercholesteremia    Hyperlipidemia    Vertigo    avg 1x/mo   Vitamin D deficiency    Wears dentures    full upper    Patient Active Problem List   Diagnosis Date Noted   Tobacco use disorder 11/06/2020   AAA (abdominal aortic aneurysm) without rupture ([place]) 07/12/2020   Aortic atherosclerosis ([place]) 07/12/2020   Lesion of right native kidney 07/12/2020   Pulmonary nodule, left 07/12/2020   Vomiting  04/26/2019   Chronic right shoulder pain 02/10/2019   Localized osteoarthritis of hands, bilateral 02/10/2019   Chronic midline low back pain with sciatica 12/27/2018   Hypercholesteremia    Allergy    Hx of migraines    Vitamin D deficiency    Acid reflux 03/13/2016   Pure hypercholesterolemia 03/13/2016   Type 2 diabetes mellitus ([place]) 08/21/2015   Recurrent major depressive disorder, in partial remission ([place]) 08/21/2015   Clinical depression 05/30/2014   Anxiety 01/19/2014   Avitaminosis D 01/19/2014    Past Surgical History:  Procedure Laterality Date   BREAST BIOPSY Right 2011   stereo bx benign   BREAST BIOPSY Left 04/18/2016   x3. 3:00 2 cmfn benign hyalinizing stroma cyst formation, 3:00 5cmfn benign ductal ectasia, 10:00 intraductal papilloma.   BREAST EXCISIONAL BIOPSY Right yrs ago    benign   BREAST EXCISIONAL BIOPSY Left 05/15/2016   excision to remove papilloma at 10:00 location   CATARACT EXTRACTION W/PHACO Left 08/10/2018   Procedure: CATARACT EXTRACTION PHACO AND INTRAOCULAR LENS PLACEMENT ([place])  LEFT DIABETIC;  Surgeon: Leandrew Koyanagi, MD;  Location: [place];  Service: Ophthalmology;  Laterality: Left;  Diabetic - diet controlled   CATARACT EXTRACTION W/PHACO Right 08/31/2018   Procedure: CATARACT EXTRACTION PHACO AND INTRAOCULAR LENS PLACEMENT ([place])  RIGHT DIABETIC;  Surgeon: Leandrew Koyanagi, MD;  Location: [place];  Service: Ophthalmology;  Laterality: Right;  Diabetic - diet controlled   CYST EXCISION     Dr. Pat Patrick   ETHMOIDECTOMY Right 08/04/2019   Procedure: ETHMOIDECTOMY WITH FRONTAL SINUOTOMY;  Surgeon: Margaretha Sheffield, MD;  Location: [place];  Service: ENT;  Laterality: Right;  Diabetic - diet controlled   EXCISION OF BREAST BIOPSY Left 05/15/2016   Procedure: EXCISION OF BREAST BIOPSY;  Surgeon: Hubbard Robinson, MD;  Location: [facility];  Service: General;  Laterality: Left;   IMAGE GUIDED SINUS SURGERY Right  08/04/2019   Procedure: IMAGE GUIDED SINUS SURGERY;  Surgeon: Margaretha Sheffield, MD;  Location: [place];  Service: ENT;  Laterality: Right;  disk on OR charge nurse desk 11-16 [place]   Right Shoulder   LYMPH GLAND EXCISION Right 2011   NASAL SEPTOPLASTY W/ TURBINOPLASTY Bilateral 08/04/2019   Procedure: NASAL SEPTOPLASTY WITH TURBINATE REDUCTION;  Surgeon: Margaretha Sheffield, MD;  Location: [place];  Service: ENT;  Laterality: Bilateral;   TONSILLECTOMY     TUBAL LIGATION      Prior to Admission medications   Medication Sig Start Date End Date Taking? Authorizing Provider  ACETAMINOPHEN PO Take by mouth as needed.    [provider]  citalopram (CELEXA) 40 MG tablet Take 1 tablet by mouth daily. 02/04/16   [provider]  fluticasone (FLONASE) 50 MCG/ACT nasal spray Place 2 sprays into the nose daily as needed.  01/24/16   [provider]  lisinopril (PRINIVIL,ZESTRIL) 2.5 MG tablet Take 1 tablet by mouth daily. 04/04/16   [provider]  meclizine (ANTIVERT) 25 MG tablet Take 25 mg by mouth 3 (three) times daily as needed. 10/23/20   [provider]  montelukast (SINGULAIR) 10 MG tablet Take 1 tablet by mouth at bedtime. 04/06/17 07/27/19  [provider]  nystatin cream (MYCOSTATIN) Apply topically. 07/10/20 07/10/21  [provider]  omeprazole (PRILOSEC) 20 MG capsule Take 1 capsule by mouth daily as needed.  02/04/16   [provider]  ondansetron (ZOFRAN ODT) 4 MG disintegrating tablet Take 1 tablet (4 mg total) by mouth every 6 (six) hours as needed for nausea or vomiting. 05/09/19   Delman Kitten, MD  predniSONE (DELTASONE) 10 MG tablet Start with 3 pills tomorrow. Taper over the next 6 days.  3,3,2,2,1,1. 08/04/19   Margaretha Sheffield, MD  rosuvastatin (CRESTOR) 20 MG tablet Take 20 mg by mouth daily. 03/04/18   [provider]  Vitamin D, Ergocalciferol, (DRISDOL) 50000 units CAPS  capsule Take 50,000 Units by mouth every 7 (seven) days.    [provider]    Allergies Cyclobenzaprine, Penicillins, Adhesive [tape], and Atorvastatin  Family History  Problem Relation Age of Onset   Dementia Mother    Cancer Mother        skin   Osteoporosis Mother    Alzheimer's disease Father    Asthma Father    Hypertension Father    Breast cancer Maternal Aunt 70   Cancer Maternal Aunt        Breast Cancer   Hypertension Maternal Aunt    Birth defects Cousin 35       Breast   Breast cancer Cousin    Breast cancer Cousin    Hypertension Sister    Diabetes Paternal Grandmother    Breast cancer Maternal Grandfather     Social History Social History   Tobacco Use   Smoking status: Every Day    Packs/day: 1.00  Years: 52.00    Pack years: 52.00    Types: Cigarettes   Smokeless tobacco: Never  Vaping Use   Vaping Use: Never used  Substance Use Topics   Alcohol use: No    Alcohol/week: 0.0 standard drinks    Comment: rarely   Drug use: No      Review of Systems Constitutional: No fever/chills, dizzy Eyes: No visual changes. ENT: No sore throat. Cardiovascular: Denies chest pain. Respiratory: Denies shortness of breath. Gastrointestinal: Abdominal pain, vomiting, rectal bleeding Genitourinary: Negative for dysuria. Musculoskeletal: Negative for back pain. Skin: Negative for rash. Neurological: Negative for headaches, focal weakness or numbness. All other ROS negative ____________________________________________   PHYSICAL EXAM:  VITAL SIGNS: ED Triage Vitals  Enc Vitals Group     BP 03/24/21 2032 (!) 124/59     Pulse Rate 03/24/21 2032 83     Resp 03/24/21 2032 20     Temp 03/24/21 2032 98.2 F (36.8 C)     Temp Source 03/24/21 2032 Oral     SpO2 03/24/21 2040 97 %     Weight 03/24/21 2033 160 lb (72.6 kg)     Height 03/24/21 2033 5\' 3"  (1.6 m)     Head Circumference --      Peak Flow --      Pain Score 03/24/21 2033 8      Pain Loc --      Pain Edu? --      Excl. in [place]? --     Constitutional: Alert and oriented. Well appearing and in no acute distress. Eyes: Conjunctivae are normal. EOMI. Head: Atraumatic. Nose: No congestion/rhinnorhea. Mouth/Throat: Mucous membranes are moist.   Neck: No stridor. Trachea Midline. FROM Cardiovascular: Normal rate, regular rhythm. Grossly normal heart sounds.  Good peripheral circulation. Respiratory: Normal respiratory effort.  No retractions. Lungs CTAB. Gastrointestinal: Tender in the left lower quadrant. No distention. No abdominal bruits.  Musculoskeletal: No lower extremity tenderness nor edema.  No joint effusions. Neurologic:  Normal speech and language. No gross focal neurologic deficits are appreciated.  Skin:  Skin is warm, dry and intact. No rash noted. Psychiatric: Mood and affect are normal. Speech and behavior are normal. GU: Positive mild amount of bright red blood per rectum  ____________________________________________   LABS (all labs ordered are listed, but only abnormal results are displayed)  Labs Reviewed  COMPREHENSIVE METABOLIC PANEL - Abnormal; Notable for the following components:      Result Value   Sodium 129 (*)    Chloride 97 (*)    Glucose, Bld 109 (*)    BUN 25 (*)    Creatinine, Ser 1.78 (*)    GFR, Estimated 29 (*)    All other components within normal limits  CBC - Abnormal; Notable for the following components:   WBC 20.1 (*)    RBC 5.17 (*)    Hemoglobin 15.2 (*)    All other components within normal limits  POC OCCULT BLOOD, ED  TYPE AND SCREEN   ____________________________________________   ED ECG REPORT I, Vanessa Hadley, the attending physician, personally viewed and interpreted this ECG.  Normal sinus rate of 86, no ST elevation, T wave version in aVL, normal intervals ____________________________________________  RADIOLOGY   Official radiology report(s): CT ABDOMEN PELVIS WO CONTRAST  Result Date:  03/25/2021 CLINICAL DATA:  GI bleeding and left lower quadrant pain EXAM: CT ABDOMEN AND PELVIS WITHOUT CONTRAST TECHNIQUE: Multidetector CT imaging of the abdomen and pelvis was performed following the standard protocol  without IV contrast. COMPARISON:  07/05/2020 FINDINGS: Lower chest: No acute abnormality. Hepatobiliary: No focal liver abnormality is seen. No gallstones, gallbladder wall thickening, or biliary dilatation. Pancreas: Unremarkable. No pancreatic ductal dilatation or surrounding inflammatory changes. Spleen: Normal in size without focal abnormality. Adrenals/Urinary Tract: Stable left adrenal lesion is noted incompletely evaluated on this exam. The right adrenal gland is within normal limits. Kidneys are well visualize without renal calculi or obstructive changes. Cystic appearing lesion is noted in the upper pole of the right kidney stable from the prior study. No obstructive changes are seen. The bladder is partially distended. Stomach/Bowel: Fluid is noted throughout the colon consistent with the diarrheal state. Mild pericolonic inflammatory changes noted in the sigmoid likely related to colitis. No perforation is noted. No obstructive changes are noted. Cecum is mobile in the mid abdomen stable from the prior exam. The appendix is not well seen. No inflammatory changes are noted. Small bowel and stomach appear within normal limits. Vascular/Lymphatic: Atherosclerotic changes are noted with dilatation of the infrarenal aorta to 4.1 cm. This is an increase of 3 mm when compared with the prior exam. Tapering distally is noted. No significant lymphadenopathy is seen. Reproductive: Uterus and bilateral adnexa are unremarkable. Other: No abdominal wall hernia or abnormality. No abdominopelvic ascites. Musculoskeletal: Degenerative changes of lumbar spine are noted. IMPRESSION: Fluid throughout the colon consistent with the diarrheal state. Mild pericolonic inflammatory change in the sigmoid is seen  consistent with colitis. No diverticulitis is seen. Stable left adrenal lesion. Stable cystic lesion in the right kidney. Dilatation of the abdominal aorta to 4.1 cm. This is a slight increased from the prior exam at 3.8 cm. Recommend follow-up every 12 months and vascular consultation. This recommendation follows ACR consensus guidelines: White Paper of the ACR Incidental Findings Committee II on Vascular Findings. J Am Coll Radiol 2013; 10:789-794. Electronically Signed   By: Inez Catalina M.D.   On: 03/25/2021 00:35    ____________________________________________   PROCEDURES  Procedure(s) performed (including Critical Care):  Procedures   ____________________________________________   INITIAL IMPRESSION / ASSESSMENT AND PLAN / ED COURSE  Diane Anderson was evaluated in Emergency Department on 03/24/2021 for the symptoms described in the history of present illness. She was evaluated in the context of the global COVID-19 pandemic, which necessitated consideration that the patient might be at risk for infection with the SARS-CoV-2 virus that causes COVID-19. Institutional protocols and algorithms that pertain to the evaluation of patients at risk for COVID-19 are in a state of rapid change based on information released by regulatory bodies including the CDC and federal and state organizations. These policies and algorithms were followed during the patient's care in the ED.    Will get labs to evaluate for anemia, electrolyte abnormalities, AKI and CT to evaluate for diverticulitis, diverticulitis.  Unable to do angio study due to patient's kidney function but her bleeding at this time is very minimal and do not feel like she has a brisk bleed that would need to have an intervention with IR and therefore we will hold off on contrast.  Sodium is a little bit low at 129 but she, chronically runs a little low.  Her kidney function is elevated.  Her white count is elevated  Patient will be  given some IV fluids, IV fentanyl for pain and IV Zofran.  Unable to do contrast due to her kidney function.  CT without contrast was ordered.  CT scan does show colitis.   There also is  noted to have an abdominal aorta aneurysm that is enlarged to 4.1 cm.  Obviously this is limited secondary to nocontrast but right now her vitals are stable.  Potentially if her symptoms are worsening if her kidney functions getting better they can consider repeat CT but at this time she is hemodynamically stable and appears well.  I did talk to the radiologist he said there is no secondary signs of any leaking and not feel like a repeat CT with contrast would be necessary at this time.  Patient is hemodynamically stable and I think this is reasonable I suspect that her pain is more likely from the colitis.  Her lactate is normal therefore unlikely ischemic colitis.  Patient started on antibiotics due to her elevated white count.  I did talk with patient and she is continue to have some pain and given her age, elevated white count and CT findings felt more comfortable with admission to the hospital    ____________________________________________   FINAL CLINICAL IMPRESSION(S) / ED DIAGNOSES   Final diagnoses:  Colitis  AKI (acute kidney injury) ([place])      MEDICATIONS GIVEN DURING THIS VISIT:  Medications  metroNIDAZOLE (FLAGYL) IVPB 500 mg (has no administration in time range)  ceFEPIme (MAXIPIME) 2 g in sodium chloride 0.9 % 100 mL IVPB (has no administration in time range)  sodium chloride 0.9 % bolus 500 mL (500 mLs Intravenous New Bag/Given 03/25/21 0140)  ondansetron (ZOFRAN) injection 4 mg (4 mg Intravenous Given 03/25/21 0141)  fentaNYL (SUBLIMAZE) injection 50 mcg (50 mcg Intravenous Given 03/25/21 0143)     ED Discharge Orders     None        Note:  This document was prepared using Dragon voice recognition software and may include unintentional dictation errors.    Vanessa Delevan,  MD 03/25/21 0230

## 2021-03-25 ENCOUNTER — Emergency Department: Payer: PPO

## 2021-03-25 ENCOUNTER — Other Ambulatory Visit: Payer: PPO

## 2021-03-25 DIAGNOSIS — E871 Hypo-osmolality and hyponatremia: Secondary | ICD-10-CM | POA: Diagnosis present

## 2021-03-25 DIAGNOSIS — Z880 Allergy status to penicillin: Secondary | ICD-10-CM | POA: Diagnosis not present

## 2021-03-25 DIAGNOSIS — Z888 Allergy status to other drugs, medicaments and biological substances status: Secondary | ICD-10-CM | POA: Diagnosis not present

## 2021-03-25 DIAGNOSIS — K529 Noninfective gastroenteritis and colitis, unspecified: Principal | ICD-10-CM

## 2021-03-25 DIAGNOSIS — Z833 Family history of diabetes mellitus: Secondary | ICD-10-CM | POA: Diagnosis not present

## 2021-03-25 DIAGNOSIS — M1990 Unspecified osteoarthritis, unspecified site: Secondary | ICD-10-CM | POA: Diagnosis present

## 2021-03-25 DIAGNOSIS — Z8249 Family history of ischemic heart disease and other diseases of the circulatory system: Secondary | ICD-10-CM | POA: Diagnosis not present

## 2021-03-25 DIAGNOSIS — Z79899 Other long term (current) drug therapy: Secondary | ICD-10-CM | POA: Diagnosis not present

## 2021-03-25 DIAGNOSIS — F17210 Nicotine dependence, cigarettes, uncomplicated: Secondary | ICD-10-CM | POA: Diagnosis present

## 2021-03-25 DIAGNOSIS — K5900 Constipation, unspecified: Secondary | ICD-10-CM | POA: Diagnosis present

## 2021-03-25 DIAGNOSIS — Z820 Family history of epilepsy and other diseases of the nervous system: Secondary | ICD-10-CM | POA: Diagnosis not present

## 2021-03-25 DIAGNOSIS — E119 Type 2 diabetes mellitus without complications: Secondary | ICD-10-CM | POA: Diagnosis present

## 2021-03-25 DIAGNOSIS — I714 Abdominal aortic aneurysm, without rupture: Secondary | ICD-10-CM | POA: Diagnosis present

## 2021-03-25 DIAGNOSIS — K219 Gastro-esophageal reflux disease without esophagitis: Secondary | ICD-10-CM | POA: Diagnosis present

## 2021-03-25 DIAGNOSIS — E785 Hyperlipidemia, unspecified: Secondary | ICD-10-CM | POA: Diagnosis present

## 2021-03-25 DIAGNOSIS — I10 Essential (primary) hypertension: Secondary | ICD-10-CM | POA: Diagnosis present

## 2021-03-25 DIAGNOSIS — Z20822 Contact with and (suspected) exposure to covid-19: Secondary | ICD-10-CM | POA: Diagnosis present

## 2021-03-25 DIAGNOSIS — N179 Acute kidney failure, unspecified: Secondary | ICD-10-CM | POA: Diagnosis present

## 2021-03-25 DIAGNOSIS — Z825 Family history of asthma and other chronic lower respiratory diseases: Secondary | ICD-10-CM | POA: Diagnosis not present

## 2021-03-25 DIAGNOSIS — Z8262 Family history of osteoporosis: Secondary | ICD-10-CM | POA: Diagnosis not present

## 2021-03-25 DIAGNOSIS — R6510 Systemic inflammatory response syndrome (SIRS) of non-infectious origin without acute organ dysfunction: Secondary | ICD-10-CM | POA: Diagnosis present

## 2021-03-25 DIAGNOSIS — Z803 Family history of malignant neoplasm of breast: Secondary | ICD-10-CM | POA: Diagnosis not present

## 2021-03-25 DIAGNOSIS — F32A Depression, unspecified: Secondary | ICD-10-CM | POA: Diagnosis present

## 2021-03-25 LAB — CBC
HCT: 39.8 % (ref 36.0–46.0)
Hemoglobin: 13.8 g/dL (ref 12.0–15.0)
MCH: 29.2 pg (ref 26.0–34.0)
MCHC: 34.7 g/dL (ref 30.0–36.0)
MCV: 84.1 fL (ref 80.0–100.0)
Platelets: 301 10*3/uL (ref 150–400)
RBC: 4.73 MIL/uL (ref 3.87–5.11)
RDW: 13.6 % (ref 11.5–15.5)
WBC: 16.1 10*3/uL — ABNORMAL HIGH (ref 4.0–10.5)
nRBC: 0 % (ref 0.0–0.2)

## 2021-03-25 LAB — GLUCOSE, CAPILLARY
Glucose-Capillary: 118 mg/dL — ABNORMAL HIGH (ref 70–99)
Glucose-Capillary: 106 mg/dL — ABNORMAL HIGH (ref 70–99)
Glucose-Capillary: 97 mg/dL (ref 70–99)
Glucose-Capillary: 89 mg/dL (ref 70–99)

## 2021-03-25 LAB — RESP PANEL BY RT-PCR (FLU A&B, COVID) ARPGX2
Influenza A by PCR: NEGATIVE
Influenza B by PCR: NEGATIVE
SARS Coronavirus 2 by RT PCR: NEGATIVE

## 2021-03-25 LAB — LACTIC ACID, PLASMA: Lactic Acid, Venous: 0.9 mmol/L (ref 0.5–1.9)

## 2021-03-25 LAB — BASIC METABOLIC PANEL
Anion gap: 7 (ref 5–15)
BUN: 29 mg/dL — ABNORMAL HIGH (ref 8–23)
CO2: 24 mmol/L (ref 22–32)
Calcium: 8.6 mg/dL — ABNORMAL LOW (ref 8.9–10.3)
Chloride: 98 mmol/L (ref 98–111)
Creatinine, Ser: 1.4 mg/dL — ABNORMAL HIGH (ref 0.44–1.00)
GFR, Estimated: 39 mL/min — ABNORMAL LOW (ref >60)
Glucose, Bld: 115 mg/dL — ABNORMAL HIGH (ref 70–99)
Potassium: 3.5 mmol/L (ref 3.5–5.1)
Sodium: 129 mmol/L — ABNORMAL LOW (ref 135–145)

## 2021-03-25 LAB — HEMOGLOBIN AND HEMATOCRIT, BLOOD
HCT: 41.5 % (ref 36.0–46.0)
HCT: 38.7 % (ref 36.0–46.0)
HCT: 37.6 % (ref 36.0–46.0)
Hemoglobin: 14.5 g/dL (ref 12.0–15.0)
Hemoglobin: 13.4 g/dL (ref 12.0–15.0)
Hemoglobin: 12.8 g/dL (ref 12.0–15.0)

## 2021-03-25 LAB — HEMOGLOBIN A1C
Hgb A1c MFr Bld: 5.9 % — ABNORMAL HIGH (ref 4.8–5.6)
Mean Plasma Glucose: 122.63 mg/dL

## 2021-03-25 MED ORDER — METRONIDAZOLE 500 MG/100ML IV SOLN
500.0000 mg | Freq: Once | INTRAVENOUS | Status: AC
  Administered 2021-03-25: 500 mg via INTRAVENOUS
  Filled 2021-03-25: qty 100

## 2021-03-25 MED ORDER — ONDANSETRON HCL 4 MG PO TABS: 4.0000 mg | ORAL_TABLET | Freq: Four times a day (QID) | ORAL | Status: DC | PRN

## 2021-03-25 MED ORDER — LACTATED RINGERS IV BOLUS
1000.0000 mL | Freq: Once | INTRAVENOUS | Status: AC
  Administered 2021-03-25: 1000 mL via INTRAVENOUS

## 2021-03-25 MED ORDER — SODIUM CHLORIDE 0.9 % IV SOLN
2.0000 g | Freq: Two times a day (BID) | INTRAVENOUS | Status: DC
  Administered 2021-03-25 – 2021-03-27 (×5): 2 g via INTRAVENOUS
  Filled 2021-03-25 (×7): qty 2

## 2021-03-25 MED ORDER — SODIUM CHLORIDE 0.9 % IV SOLN
2.0000 g | Freq: Once | INTRAVENOUS | Status: AC
  Administered 2021-03-25: 2 g via INTRAVENOUS
  Filled 2021-03-25: qty 2

## 2021-03-25 MED ORDER — CITALOPRAM HYDROBROMIDE 20 MG PO TABS
40.0000 mg | ORAL_TABLET | Freq: Every day | ORAL | Status: DC
  Administered 2021-03-26 – 2021-03-27 (×2): 40 mg via ORAL
  Filled 2021-03-25 (×2): qty 2

## 2021-03-25 MED ORDER — MONTELUKAST SODIUM 10 MG PO TABS
10.0000 mg | ORAL_TABLET | Freq: Every day | ORAL | Status: DC
  Administered 2021-03-25 – 2021-03-26 (×2): 10 mg via ORAL
  Filled 2021-03-25 (×2): qty 1

## 2021-03-25 MED ORDER — ENOXAPARIN SODIUM 30 MG/0.3ML IJ SOSY: 30.0000 mg | PREFILLED_SYRINGE | INTRAMUSCULAR | Status: DC

## 2021-03-25 MED ORDER — ONDANSETRON HCL 4 MG/2ML IJ SOLN: 4.0000 mg | Freq: Four times a day (QID) | INTRAMUSCULAR | Status: DC | PRN

## 2021-03-25 MED ORDER — LACTATED RINGERS IV SOLN: INTRAVENOUS | Status: DC

## 2021-03-25 MED ORDER — METRONIDAZOLE 500 MG/100ML IV SOLN
500.0000 mg | Freq: Three times a day (TID) | INTRAVENOUS | Status: DC
  Administered 2021-03-25 – 2021-03-27 (×7): 500 mg via INTRAVENOUS
  Filled 2021-03-25 (×11): qty 100

## 2021-03-25 MED ORDER — MORPHINE SULFATE (PF) 2 MG/ML IV SOLN
2.0000 mg | INTRAVENOUS | Status: DC | PRN
  Administered 2021-03-27: 2 mg via INTRAVENOUS
  Filled 2021-03-25: qty 1

## 2021-03-25 MED ORDER — PANTOPRAZOLE SODIUM 40 MG IV SOLR
40.0000 mg | INTRAVENOUS | Status: DC
  Administered 2021-03-25 – 2021-03-27 (×3): 40 mg via INTRAVENOUS
  Filled 2021-03-25 (×3): qty 40

## 2021-03-25 MED ORDER — INSULIN ASPART 100 UNIT/ML IJ SOLN
0.0000 [IU] | Freq: Three times a day (TID) | INTRAMUSCULAR | Status: DC
  Administered 2021-03-26 – 2021-03-27 (×2): 1 [IU] via SUBCUTANEOUS
  Filled 2021-03-25 (×2): qty 1

## 2021-03-25 MED ORDER — SODIUM CHLORIDE 0.9 % IV SOLN
2.0000 g | INTRAVENOUS | Status: DC
  Filled 2021-03-25: qty 2

## 2021-03-25 MED ORDER — ENOXAPARIN SODIUM 40 MG/0.4ML IJ SOSY: 40.0000 mg | PREFILLED_SYRINGE | INTRAMUSCULAR | Status: DC

## 2021-03-25 NOTE — Progress Notes (Signed)
PHARMACY -  BRIEF ANTIBIOTIC NOTE   Pharmacy has received consult(s) for cefepime from an ED provider.  The patient's profile has been reviewed for ht/wt/allergies/indication/available labs.    One time order(s) placed for cefepime 2 grams IV x 1  Further antibiotics/pharmacy consults should be ordered by admitting physician if indicated.                       Thank you, Dallie Piles 03/25/2021  1:30 AM

## 2021-03-25 NOTE — Progress Notes (Signed)
Pharmacy Antibiotic Note  ANNALEA Anderson is a 75 y.o. female w/ PMH of HLD, DM, anxiety, depression admitted on 03/24/2021 with  1 day history of left lower quadrant pain, vomiting blood-tinged diarrhea and CT abdomen and pelvis consistent with diarrheal state and sigmoid colitis. Pharmacy has been consulted for cefepime dosing. She is noted to have a penicillin allergy (rash) but tolerated cefdinir in the past and has received the first dose of cefepime in the ED. Her SCr is notably elevated compared to her baseline level  Plan:   start cefepime 2 grams IV every 24 hours Follow renal function for needed dose adjustments  Height: 5\' 3"  (160 cm) Weight: 72.6 kg (160 lb) IBW/kg (Calculated) : 52.4  Temp (24hrs), Avg:98.4 F (36.9 C), Min:98.2 F (36.8 C), Max:98.5 F (36.9 C)  Recent Labs  Lab 03/24/21 2038 03/25/21 0136  WBC 20.1*  --   CREATININE 1.78*  --   LATICACIDVEN  --  0.9    Estimated Creatinine Clearance: 25.7 mL/min (A) (by C-G formula based on SCr of 1.78 mg/dL (H)).    Allergies  Allergen Reactions   Cyclobenzaprine Other (See Comments) and Rash    It made her vertigo worse.   Penicillins Rash   Adhesive [Tape] Itching    "heavy" white tape   Atorvastatin Other (See Comments)    Severe Sweating    Antimicrobials this admission: cefepime 07/11 >>  metronidazole 07/11 >>   Microbiology results: 07/11 BCx: pending 07/11 C diff: pending 07/11 GI panel pending  Thank you for allowing pharmacy to be a part of this patient's care.  Diane Anderson 03/25/2021 4:00 AM

## 2021-03-25 NOTE — Progress Notes (Addendum)
Pharmacy Antibiotic Note  Diane Anderson is a 75 y.o. female w/ PMH of HLD, DM, anxiety, depression admitted on 03/24/2021 with  1 day history of left lower quadrant pain, vomiting blood-tinged diarrhea and CT abdomen and pelvis consistent with diarrheal state and sigmoid colitis. Pharmacy has been consulted for cefepime dosing. She is noted to have a penicillin allergy (rash) but tolerated cefdinir in the past and has received the first dose of cefepime in the ED. Her SCr is notably elevated (1.78) compared to her baseline level (0.8).   Plan:    Scr 1.78>1.40; will adjust cefepime dose to 2 grams IV every 12 hours Follow renal function for needed dose adjustments Follow up cultures  Height: 5\' 3"  (160 cm) Weight: 72.6 kg (160 lb) IBW/kg (Calculated) : 52.4  Temp (24hrs), Avg:98.6 F (37 C), Min:98.2 F (36.8 C), Max:99.1 F (37.3 C)  Recent Labs  Lab 03/24/21 2038 03/25/21 0136 03/25/21 0359  WBC 20.1*  --  16.1*  CREATININE 1.78*  --  1.40*  LATICACIDVEN  --  0.9  --      Estimated Creatinine Clearance: 32.7 mL/min (A) (by C-G formula based on SCr of 1.4 mg/dL (H)).    Allergies  Allergen Reactions   Cyclobenzaprine Other (See Comments) and Rash    It made her vertigo worse.   Penicillins Rash   Adhesive [Tape] Itching    "heavy" white tape   Atorvastatin Other (See Comments)    Severe Sweating    Antimicrobials this admission: cefepime 07/11 >>  metronidazole 07/11 >>   Dose adjustments this admission: 7/11 cefepime q24h > q12h  Microbiology results: 07/11 BCx: NG<12h 07/11 C diff: pending 07/11 GI panel pending  Thank you for allowing pharmacy to be a part of this patient's care.  Sherilyn Banker, PharmD 03/25/2021 8:51 AM

## 2021-03-25 NOTE — Plan of Care (Signed)

## 2021-03-25 NOTE — Consult Note (Signed)
Jonathon Bellows , MD [address] [place], [place], [place], [id number] [address] [place], [place], [place], [address] Phone: [phone]  Fax: [phone]  Consultation  Referring Provider:     Dr. Damita Dunnings Primary Care Physician:  Sofie Hartigan, MD Primary Gastroenterologist:     none     Reason for Consultation:     Acute colitis  Date of Admission:  03/24/2021 Date of Consultation:  03/25/2021         HPI:   Diane Anderson is a 75 y.o. female presented to the emergency room yesterday with diarrhea and blood per rectum.  She presented 1 day history of left lower quadrant pain and constipation followed by diarrhea and passing blood per rectum which occurred on multiple occasions yesterday , no bleeding today . Had been taking 2 ibuprofens daily for a few months. Did have some crampy Left lower sided abdominal pain. She has never had a colonoscopy, no change in bowel habits. Some intentional weight loss.  She came into the ER she had significant leukocytosis acute kidney injury and CT scan of the abdomen showed fluid throughout the colon consistent with a diarrheal state and mild pericolonic inflammatory change in the sigmoid colon seen with colitis.  No acute diverticulitis noted.  Started on IV antibiotics.  On admission hemoglobin was 15.2 g.  This morning hemoglobin is 13.8 g.  Acute kidney injury improving.     Past Medical History:  Diagnosis Date   Allergy    Seasonal    Anxiety    Arthritis    left knee, left shoulder   Asthma    Depression    Diabetes mellitus without complication ([place])    diet controlled after weight loss   Discharge from left nipple 01/28/2017   Fibrocystic breast disease    GERD (gastroesophageal reflux disease)    Hx of migraines    Hypercholesteremia    Hyperlipidemia    Vertigo    avg 1x/mo   Vitamin D deficiency    Wears dentures    full upper    Past Surgical History:  Procedure Laterality Date   BREAST BIOPSY Right 2011    stereo bx benign   BREAST BIOPSY Left 04/18/2016   x3. 3:00 2 cmfn benign hyalinizing stroma cyst formation, 3:00 5cmfn benign ductal ectasia, 10:00 intraductal papilloma.   BREAST EXCISIONAL BIOPSY Right yrs ago    benign   BREAST EXCISIONAL BIOPSY Left 05/15/2016   excision to remove papilloma at 10:00 location   CATARACT EXTRACTION W/PHACO Left 08/10/2018   Procedure: CATARACT EXTRACTION PHACO AND INTRAOCULAR LENS PLACEMENT ([place])  LEFT DIABETIC;  Surgeon: Leandrew Koyanagi, MD;  Location: [facility];  Service: Ophthalmology;  Laterality: Left;  Diabetic - diet controlled   CATARACT EXTRACTION W/PHACO Right 08/31/2018   Procedure: CATARACT EXTRACTION PHACO AND INTRAOCULAR LENS PLACEMENT ([place])  RIGHT DIABETIC;  Surgeon: Leandrew Koyanagi, MD;  Location: [facility];  Service: Ophthalmology;  Laterality: Right;  Diabetic - diet controlled   CYST EXCISION     Dr. Pat Patrick   ETHMOIDECTOMY Right 08/04/2019   Procedure: ETHMOIDECTOMY WITH FRONTAL SINUOTOMY;  Surgeon: Margaretha Sheffield, MD;  Location: [facility];  Service: ENT;  Laterality: Right;  Diabetic - diet controlled   EXCISION OF BREAST BIOPSY Left 05/15/2016   Procedure: EXCISION OF BREAST BIOPSY;  Surgeon: Hubbard Robinson, MD;  Location: [facility];  Service: General;  Laterality: Left;   IMAGE GUIDED SINUS SURGERY Right 08/04/2019  Procedure: IMAGE GUIDED SINUS SURGERY;  Surgeon: Margaretha Sheffield, MD;  Location: [facility];  Service: ENT;  Laterality: Right;  disk on OR charge nurse desk 11-16 [place]   Right Shoulder   LYMPH GLAND EXCISION Right 2011   NASAL SEPTOPLASTY W/ TURBINOPLASTY Bilateral 08/04/2019   Procedure: NASAL SEPTOPLASTY WITH TURBINATE REDUCTION;  Surgeon: Margaretha Sheffield, MD;  Location: [facility];  Service: ENT;  Laterality: Bilateral;   TONSILLECTOMY     TUBAL LIGATION      Prior to Admission medications   Medication Sig Start Date End Date Taking?  Authorizing Provider  ACETAMINOPHEN PO Take by mouth as needed. Patient not taking: Reported on 03/25/2021    [provider]  citalopram (CELEXA) 40 MG tablet Take 1 tablet by mouth daily. Patient not taking: Reported on 03/25/2021 02/04/16   [provider]  fluticasone (FLONASE) 50 MCG/ACT nasal spray Place 2 sprays into the nose daily as needed.  01/24/16   [provider]  lisinopril (PRINIVIL,ZESTRIL) 2.5 MG tablet Take 1 tablet by mouth daily. 04/04/16   [provider]  meclizine (ANTIVERT) 25 MG tablet Take 25 mg by mouth 3 (three) times daily as needed. 10/23/20   [provider]  montelukast (SINGULAIR) 10 MG tablet Take 1 tablet by mouth at bedtime. 04/06/17 07/27/19  [provider]  nystatin cream (MYCOSTATIN) Apply topically. 07/10/20 07/10/21  [provider]  omeprazole (PRILOSEC) 20 MG capsule Take 1 capsule by mouth daily as needed.  02/04/16   [provider]  ondansetron (ZOFRAN ODT) 4 MG disintegrating tablet Take 1 tablet (4 mg total) by mouth every 6 (six) hours as needed for nausea or vomiting. Patient not taking: Reported on 03/25/2021 05/09/19   Delman Kitten, MD  predniSONE (DELTASONE) 10 MG tablet Start with 3 pills tomorrow. Taper over the next 6 days.  3,3,2,2,1,1. Patient not taking: Reported on 03/25/2021 08/04/19   Margaretha Sheffield, MD  rosuvastatin (CRESTOR) 20 MG tablet Take 20 mg by mouth daily. 03/04/18   [provider]  Vitamin D, Ergocalciferol, (DRISDOL) 50000 units CAPS capsule Take 50,000 Units by mouth every 7 (seven) days.    [provider]    Family History  Problem Relation Age of Onset   Dementia Mother    Cancer Mother        skin   Osteoporosis Mother    Alzheimer's disease Father    Asthma Father    Hypertension Father    Breast cancer Maternal Aunt 86   Cancer Maternal Aunt        Breast Cancer   Hypertension Maternal Aunt    Birth defects Cousin 47        Breast   Breast cancer Cousin    Breast cancer Cousin    Hypertension Sister    Diabetes Paternal Grandmother    Breast cancer Maternal Grandfather      Social History   Tobacco Use   Smoking status: Every Day    Packs/day: 1.00    Years: 52.00    Pack years: 52.00    Types: Cigarettes   Smokeless tobacco: Never  Vaping Use   Vaping Use: Never used  Substance Use Topics   Alcohol use: No    Alcohol/week: 0.0 standard drinks    Comment: rarely   Drug use: No    Allergies as of 03/24/2021 - Review Complete 03/24/2021  Allergen Reaction Noted   Cyclobenzaprine Other (See Comments) and Rash  02/10/2019   Penicillins Rash 11/28/2013   Adhesive [tape] Itching 08/04/2018   Atorvastatin Other (See Comments) 08/27/2015    Review of Systems:    All systems reviewed and negative except where noted in HPI.   Physical Exam:  Vital signs in last 24 hours: Temp:  [98.2 F (36.8 C)-99.1 F (37.3 C)] 99.1 F (37.3 C) (07/11 0441) Pulse Rate:  [63-83] 67 (07/11 0441) Resp:  [16-20] 20 (07/11 0441) BP: (114-184)/(44-76) 164/50 (07/11 0441) SpO2:  [90 %-97 %] 97 % (07/11 0441) Weight:  [72.6 kg] 72.6 kg (07/10 2033) Last BM Date: 03/24/21 General:   Pleasant, cooperative in NAD Head:  Normocephalic and atraumatic. Eyes:   No icterus.   Conjunctiva pink. PERRLA. Ears:  Normal auditory acuity. Neck:  Supple; no masses or thyroidomegaly Lungs: Respirations even and unlabored. Lungs clear to auscultation bilaterally.   No wheezes, crackles, or rhonchi.  Heart:  Regular rate and rhythm;  Without murmur, clicks, rubs or gallops Abdomen:  Soft, nondistended, nontender. Normal bowel sounds. No appreciable masses or hepatomegaly.  No rebound or guarding.  Neurologic:  Alert and oriented x3;  grossly normal neurologically. Skin:  Intact without significant lesions or rashes. Cervical Nodes:  No significant cervical adenopathy. Psych:  Alert and cooperative. Normal affect.  LAB  RESULTS: Recent Labs    03/24/21 2038 03/25/21 0359  WBC 20.1* 16.1*  HGB 15.2* 13.8  HCT 44.1 39.8  PLT 335 301   BMET Recent Labs    03/24/21 2038 03/25/21 0359  NA 129* 129*  K 4.2 3.5  CL 97* 98  CO2 22 24  GLUCOSE 109* 115*  BUN 25* 29*  CREATININE 1.78* 1.40*  CALCIUM 9.4 8.6*   LFT Recent Labs    03/24/21 2038  PROT 7.1  ALBUMIN 4.1  AST 23  ALT 14  ALKPHOS 66  BILITOT 0.4   PT/INR No results for input(s): LABPROT, INR in the last 72 hours.  STUDIES: CT ABDOMEN PELVIS WO CONTRAST  Addendum Date: 03/25/2021   ADDENDUM REPORT: 03/25/2021 01:53 ADDENDUM: It should be noted the aorta shows no findings of hemorrhage or impending rupture. These findings were discussed with the referring clinician. Electronically Signed   By: Inez Catalina M.D.   On: 03/25/2021 01:53   Result Date: 03/25/2021 CLINICAL DATA:  GI bleeding and left lower quadrant pain EXAM: CT ABDOMEN AND PELVIS WITHOUT CONTRAST TECHNIQUE: Multidetector CT imaging of the abdomen and pelvis was performed following the standard protocol without IV contrast. COMPARISON:  07/05/2020 FINDINGS: Lower chest: No acute abnormality. Hepatobiliary: No focal liver abnormality is seen. No gallstones, gallbladder wall thickening, or biliary dilatation. Pancreas: Unremarkable. No pancreatic ductal dilatation or surrounding inflammatory changes. Spleen: Normal in size without focal abnormality. Adrenals/Urinary Tract: Stable left adrenal lesion is noted incompletely evaluated on this exam. The right adrenal gland is within normal limits. Kidneys are well visualize without renal calculi or obstructive changes. Cystic appearing lesion is noted in the upper pole of the right kidney stable from the prior study. No obstructive changes are seen. The bladder is partially distended. Stomach/Bowel: Fluid is noted throughout the colon consistent with the diarrheal state. Mild pericolonic inflammatory changes noted in the sigmoid likely  related to colitis. No perforation is noted. No obstructive changes are noted. Cecum is mobile in the mid abdomen stable from the prior exam. The appendix is not well seen. No inflammatory changes are noted. Small bowel and stomach appear within normal limits. Vascular/Lymphatic: Atherosclerotic changes are noted with dilatation  of the infrarenal aorta to 4.1 cm. This is an increase of 3 mm when compared with the prior exam. Tapering distally is noted. No significant lymphadenopathy is seen. Reproductive: Uterus and bilateral adnexa are unremarkable. Other: No abdominal wall hernia or abnormality. No abdominopelvic ascites. Musculoskeletal: Degenerative changes of lumbar spine are noted. IMPRESSION: Fluid throughout the colon consistent with the diarrheal state. Mild pericolonic inflammatory change in the sigmoid is seen consistent with colitis. No diverticulitis is seen. Stable left adrenal lesion. Stable cystic lesion in the right kidney. Dilatation of the abdominal aorta to 4.1 cm. This is a slight increased from the prior exam at 3.8 cm. Recommend follow-up every 12 months and vascular consultation. This recommendation follows ACR consensus guidelines: White Paper of the ACR Incidental Findings Committee II on Vascular Findings. J Am Coll Radiol 2013; 10:789-794. Electronically Signed: By: Inez Catalina M.D. On: 03/25/2021 00:35      Impression / Plan:   Diane Anderson is a 75 y.o. y/o female presented to the emergency room earlier today with acute diarrhea with rectal bleeding.  Hemoglobin is in the normal range.  Also presented with AKI.  CT scan of the abdomen shows mild inflammation in the sigmoid colon consistent with colitis.  The history and presentation is very suggestive of acute infectious colitis vs NSAID related colitis . Appears she has stopped bleeding now .  In view of age and significant leukocytosis agree with commencement of antibiotics.  Plan 1.  Monitor CBC and transfuse as  needed. 2.  Continue IV fluids, antibiotics.  Check stool for GI PCR and C. difficile. 3.  Suggest to continue conservative management if does not improve in 24 to 48 hours then can consider flexible sigmoidoscopy at that point of time.  I would expect it to resolve most likely by then.If resolves can plan for outpatient  vs inpatient colonoscopy since she has never had one and suffers from constipation     Thank you for involving me in the care of this patient.      LOS: 0 days   Jonathon Bellows, MD  03/25/2021, 10:17 AM

## 2021-03-25 NOTE — H&P (Signed)
History and Physical    Diane Anderson:[id number] DOB: January 05, 1946 DOA: 03/24/2021  PCP: Sofie Hartigan, MD   Patient coming from: Home  I have personally briefly reviewed patient's old medical records in [place]  Chief Complaint: Diarrhea ,blood per rectum  HPI: Diane Anderson is a 75 y.o. female with medical history significant for Diabetes, osteoarthritis, GERD and AAA who presents to the ED with a 1 day history of left lower quadrant pain, vomiting , constipation followed by diarrhea and passing blood per rectum  She denies fever and chills.  She has had no cough or shortness of breath and no fever.  Abdominal pain is crampy and of moderate intensity and nonradiating  ED course: On arrival afebrile with BP 124/59, pulse 83 with O2 sat 96% on room air.  Blood work significant for leukocytosis of 20,000 with hemoglobin 15.2, creatinine 1.78 up from baseline of 0.78.  Sodium 129, close to baseline around 131 lactic acid 0.9, troponin 12.  COVID and flu negative.  EKG, personally viewed and interpreted sinus at 86 with no acute ST-T wave changes  Imaging: CT abdomen and pelvis shows fluid throughout the colon consistent with a diarrheal state mild pericolonic inflammatory change in the sigmoid is seen consistent with colitis.  No diverticulitis seen.  Aorta shows no findings of hemorrhage or impending rupture.  Infrarenal aorta 4.1 cm.   Patient treated with cefepime and Flagyl.  Hospitalist consulted for admission.  Review of Systems: As per HPI otherwise all other systems on review of systems negative.    Past Medical History:  Diagnosis Date   Allergy    Seasonal    Anxiety    Arthritis    left knee, left shoulder   Asthma    Depression    Diabetes mellitus without complication ([place])    diet controlled after weight loss   Discharge from left nipple 01/28/2017   Fibrocystic breast disease    GERD (gastroesophageal reflux disease)    Hx of migraines     Hypercholesteremia    Hyperlipidemia    Vertigo    avg 1x/mo   Vitamin D deficiency    Wears dentures    full upper    Past Surgical History:  Procedure Laterality Date   BREAST BIOPSY Right 2011   stereo bx benign   BREAST BIOPSY Left 04/18/2016   x3. 3:00 2 cmfn benign hyalinizing stroma cyst formation, 3:00 5cmfn benign ductal ectasia, 10:00 intraductal papilloma.   BREAST EXCISIONAL BIOPSY Right yrs ago    benign   BREAST EXCISIONAL BIOPSY Left 05/15/2016   excision to remove papilloma at 10:00 location   CATARACT EXTRACTION W/PHACO Left 08/10/2018   Procedure: CATARACT EXTRACTION PHACO AND INTRAOCULAR LENS PLACEMENT ([place])  LEFT DIABETIC;  Surgeon: Leandrew Koyanagi, MD;  Location: [facility];  Service: Ophthalmology;  Laterality: Left;  Diabetic - diet controlled   CATARACT EXTRACTION W/PHACO Right 08/31/2018   Procedure: CATARACT EXTRACTION PHACO AND INTRAOCULAR LENS PLACEMENT ([place])  RIGHT DIABETIC;  Surgeon: Leandrew Koyanagi, MD;  Location: [facility];  Service: Ophthalmology;  Laterality: Right;  Diabetic - diet controlled   CYST EXCISION     Dr. Pat Patrick   ETHMOIDECTOMY Right 08/04/2019   Procedure: ETHMOIDECTOMY WITH FRONTAL SINUOTOMY;  Surgeon: Margaretha Sheffield, MD;  Location: [facility];  Service: ENT;  Laterality: Right;  Diabetic - diet controlled   EXCISION OF BREAST BIOPSY Left 05/15/2016   Procedure: EXCISION OF BREAST BIOPSY;  Surgeon: Lavona Mound  Loflin, MD;  Location: [facility];  Service: General;  Laterality: Left;   IMAGE GUIDED SINUS SURGERY Right 08/04/2019   Procedure: IMAGE GUIDED SINUS SURGERY;  Surgeon: Margaretha Sheffield, MD;  Location: [facility];  Service: ENT;  Laterality: Right;  disk on OR charge nurse desk 11-16 [place]   Right Shoulder   LYMPH GLAND EXCISION Right 2011   NASAL SEPTOPLASTY W/ TURBINOPLASTY Bilateral 08/04/2019   Procedure: NASAL SEPTOPLASTY WITH TURBINATE REDUCTION;  Surgeon:  Margaretha Sheffield, MD;  Location: [facility];  Service: ENT;  Laterality: Bilateral;   TONSILLECTOMY     TUBAL LIGATION       reports that she has been smoking cigarettes. She has a 52.00 pack-year smoking history. She has never used smokeless tobacco. She reports that she does not drink alcohol and does not use drugs.  Allergies  Allergen Reactions   Cyclobenzaprine Other (See Comments) and Rash    It made her vertigo worse.   Penicillins Rash   Adhesive [Tape] Itching    "heavy" white tape   Atorvastatin Other (See Comments)    Severe Sweating    Family History  Problem Relation Age of Onset   Dementia Mother    Cancer Mother        skin   Osteoporosis Mother    Alzheimer's disease Father    Asthma Father    Hypertension Father    Breast cancer Maternal Aunt 46   Cancer Maternal Aunt        Breast Cancer   Hypertension Maternal Aunt    Birth defects Cousin 40       Breast   Breast cancer Cousin    Breast cancer Cousin    Hypertension Sister    Diabetes Paternal Grandmother    Breast cancer Maternal Grandfather       Prior to Admission medications   Medication Sig Start Date End Date Taking? Authorizing Provider  ACETAMINOPHEN PO Take by mouth as needed.    [provider]  citalopram (CELEXA) 40 MG tablet Take 1 tablet by mouth daily. 02/04/16   [provider]  fluticasone (FLONASE) 50 MCG/ACT nasal spray Place 2 sprays into the nose daily as needed.  01/24/16   [provider]  lisinopril (PRINIVIL,ZESTRIL) 2.5 MG tablet Take 1 tablet by mouth daily. 04/04/16   [provider]  meclizine (ANTIVERT) 25 MG tablet Take 25 mg by mouth 3 (three) times daily as needed. 10/23/20   [provider]  montelukast (SINGULAIR) 10 MG tablet Take 1 tablet by mouth at bedtime. 04/06/17 07/27/19  [provider]  nystatin cream (MYCOSTATIN) Apply topically. 07/10/20 07/10/21  [provider]  omeprazole (PRILOSEC) 20  MG capsule Take 1 capsule by mouth daily as needed.  02/04/16   [provider]  ondansetron (ZOFRAN ODT) 4 MG disintegrating tablet Take 1 tablet (4 mg total) by mouth every 6 (six) hours as needed for nausea or vomiting. 05/09/19   Delman Kitten, MD  predniSONE (DELTASONE) 10 MG tablet Start with 3 pills tomorrow. Taper over the next 6 days.  3,3,2,2,1,1. 08/04/19   Margaretha Sheffield, MD  rosuvastatin (CRESTOR) 20 MG tablet Take 20 mg by mouth daily. 03/04/18   [provider]  Vitamin D, Ergocalciferol, (DRISDOL) 50000 units CAPS capsule Take 50,000 Units by mouth every 7 (seven) days.    [provider]    Physical Exam: Vitals:   03/25/21 0100 03/25/21 0150 03/25/21 0200 03/25/21 0230  BP: (!) 149/66 (!) 115/53 (!) 121/45 (!) 119/44  Pulse: 72 68 74 66  Resp: 16 16 16 16   Temp:      TempSrc:      SpO2: 96% 90% 92% 93%  Weight:      Height:         Vitals:   03/25/21 0100 03/25/21 0150 03/25/21 0200 03/25/21 0230  BP: (!) 149/66 (!) 115/53 (!) 121/45 (!) 119/44  Pulse: 72 68 74 66  Resp: 16 16 16 16   Temp:      TempSrc:      SpO2: 96% 90% 92% 93%  Weight:      Height:          Constitutional: Alert and oriented x 3 . Not in any apparent distress HEENT:      Head: Normocephalic and atraumatic.         Eyes: PERLA, EOMI, Conjunctivae are normal. Sclera is non-icteric.       Mouth/Throat: Mucous membranes are moist.       Neck: Supple with no signs of meningismus. Cardiovascular: Regular rate and rhythm. No murmurs, gallops, or rubs. 2+ symmetrical distal pulses are present . No JVD. No LE edema Respiratory: Respiratory effort normal .Lungs sounds clear bilaterally. No wheezes, crackles, or rhonchi.  Gastrointestinal: Soft, mild tendernes in LLQ, and non distended with positive bowel sounds.  Genitourinary: No CVA tenderness. Musculoskeletal: Nontender with normal range of motion in all extremities. No cyanosis, or erythema of extremities. Neurologic:   Face is symmetric. Moving all extremities. No gross focal neurologic deficits . Skin: Skin is warm, dry.  No rash or ulcers Psychiatric: Mood and affect are normal    Labs on Admission: I have personally reviewed following labs and imaging studies  CBC: Recent Labs  Lab 03/24/21 2038  WBC 20.1*  HGB 15.2*  HCT 44.1  MCV 85.3  PLT 607   Basic Metabolic Panel: Recent Labs  Lab 03/24/21 2038  NA 129*  K 4.2  CL 97*  CO2 22  GLUCOSE 109*  BUN 25*  CREATININE 1.78*  CALCIUM 9.4   GFR: Estimated Creatinine Clearance: 25.7 mL/min (A) (by C-G formula based on SCr of 1.78 mg/dL (H)). Liver Function Tests: Recent Labs  Lab 03/24/21 2038  AST 23  ALT 14  ALKPHOS 66  BILITOT 0.4  PROT 7.1  ALBUMIN 4.1   No results for input(s): LIPASE, AMYLASE in the last 168 hours. No results for input(s): AMMONIA in the last 168 hours. Coagulation Profile: No results for input(s): INR, PROTIME in the last 168 hours. Cardiac Enzymes: No results for input(s): CKTOTAL, CKMB, CKMBINDEX, TROPONINI in the last 168 hours. BNP (last 3 results) No results for input(s): PROBNP in the last 8760 hours. HbA1C: No results for input(s): HGBA1C in the last 72 hours. CBG: No results for input(s): GLUCAP in the last 168 hours. Lipid Profile: No results for input(s): CHOL, HDL, LDLCALC, TRIG, CHOLHDL, LDLDIRECT in the last 72 hours. Thyroid Function Tests: No results for input(s): TSH, T4TOTAL, FREET4, T3FREE, THYROIDAB in the last 72 hours. Anemia Panel: No results for input(s): VITAMINB12, FOLATE, FERRITIN, TIBC, IRON, RETICCTPCT in the last 72 hours. Urine analysis:    Component Value Date/Time   COLORURINE YELLOW (A) 07/05/2020 2112   APPEARANCEUR CLEAR (A) 07/05/2020 2112   LABSPEC >1.046 (H) 07/05/2020 2112   PHURINE 6.0 07/05/2020 2112   GLUCOSEU NEGATIVE 07/05/2020 2112   HGBUR NEGATIVE 07/05/2020 2112   BILIRUBINUR NEGATIVE 07/05/2020 2112   Benjamin Stain  NEGATIVE 07/05/2020 2112    PROTEINUR NEGATIVE 07/05/2020 2112   NITRITE NEGATIVE 07/05/2020 2112   LEUKOCYTESUR TRACE (A) 07/05/2020 2112    Radiological Exams on Admission: CT ABDOMEN PELVIS WO CONTRAST  Addendum Date: 03/25/2021   ADDENDUM REPORT: 03/25/2021 01:53 ADDENDUM: It should be noted the aorta shows no findings of hemorrhage or impending rupture. These findings were discussed with the referring clinician. Electronically Signed   By: Inez Catalina M.D.   On: 03/25/2021 01:53   Result Date: 03/25/2021 CLINICAL DATA:  GI bleeding and left lower quadrant pain EXAM: CT ABDOMEN AND PELVIS WITHOUT CONTRAST TECHNIQUE: Multidetector CT imaging of the abdomen and pelvis was performed following the standard protocol without IV contrast. COMPARISON:  07/05/2020 FINDINGS: Lower chest: No acute abnormality. Hepatobiliary: No focal liver abnormality is seen. No gallstones, gallbladder wall thickening, or biliary dilatation. Pancreas: Unremarkable. No pancreatic ductal dilatation or surrounding inflammatory changes. Spleen: Normal in size without focal abnormality. Adrenals/Urinary Tract: Stable left adrenal lesion is noted incompletely evaluated on this exam. The right adrenal gland is within normal limits. Kidneys are well visualize without renal calculi or obstructive changes. Cystic appearing lesion is noted in the upper pole of the right kidney stable from the prior study. No obstructive changes are seen. The bladder is partially distended. Stomach/Bowel: Fluid is noted throughout the colon consistent with the diarrheal state. Mild pericolonic inflammatory changes noted in the sigmoid likely related to colitis. No perforation is noted. No obstructive changes are noted. Cecum is mobile in the mid abdomen stable from the prior exam. The appendix is not well seen. No inflammatory changes are noted. Small bowel and stomach appear within normal limits. Vascular/Lymphatic: Atherosclerotic changes are noted with dilatation of the infrarenal  aorta to 4.1 cm. This is an increase of 3 mm when compared with the prior exam. Tapering distally is noted. No significant lymphadenopathy is seen. Reproductive: Uterus and bilateral adnexa are unremarkable. Other: No abdominal wall hernia or abnormality. No abdominopelvic ascites. Musculoskeletal: Degenerative changes of lumbar spine are noted. IMPRESSION: Fluid throughout the colon consistent with the diarrheal state. Mild pericolonic inflammatory change in the sigmoid is seen consistent with colitis. No diverticulitis is seen. Stable left adrenal lesion. Stable cystic lesion in the right kidney. Dilatation of the abdominal aorta to 4.1 cm. This is a slight increased from the prior exam at 3.8 cm. Recommend follow-up every 12 months and vascular consultation. This recommendation follows ACR consensus guidelines: White Paper of the ACR Incidental Findings Committee II on Vascular Findings. J Am Coll Radiol 2013; 10:789-794. Electronically Signed: By: Inez Catalina M.D. On: 03/25/2021 00:35     Assessment/Plan 75 year old female with history of diabetes, osteoarthritis, GERD and AAA who presents to the ED with a 1 day history of left lower quadrant pain, vomiting blood-tinged diarrhea.      Acute colitis   Hematochezia -Patient presenting with abdominal pain , diarrhea as well as blood per rectum without stool -WBC 20,000 with normal lactic acid.   - CT abdomen and pelvis consistent with diarrheal state and sigmoid colitis - Continue cefepime and Flagyl - Clear liquid diet - IV hydration - IV antiemetics and IV pain control - Serial H&H -GI consult    AKI (acute kidney injury) ([place]) - Creatinine 1.78 up from baseline of 0.8 - IV hydration and monitor renal function    Type 2 diabetes mellitus (HCC) - Sliding scale insulin coverage    AAA (abdominal aortic aneurysm) without rupture ([facility]) - CT abdomen and pelvis  showed infrarenal aorta 4.1 cm without hemorrhage or impending rupture    DVT  prophylaxis: SCDs Code Status: full code  Family Communication:  none  Disposition Plan: Back to previous home environment Consults called: GI  Status:At the time of admission, it appears that the appropriate admission status for this patient is INPATIENT. This is judged to be reasonable and necessary in order to provide the required intensity of service to ensure the patient's safety given the presenting symptoms, physical exam findings, and initial radiographic and laboratory data in the context of their  Comorbid conditions.   Patient requires inpatient status due to high intensity of service, high risk for further deterioration and high frequency of surveillance required.   I certify that at the point of admission it is my clinical judgment that the patient will require inpatient hospital care spanning beyond [place] MD [facility]     03/25/2021, 3:31 AM

## 2021-03-25 NOTE — Progress Notes (Signed)
Patient seen and examined personally, I reviewed the chart, history and physical and admission note, done by admitting physician this morning and agree with the same with following addendum.  Please refer to the morning admission note for more detailed plan of care.  Briefly,   Briefly 75 year old female with diabetes osteoarthritis, GERD, AAA presented with left lower quadrant pain, vomiting, constipation followed by diarrhea and passing blood per rectum. Patient seen in the ED found to have leukocytosis 20,000 creatinine 1.7 baseline 0.7 previously, hyponatremia 129 COVID-19 negative CT abdomen consistent with diarrheal state, mild pericolonic inflammatory changes in the sigmoid consistent with colitis, infrarenal aorta 4.1 cm.  Patient was placed on cefepime and Flagyl and admitted for further management  On exam thsi am- c/o LLQ abd pain and rectal bleeding No vomiting no fever. Never had colonoscopy.  Issues being addressed  Acute colitis Hematochezia Leukocytosis Presents with abdomen pain blood per rectum diarrhea leukocytosis, CT abdomen reviewed.  Continue empiric cefepime Flagyl liquid diet-iv hydration , serial H&H.  Add iv pain meds for pain, GI has been consulted  AKI Hyponatremia Ddehydration Keep on IV fluids  Type 2 diabetes mellitus:  ads ssi AAA- OP f/u

## 2021-03-26 LAB — CBC
HCT: 37.8 % (ref 36.0–46.0)
Hemoglobin: 13.3 g/dL (ref 12.0–15.0)
MCH: 30.2 pg (ref 26.0–34.0)
MCHC: 35.2 g/dL (ref 30.0–36.0)
MCV: 85.7 fL (ref 80.0–100.0)
Platelets: 267 10*3/uL (ref 150–400)
RBC: 4.41 MIL/uL (ref 3.87–5.11)
RDW: 13.2 % (ref 11.5–15.5)
WBC: 12.4 10*3/uL — ABNORMAL HIGH (ref 4.0–10.5)
nRBC: 0 % (ref 0.0–0.2)

## 2021-03-26 LAB — BASIC METABOLIC PANEL
Anion gap: 4 — ABNORMAL LOW (ref 5–15)
BUN: 15 mg/dL (ref 8–23)
CO2: 25 mmol/L (ref 22–32)
Calcium: 9 mg/dL (ref 8.9–10.3)
Chloride: 102 mmol/L (ref 98–111)
Creatinine, Ser: 0.7 mg/dL (ref 0.44–1.00)
GFR, Estimated: >60 mL/min (ref >60)
Glucose, Bld: 99 mg/dL (ref 70–99)
Potassium: 4 mmol/L (ref 3.5–5.1)
Sodium: 131 mmol/L — ABNORMAL LOW (ref 135–145)

## 2021-03-26 LAB — GLUCOSE, CAPILLARY
Glucose-Capillary: 103 mg/dL — ABNORMAL HIGH (ref 70–99)
Glucose-Capillary: 123 mg/dL — ABNORMAL HIGH (ref 70–99)
Glucose-Capillary: 102 mg/dL — ABNORMAL HIGH (ref 70–99)
Glucose-Capillary: 101 mg/dL — ABNORMAL HIGH (ref 70–99)

## 2021-03-26 LAB — HEMOGLOBIN AND HEMATOCRIT, BLOOD
HCT: 40 % (ref 36.0–46.0)
Hemoglobin: 13.7 g/dL (ref 12.0–15.0)

## 2021-03-26 MED ORDER — ACETAMINOPHEN 650 MG RE SUPP: 650.0000 mg | Freq: Four times a day (QID) | RECTAL | Status: DC | PRN

## 2021-03-26 MED ORDER — DOCUSATE SODIUM 100 MG PO CAPS
100.0000 mg | ORAL_CAPSULE | Freq: Two times a day (BID) | ORAL | Status: DC
  Administered 2021-03-26 – 2021-03-27 (×3): 100 mg via ORAL
  Filled 2021-03-26 (×3): qty 1

## 2021-03-26 MED ORDER — ACETAMINOPHEN 325 MG PO TABS: 650.0000 mg | ORAL_TABLET | Freq: Four times a day (QID) | ORAL | Status: DC | PRN

## 2021-03-26 NOTE — Progress Notes (Signed)
Diane Anderson , MD [address] [place], [place], [place], [address] [address], [place], [place], [place], [address] Phone: [phone]  Fax: [phone]   Diane Anderson is being followed for acute colitis  Day 1 of follow up   Subjective: Feels muchbetter, eating and drinking , some LLQ cramping , not had a bowelmovement today .    Objective: Vital signs in last 24 hours: Vitals:   03/25/21 1516 03/25/21 2139 03/26/21 0451 03/26/21 0819  BP: (!) 132/56 (!) 147/53 (!) 144/54 140/64  Pulse: 61 64 (!) 59 62  Resp: 18 20 20 18   Temp: 98.8 F (37.1 C) 99.2 F (37.3 C) 98.6 F (37 C) 98.9 F (37.2 C)  TempSrc: Oral Oral Oral   SpO2: 96% 96% 97% 97%  Weight:      Height:       Weight change:   Intake/Output Summary (Last 24 hours) at 03/26/2021 0912 Last data filed at 03/25/2021 2209 Gross per 24 hour  Intake 910.03 ml  Output 0 ml  Net 910.03 ml     Exam:  Abdomen: soft, nontender, normal bowel sounds   Lab Results: @LABTEST2 @ Micro Results: Recent Results (from the past 240 hour(s))  Resp Panel by RT-PCR (Flu A&B, Covid) Nasopharyngeal Swab     Status: None   Collection Time: 03/25/21  1:36 AM   Specimen: Nasopharyngeal Swab; Nasopharyngeal(NP) swabs in vial transport medium  Result Value Ref Range Status   SARS Coronavirus 2 by RT PCR NEGATIVE NEGATIVE Final    Comment: (NOTE) SARS-CoV-2 target nucleic acids are NOT DETECTED.  The SARS-CoV-2 RNA is generally detectable in upper respiratory specimens during the acute phase of infection. The lowest concentration of SARS-CoV-2 viral copies this assay can detect is 138 copies/mL. A negative result does not preclude SARS-Cov-2 infection and should not be used as the sole basis for treatment or other patient management decisions. A negative result may occur with  improper specimen collection/handling, submission of specimen other than nasopharyngeal swab, presence of viral mutation(s) within  the areas targeted by this assay, and inadequate number of viral copies(<138 copies/mL). A negative result must be combined with clinical observations, patient history, and epidemiological information. The expected result is Negative.  Fact Sheet for Patients:  EntrepreneurPulse.com.au  Fact Sheet for Healthcare Providers:  IncredibleEmployment.be  This test is no t yet approved or cleared by the [place] FDA and  has been authorized for detection and/or diagnosis of SARS-CoV-2 by FDA under an Emergency Use Authorization (EUA). This EUA will remain  in effect (meaning this test can be used) for the duration of the COVID-19 declaration under Section 564(b)(1) of the Act, 21 U.S.C.section 360bbb-3(b)(1), unless the authorization is terminated  or revoked sooner.       Influenza A by PCR NEGATIVE NEGATIVE Final   Influenza B by PCR NEGATIVE NEGATIVE Final    Comment: (NOTE) The Xpert Xpress SARS-CoV-2/FLU/RSV plus assay is intended as an aid in the diagnosis of influenza from Nasopharyngeal swab specimens and should not be used as a sole basis for treatment. Nasal washings and aspirates are unacceptable for Xpert Xpress SARS-CoV-2/FLU/RSV testing.  Fact Sheet for Patients: EntrepreneurPulse.com.au  Fact Sheet for Healthcare Providers: IncredibleEmployment.be  This test is not yet approved or cleared by the [place] FDA and has been authorized for detection and/or diagnosis of SARS-CoV-2 by FDA under an Emergency Use Authorization (EUA). This EUA will remain in effect (meaning this test can be used) for the duration  of the COVID-19 declaration under Section 564(b)(1) of the Act, 21 U.S.C. section 360bbb-3(b)(1), unless the authorization is terminated or revoked.  Performed at [facility], [address] [place], [place] [address]   Blood culture (routine x 2)     Status: None  (Preliminary result)   Collection Time: 03/25/21  1:36 AM   Specimen: BLOOD  Result Value Ref Range Status   Specimen Description BLOOD RIGHT ANTECUBITAL  Final   Special Requests   Final    BOTTLES DRAWN AEROBIC AND ANAEROBIC Blood Culture adequate volume   Culture   Final    NO GROWTH 1 DAY Performed at [facility], [address] [place], [place] [address]    Report Status PENDING  Incomplete  Blood culture (routine x 2)     Status: None (Preliminary result)   Collection Time: 03/25/21  1:37 AM   Specimen: BLOOD  Result Value Ref Range Status   Specimen Description BLOOD BLOOD LEFT HAND  Final   Special Requests   Final    BOTTLES DRAWN AEROBIC AND ANAEROBIC Blood Culture results may not be optimal due to an inadequate volume of blood received in culture bottles   Culture   Final    NO GROWTH 1 DAY Performed at [facility], [address] [place], [place] [address]    Report Status PENDING  Incomplete   Studies/Results: CT ABDOMEN PELVIS WO CONTRAST  Addendum Date: 03/25/2021   ADDENDUM REPORT: 03/25/2021 01:53 ADDENDUM: It should be noted the aorta shows no findings of hemorrhage or impending rupture. These findings were discussed with the referring clinician. Electronically Signed   By: Inez Catalina M.D.   On: 03/25/2021 01:53   Result Date: 03/25/2021 CLINICAL DATA:  GI bleeding and left lower quadrant pain EXAM: CT ABDOMEN AND PELVIS WITHOUT CONTRAST TECHNIQUE: Multidetector CT imaging of the abdomen and pelvis was performed following the standard protocol without IV contrast. COMPARISON:  07/05/2020 FINDINGS: Lower chest: No acute abnormality. Hepatobiliary: No focal liver abnormality is seen. No gallstones, gallbladder wall thickening, or biliary dilatation. Pancreas: Unremarkable. No pancreatic ductal dilatation or surrounding inflammatory changes. Spleen: Normal in size without focal abnormality. Adrenals/Urinary Tract: Stable left adrenal lesion is  noted incompletely evaluated on this exam. The right adrenal gland is within normal limits. Kidneys are well visualize without renal calculi or obstructive changes. Cystic appearing lesion is noted in the upper pole of the right kidney stable from the prior study. No obstructive changes are seen. The bladder is partially distended. Stomach/Bowel: Fluid is noted throughout the colon consistent with the diarrheal state. Mild pericolonic inflammatory changes noted in the sigmoid likely related to colitis. No perforation is noted. No obstructive changes are noted. Cecum is mobile in the mid abdomen stable from the prior exam. The appendix is not well seen. No inflammatory changes are noted. Small bowel and stomach appear within normal limits. Vascular/Lymphatic: Atherosclerotic changes are noted with dilatation of the infrarenal aorta to 4.1 cm. This is an increase of 3 mm when compared with the prior exam. Tapering distally is noted. No significant lymphadenopathy is seen. Reproductive: Uterus and bilateral adnexa are unremarkable. Other: No abdominal wall hernia or abnormality. No abdominopelvic ascites. Musculoskeletal: Degenerative changes of lumbar spine are noted. IMPRESSION: Fluid throughout the colon consistent with the diarrheal state. Mild pericolonic inflammatory change in the sigmoid is seen consistent with colitis. No diverticulitis is seen. Stable left adrenal lesion. Stable cystic lesion in the right kidney. Dilatation of the abdominal aorta  to 4.1 cm. This is a slight increased from the prior exam at 3.8 cm. Recommend follow-up every 12 months and vascular consultation. This recommendation follows ACR consensus guidelines: White Paper of the ACR Incidental Findings Committee II on Vascular Findings. J Am Coll Radiol 2013; 10:789-794. Electronically Signed: By: Inez Catalina M.D. On: 03/25/2021 00:35   Medications: I have reviewed the patient's current medications. Scheduled Meds:  citalopram  40 mg  Oral Daily   insulin aspart  0-9 Units Subcutaneous TID WC   montelukast  10 mg Oral QHS   pantoprazole (PROTONIX) IV  40 mg Intravenous Q24H   Continuous Infusions:  ceFEPime (MAXIPIME) IV 2 g (03/26/21 0237)   lactated ringers Stopped (03/25/21 1303)   metronidazole 500 mg (03/26/21 0604)   PRN Meds:.morphine injection, ondansetron **OR** ondansetron (ZOFRAN) IV   Assessment: Principal Problem:   Acute colitis Active Problems:   Type 2 diabetes mellitus (HCC)   AAA (abdominal aortic aneurysm) without rupture (HCC)   AKI (acute kidney injury) ([place])  Diane Anderson is a 75 y.o. y/o female presented to the emergency room earlier today with acute diarrhea with rectal bleeding.  Hemoglobin is in the normal range.  Also presented with AKI.  CT scan of the abdomen shows mild inflammation in the sigmoid colon consistent with colitis.  The history and presentation is very suggestive of acute infectious colitis vs NSAID related colitis . Appears she has stopped bleeding now .  In view of age and significant leukocytosis agree with commencement of antibiotics.   Plan 1. Complete 3 days of antibiotics and then can stop. Suggest outpatient colonoscopy as she has never had one.   2. Advance diet as tolerated   I will sign off.  Please call me if any further GI concerns or questions.  We would like to thank you for the opportunity to participate in the care of [employer].      LOS: 1 day   Diane Bellows, MD 03/26/2021, 9:12 AM

## 2021-03-26 NOTE — Progress Notes (Signed)
[facility] Progress Note  Patient: Diane Anderson    ZOX:[id number]  DOA: 03/24/2021     Date of Service: the patient was seen and examined on 03/26/2021  Brief hospital course: Past medical history of type II DM, GERD, HLD, depression.  Presents with complaints of diarrhea with hematochezia.  Found to have sigmoid colitis.  Started on IV antibiotics.  GI consulted.  Currently no plan for intervention in the hospital. Currently plan is monitor for toleration of oral diet and improvement of abdominal pain.  Subjective: No nausea no vomiting.  No fever no chills.  Mild abdominal pain in the left lower quadrant.  No association with food.  No blood in the stool anymore.  No fever no chills.  No shortness of breath.  No chest pain.  Assessment and Plan: 1.  Acute sigmoid colitis SIRS, no sepsis. Hematochezia No bowel movement since arrival to the hospital. Initial plan was to check for C. difficile and GI pathogen panel but currently canceled. CT abdomen shows evidence of inflammatory thickening of the sigmoid colon. GI consulted. Outpatient follow-up recommended. Currently on IV cefepime and Flagyl.  Will advance diet to soft diet and monitor.  2.  Hematochezia Associated with colitis. H&H relatively stable. Avoid anticoagulation. No intervention inpatient with GI.  Appreciate assistance.  3.  Acute kidney injury Baseline serum creatinine 0.8. On presentation serum creatinine 1.78. Currently improving back to normal with IV fluids.  We will stop fluids.  4.  Hyponatremia From poor p.o. intake. Currently improving.  5.  Leukocytosis Secondary to colitis. Currently improving.  6.  Essential hypertension Blood pressure stable. Holding antihypertensive medication.  7.  HLD Crestor currently on hold. Monitor.  Scheduled Meds:  citalopram  40 mg Oral Daily   docusate sodium  100 mg Oral BID   insulin aspart  0-9 Units Subcutaneous TID WC   montelukast  10 mg  Oral QHS   pantoprazole (PROTONIX) IV  40 mg Intravenous Q24H   Continuous Infusions:  ceFEPime (MAXIPIME) IV 2 g (03/26/21 1401)   lactated ringers 100 mL/hr at 03/26/21 1154   metronidazole 500 mg (03/26/21 0604)   PRN Meds: acetaminophen **OR** acetaminophen, morphine injection, ondansetron **OR** ondansetron (ZOFRAN) IV  Body mass index is 28.34 kg/m.        DVT Prophylaxis:   Place and maintain sequential compression device Start: 03/25/21 0557 SCDs Start: 03/25/21 0339    Advance goals of care discussion: Pt is Full code.  Family Communication: no family was present at bedside, at the time of interview.   Data Reviewed: I have personally reviewed and interpreted daily labs, tele strips, imaging. Hemoglobin stable at 13. Sodium improved to 131. Serum creatinine improved from 1.78-0.7.  Physical Exam:  General: Appear in mild distress, no Rash; Oral Mucosa Clear, moist. no Abnormal Neck Mass Or lumps, Conjunctiva normal  Cardiovascular: S1 and S2 Present, no Murmur, Respiratory: good respiratory effort, Bilateral Air entry present and CTA, no Crackles, no wheezes Abdomen: Bowel Sound present, Soft and mild LLQ tenderness Extremities: no Pedal edema Neurology: alert and oriented to time, place, and person affect appropriate. no new focal deficit Gait not checked due to patient safety concerns  Vitals:   03/25/21 1516 03/25/21 2139 03/26/21 0451 03/26/21 0819  BP: (!) 132/56 (!) 147/53 (!) 144/54 140/64  Pulse: 61 64 (!) 59 62  Resp: 18 20 20 18   Temp: 98.8 F (37.1 C) 99.2 F (37.3 C) 98.6 F (37 C) 98.9 F (37.2 C)  TempSrc:  Oral Oral Oral   SpO2: 96% 96% 97% 97%  Weight:      Height:        Disposition:  Status is: Inpatient  Remains inpatient appropriate because:IV treatments appropriate due to intensity of illness or inability to take PO  Dispo: The patient is from: Home              Anticipated d/c is to: Home              Patient currently is  not medically stable to d/c.   Difficult to place patient No        Time spent: 35 minutes. I reviewed all nursing notes, pharmacy notes, vitals, pertinent old records. I have discussed plan of care as described above with RN.  Author: Berle Mull, MD [facility] 03/26/2021 2:33 PM  To reach On-call, see care teams to locate the attending and reach out via [email or web address]. Between 7PM-7AM, please contact night-coverage If you still have difficulty reaching the attending provider, please page the [facility] (Director on Call) for [facility] on amion for assistance.

## 2021-03-27 LAB — CBC
HCT: 37.8 % (ref 36.0–46.0)
Hemoglobin: 13.4 g/dL (ref 12.0–15.0)
MCH: 29.5 pg (ref 26.0–34.0)
MCHC: 35.4 g/dL (ref 30.0–36.0)
MCV: 83.1 fL (ref 80.0–100.0)
Platelets: 275 10*3/uL (ref 150–400)
RBC: 4.55 MIL/uL (ref 3.87–5.11)
RDW: 13.2 % (ref 11.5–15.5)
WBC: 13 10*3/uL — ABNORMAL HIGH (ref 4.0–10.5)
nRBC: 0 % (ref 0.0–0.2)

## 2021-03-27 LAB — BASIC METABOLIC PANEL
Anion gap: 7 (ref 5–15)
BUN: 13 mg/dL (ref 8–23)
CO2: 26 mmol/L (ref 22–32)
Calcium: 9.1 mg/dL (ref 8.9–10.3)
Chloride: 99 mmol/L (ref 98–111)
Creatinine, Ser: 0.61 mg/dL (ref 0.44–1.00)
GFR, Estimated: >60 mL/min (ref >60)
Glucose, Bld: 101 mg/dL — ABNORMAL HIGH (ref 70–99)
Potassium: 3.5 mmol/L (ref 3.5–5.1)
Sodium: 132 mmol/L — ABNORMAL LOW (ref 135–145)

## 2021-03-27 LAB — GLUCOSE, CAPILLARY
Glucose-Capillary: 92 mg/dL (ref 70–99)
Glucose-Capillary: 147 mg/dL — ABNORMAL HIGH (ref 70–99)

## 2021-03-27 MED ORDER — LISINOPRIL 2.5 MG PO TABS
2.5000 mg | ORAL_TABLET | Freq: Every day | ORAL | Status: DC
  Administered 2021-03-27: 2.5 mg via ORAL
  Filled 2021-03-27: qty 1

## 2021-03-27 MED ORDER — MECLIZINE HCL 25 MG PO TABS
12.5000 mg | ORAL_TABLET | Freq: Every morning | ORAL | Status: DC
  Administered 2021-03-27: 12.5 mg via ORAL
  Filled 2021-03-27: qty 1
  Filled 2021-03-27: qty 0.5

## 2021-03-27 NOTE — Progress Notes (Signed)
Diane Anderson to be D/C'd Home per MD order.  Discussed prescriptions and follow up appointments with the patient. Prescriptions given to patient, medication list explained in detail. Pt verbalized understanding.  Allergies as of 03/27/2021       Reactions   Cyclobenzaprine Other (See Comments), Rash   It made her vertigo worse.   Penicillins Rash   Adhesive [tape] Itching   "heavy" white tape   Atorvastatin Other (See Comments)   Severe Sweating        Medication List     TAKE these medications    citalopram 40 MG tablet Commonly known as: CELEXA Take 1 tablet by mouth daily.   lisinopril 2.5 MG tablet Commonly known as: ZESTRIL Take 1 tablet by mouth daily.   meclizine 25 MG tablet Commonly known as: ANTIVERT Take 12.5 mg by mouth every morning.   montelukast 10 MG tablet Commonly known as: SINGULAIR Take 1 tablet by mouth at bedtime.   omeprazole 20 MG capsule Commonly known as: PRILOSEC Take 1 capsule by mouth daily as needed.   ondansetron 4 MG disintegrating tablet Commonly known as: Zofran ODT Take 1 tablet (4 mg total) by mouth every 6 (six) hours as needed for nausea or vomiting.   rosuvastatin 20 MG tablet Commonly known as: CRESTOR Take 20 mg by mouth daily.   Vitamin D (Ergocalciferol) 1.25 MG (50000 UNIT) Caps capsule Commonly known as: DRISDOL Take 50,000 Units by mouth every 7 (seven) days.        Vitals:   03/27/21 0426 03/27/21 0808  BP: (!) 154/62 (!) 172/71  Pulse: 65 62  Resp: 20 16  Temp: 99.4 F (37.4 C) 98.9 F (37.2 C)  SpO2: 95% 95%    Skin clean, dry and intact without evidence of skin break down, no evidence of skin tears noted. IV catheter discontinued intact. Site without signs and symptoms of complications. Dressing and pressure applied. Pt denies pain at this time. No complaints noted.  An After Visit Summary was printed and given to the patient. Patient escorted via [place], and D/C home via private auto.  Fuller Mandril, RN

## 2021-03-27 NOTE — Discharge Summary (Signed)
[place] Discharge Summary  Diane Anderson KVQ:[id number] DOB: [birth year] DOA: 03/24/2021  PCP: Sofie Hartigan, MD  Admit date: 03/24/2021 Discharge date: 03/27/2021  Admitted From: Home Disposition: Home  Recommendations for Outpatient Follow-up:  Follow up with PCP in 1-2 weeks Follow-up with GI 1 to 2 months  Home Health: No Equipment/Devices: None  Discharge Condition: Stable CODE STATUS: Full Diet recommendation: Soft, bland  Brief/Interim Summary: Past medical history of type II DM, GERD, HLD, depression.  Presents with complaints of diarrhea with hematochezia.  Found to have sigmoid colitis.  Started on IV antibiotics.  GI consulted.  Currently no plan for intervention in the hospital. Currently plan is monitor for toleration of oral diet and improvement of abdominal pain.  GI consulted during course of admission.  No evidence of acute bleed requiring inpatient invention.  Per GI recommendations patient completed 3 days of antibiotics and will discharge home at this time.  No further antibiotics indicated at time of discharge.  Patient recommended to seek follow-up with gastroenterology as she has not had a colonoscopy.  Discharge Diagnoses:  Principal Problem:   Acute colitis Active Problems:   Type 2 diabetes mellitus (HCC)   AAA (abdominal aortic aneurysm) without rupture (HCC)   AKI (acute kidney injury) ([place])  Acute sigmoid colitis SIRS, no sepsis. Hematochezia No bowel movement since arrival to the hospital. Initial plan was to check for C. difficile and GI pathogen panel but currently canceled. CT abdomen shows evidence of inflammatory thickening of the sigmoid colon. GI consulted. Patient completed 3 days of IV antibiotics Tolerating diet with stable hemoglobin at time of discharge Discharge home with PCP and GI follow-up      Acute kidney injury Baseline serum creatinine 0.8. At or approaching baseline at time of discharge    Hyponatremia From poor p.o. intake. Currently improving.   Leukocytosis Secondary to colitis. Currently improving.   Essential hypertension Blood pressure stable. Can resume antihypertensive regimen at time of discharge   HLD Crestor held during admission.  Can resume at time of discharge  Discharge Instructions  Discharge Instructions     Diet - low sodium heart healthy   Complete by: As directed    Increase activity slowly   Complete by: As directed       Allergies as of 03/27/2021       Reactions   Cyclobenzaprine Other (See Comments), Rash   It made her vertigo worse.   Penicillins Rash   Adhesive [tape] Itching   "heavy" white tape   Atorvastatin Other (See Comments)   Severe Sweating        Medication List     TAKE these medications    citalopram 40 MG tablet Commonly known as: CELEXA Take 1 tablet by mouth daily.   lisinopril 2.5 MG tablet Commonly known as: ZESTRIL Take 1 tablet by mouth daily.   meclizine 25 MG tablet Commonly known as: ANTIVERT Take 12.5 mg by mouth every morning.   montelukast 10 MG tablet Commonly known as: SINGULAIR Take 1 tablet by mouth at bedtime.   omeprazole 20 MG capsule Commonly known as: PRILOSEC Take 1 capsule by mouth daily as needed.   ondansetron 4 MG disintegrating tablet Commonly known as: Zofran ODT Take 1 tablet (4 mg total) by mouth every 6 (six) hours as needed for nausea or vomiting.   rosuvastatin 20 MG tablet Commonly known as: CRESTOR Take 20 mg by mouth daily.   Vitamin D (Ergocalciferol) 1.25 MG (50000 UNIT) Caps capsule Commonly  known as: DRISDOL Take 50,000 Units by mouth every 7 (seven) days.        Follow-up Information     Jonathon Bellows, MD. Schedule an appointment as soon as possible for a visit in 4 week(s).   Specialty: Gastroenterology Why: Suggest a outpatient colonoscopy.  Please contact Dr. Georgeann Oppenheim office to schedule an appointment or you can request a referral from your  primary care doctor if preferred Contact information: [place] [address] [phone]                Allergies  Allergen Reactions   Cyclobenzaprine Other (See Comments) and Rash    It made her vertigo worse.   Penicillins Rash   Adhesive [Tape] Itching    "heavy" white tape   Atorvastatin Other (See Comments)    Severe Sweating    Consultations: GI   Procedures/Studies: CT ABDOMEN PELVIS WO CONTRAST  Addendum Date: 03/25/2021   ADDENDUM REPORT: 03/25/2021 01:53 ADDENDUM: It should be noted the aorta shows no findings of hemorrhage or impending rupture. These findings were discussed with the referring clinician. Electronically Signed   By: Inez Catalina M.D.   On: 03/25/2021 01:53   Result Date: 03/25/2021 CLINICAL DATA:  GI bleeding and left lower quadrant pain EXAM: CT ABDOMEN AND PELVIS WITHOUT CONTRAST TECHNIQUE: Multidetector CT imaging of the abdomen and pelvis was performed following the standard protocol without IV contrast. COMPARISON:  07/05/2020 FINDINGS: Lower chest: No acute abnormality. Hepatobiliary: No focal liver abnormality is seen. No gallstones, gallbladder wall thickening, or biliary dilatation. Pancreas: Unremarkable. No pancreatic ductal dilatation or surrounding inflammatory changes. Spleen: Normal in size without focal abnormality. Adrenals/Urinary Tract: Stable left adrenal lesion is noted incompletely evaluated on this exam. The right adrenal gland is within normal limits. Kidneys are well visualize without renal calculi or obstructive changes. Cystic appearing lesion is noted in the upper pole of the right kidney stable from the prior study. No obstructive changes are seen. The bladder is partially distended. Stomach/Bowel: Fluid is noted throughout the colon consistent with the diarrheal state. Mild pericolonic inflammatory changes noted in the sigmoid likely related to colitis. No perforation is noted. No obstructive  changes are noted. Cecum is mobile in the mid abdomen stable from the prior exam. The appendix is not well seen. No inflammatory changes are noted. Small bowel and stomach appear within normal limits. Vascular/Lymphatic: Atherosclerotic changes are noted with dilatation of the infrarenal aorta to 4.1 cm. This is an increase of 3 mm when compared with the prior exam. Tapering distally is noted. No significant lymphadenopathy is seen. Reproductive: Uterus and bilateral adnexa are unremarkable. Other: No abdominal wall hernia or abnormality. No abdominopelvic ascites. Musculoskeletal: Degenerative changes of lumbar spine are noted. IMPRESSION: Fluid throughout the colon consistent with the diarrheal state. Mild pericolonic inflammatory change in the sigmoid is seen consistent with colitis. No diverticulitis is seen. Stable left adrenal lesion. Stable cystic lesion in the right kidney. Dilatation of the abdominal aorta to 4.1 cm. This is a slight increased from the prior exam at 3.8 cm. Recommend follow-up every 12 months and vascular consultation. This recommendation follows ACR consensus guidelines: White Paper of the ACR Incidental Findings Committee II on Vascular Findings. J Am Coll Radiol 2013; 10:789-794. Electronically Signed: By: Inez Catalina M.D. On: 03/25/2021 00:35   US Venous Img Lower Unilateral Right (DVT)  Result Date: 02/27/2021 CLINICAL DATA:  Lower extremity pain and edema EXAM: RIGHT LOWER EXTREMITY VENOUS DUPLEX ULTRASOUND  TECHNIQUE: Gray-scale sonography with graded compression, as well as color Doppler and duplex ultrasound were performed to evaluate the right lower extremity deep venous system from the level of the common femoral vein and including the common femoral, femoral, profunda femoral, popliteal and calf veins including the posterior tibial, peroneal and gastrocnemius veins when visible. The superficial great saphenous vein was also interrogated. Spectral Doppler was utilized to  evaluate flow at rest and with distal augmentation maneuvers in the common femoral, femoral and popliteal veins. COMPARISON:  None. FINDINGS: Contralateral Common Femoral Vein: Respiratory phasicity is normal and symmetric with the symptomatic side. No evidence of thrombus. Normal compressibility. Common Femoral Vein: No evidence of thrombus. Normal compressibility, respiratory phasicity and response to augmentation. Saphenofemoral Junction: No evidence of thrombus. Normal compressibility and flow on color Doppler imaging. Profunda Femoral Vein: No evidence of thrombus. Normal compressibility and flow on color Doppler imaging. Femoral Vein: No evidence of thrombus. Normal compressibility, respiratory phasicity and response to augmentation. Popliteal Vein: No evidence of thrombus. Normal compressibility, respiratory phasicity and response to augmentation. Calf Veins: No evidence of thrombus. Normal compressibility and flow on color Doppler imaging. Superficial Great Saphenous Vein: No evidence of thrombus. Normal compressibility. Venous Reflux:  None. Other Findings:  None. IMPRESSION: No evidence of deep venous thrombosis in the right lower extremity. Left common femoral vein also patent. Electronically Signed   By: Lowella Grip III M.D.   On: 02/27/2021 11:41   (Echo, Carotid, EGD, Colonoscopy, ERCP)    Subjective: Seen and examined the day of discharge.  Stable no distress.  Tolerating p.o. intake.  Stable for discharge home.  Discharge Exam: Vitals:   03/27/21 0426 03/27/21 0808  BP: (!) 154/62 (!) 172/71  Pulse: 65 62  Resp: 20 16  Temp: 99.4 F (37.4 C) 98.9 F (37.2 C)  SpO2: 95% 95%   Vitals:   03/26/21 2235 03/27/21 0107 03/27/21 0426 03/27/21 0808  BP: (!) 160/61  (!) 154/62 (!) 172/71  Pulse: 60  65 62  Resp:   20 16  Temp: 98.4 F (36.9 C) 98.8 F (37.1 C) 99.4 F (37.4 C) 98.9 F (37.2 C)  TempSrc: Oral Oral Oral Oral  SpO2:   95% 95%  Weight:      Height:         General: Pt is alert, awake, not in acute distress Cardiovascular: RRR, S1/S2 +, no rubs, no gallops Respiratory: CTA bilaterally, no wheezing, no rhonchi Abdominal: Soft, NT, ND, bowel sounds + Extremities: no edema, no cyanosis    The results of significant diagnostics from this hospitalization (including imaging, microbiology, ancillary and laboratory) are listed below for reference.     Microbiology: Recent Results (from the past 240 hour(s))  Resp Panel by RT-PCR (Flu A&B, Covid) Nasopharyngeal Swab     Status: None   Collection Time: 03/25/21  1:36 AM   Specimen: Nasopharyngeal Swab; Nasopharyngeal(NP) swabs in vial transport medium  Result Value Ref Range Status   SARS Coronavirus 2 by RT PCR NEGATIVE NEGATIVE Final    Comment: (NOTE) SARS-CoV-2 target nucleic acids are NOT DETECTED.  The SARS-CoV-2 RNA is generally detectable in upper respiratory specimens during the acute phase of infection. The lowest concentration of SARS-CoV-2 viral copies this assay can detect is 138 copies/mL. A negative result does not preclude SARS-Cov-2 infection and should not be used as the sole basis for treatment or other patient management decisions. A negative result may occur with  improper specimen collection/handling, submission of specimen other than  nasopharyngeal swab, presence of viral mutation(s) within the areas targeted by this assay, and inadequate number of viral copies(<138 copies/mL). A negative result must be combined with clinical observations, patient history, and epidemiological information. The expected result is Negative.  Fact Sheet for Patients:  EntrepreneurPulse.com.au  Fact Sheet for Healthcare Providers:  IncredibleEmployment.be  This test is no t yet approved or cleared by the [place] FDA and  has been authorized for detection and/or diagnosis of SARS-CoV-2 by FDA under an Emergency Use Authorization (EUA). This  EUA will remain  in effect (meaning this test can be used) for the duration of the COVID-19 declaration under Section 564(b)(1) of the Act, 21 U.S.C.section 360bbb-3(b)(1), unless the authorization is terminated  or revoked sooner.       Influenza A by PCR NEGATIVE NEGATIVE Final   Influenza B by PCR NEGATIVE NEGATIVE Final    Comment: (NOTE) The Xpert Xpress SARS-CoV-2/FLU/RSV plus assay is intended as an aid in the diagnosis of influenza from Nasopharyngeal swab specimens and should not be used as a sole basis for treatment. Nasal washings and aspirates are unacceptable for Xpert Xpress SARS-CoV-2/FLU/RSV testing.  Fact Sheet for Patients: EntrepreneurPulse.com.au  Fact Sheet for Healthcare Providers: IncredibleEmployment.be  This test is not yet approved or cleared by the [place] FDA and has been authorized for detection and/or diagnosis of SARS-CoV-2 by FDA under an Emergency Use Authorization (EUA). This EUA will remain in effect (meaning this test can be used) for the duration of the COVID-19 declaration under Section 564(b)(1) of the Act, 21 U.S.C. section 360bbb-3(b)(1), unless the authorization is terminated or revoked.  Performed at [facility], [address] [place], [place] [address]   Blood culture (routine x 2)     Status: None (Preliminary result)   Collection Time: 03/25/21  1:36 AM   Specimen: BLOOD  Result Value Ref Range Status   Specimen Description BLOOD RIGHT ANTECUBITAL  Final   Special Requests   Final    BOTTLES DRAWN AEROBIC AND ANAEROBIC Blood Culture adequate volume   Culture   Final    NO GROWTH 2 DAYS Performed at [facility], [address] [place], [place] [address]    Report Status PENDING  Incomplete  Blood culture (routine x 2)     Status: None (Preliminary result)   Collection Time: 03/25/21  1:37 AM   Specimen: BLOOD  Result Value Ref Range Status   Specimen  Description BLOOD BLOOD LEFT HAND  Final   Special Requests   Final    BOTTLES DRAWN AEROBIC AND ANAEROBIC Blood Culture results may not be optimal due to an inadequate volume of blood received in culture bottles   Culture   Final    NO GROWTH 2 DAYS Performed at [facility], [address], [place], [place] [address]    Report Status PENDING  Incomplete     Labs: BNP (last 3 results) No results for input(s): BNP in the last 8760 hours. Basic Metabolic Panel: Recent Labs  Lab 03/24/21 2038 03/25/21 0359 03/26/21 0553 03/27/21 0715  NA 129* 129* 131* 132*  K 4.2 3.5 4.0 3.5  CL 97* 98 102 99  CO2 22 24 25 26   GLUCOSE 109* 115* 99 101*  BUN 25* 29* 15 13  CREATININE 1.78* 1.40* 0.70 0.61  CALCIUM 9.4 8.6* 9.0 9.1   Liver Function Tests: Recent Labs  Lab 03/24/21 2038  AST 23  ALT 14  ALKPHOS 66  BILITOT 0.4  PROT  7.1  ALBUMIN 4.1   No results for input(s): LIPASE, AMYLASE in the last 168 hours. No results for input(s): AMMONIA in the last 168 hours. CBC: Recent Labs  Lab 03/24/21 2038 03/25/21 0359 03/25/21 1122 03/25/21 1721 03/25/21 2306 03/26/21 0553 03/26/21 1117 03/27/21 0715  WBC 20.1* 16.1*  --   --   --  12.4*  --  13.0*  HGB 15.2* 13.8   < > 13.4 12.8 13.3 13.7 13.4  HCT 44.1 39.8   < > 38.7 37.6 37.8 40.0 37.8  MCV 85.3 84.1  --   --   --  85.7  --  83.1  PLT 335 301  --   --   --  267  --  275   < > = values in this interval not displayed.   Cardiac Enzymes: No results for input(s): CKTOTAL, CKMB, CKMBINDEX, TROPONINI in the last 168 hours. BNP: Invalid input(s): POCBNP CBG: Recent Labs  Lab 03/26/21 1140 03/26/21 1658 03/26/21 2051 03/27/21 0809 03/27/21 1132  GLUCAP 123* 102* 101* 92 147*   D-Dimer No results for input(s): DDIMER in the last 72 hours. Hgb A1c Recent Labs    03/25/21 0359  HGBA1C 5.9*   Lipid Profile No results for input(s): CHOL, HDL, LDLCALC, TRIG, CHOLHDL, LDLDIRECT in the last 72  hours. Thyroid function studies No results for input(s): TSH, T4TOTAL, T3FREE, THYROIDAB in the last 72 hours.  Invalid input(s): FREET3 Anemia work up No results for input(s): VITAMINB12, FOLATE, FERRITIN, TIBC, IRON, RETICCTPCT in the last 72 hours. Urinalysis    Component Value Date/Time   COLORURINE YELLOW (A) 07/05/2020 2112   APPEARANCEUR CLEAR (A) 07/05/2020 2112   LABSPEC >1.046 (H) 07/05/2020 2112   PHURINE 6.0 07/05/2020 2112   GLUCOSEU NEGATIVE 07/05/2020 2112   HGBUR NEGATIVE 07/05/2020 2112   BILIRUBINUR NEGATIVE 07/05/2020 2112   KETONESUR NEGATIVE 07/05/2020 2112   PROTEINUR NEGATIVE 07/05/2020 2112   NITRITE NEGATIVE 07/05/2020 2112   LEUKOCYTESUR TRACE (A) 07/05/2020 2112   Sepsis Labs Invalid input(s): PROCALCITONIN,  WBC,  LACTICIDVEN Microbiology Recent Results (from the past 240 hour(s))  Resp Panel by RT-PCR (Flu A&B, Covid) Nasopharyngeal Swab     Status: None   Collection Time: 03/25/21  1:36 AM   Specimen: Nasopharyngeal Swab; Nasopharyngeal(NP) swabs in vial transport medium  Result Value Ref Range Status   SARS Coronavirus 2 by RT PCR NEGATIVE NEGATIVE Final    Comment: (NOTE) SARS-CoV-2 target nucleic acids are NOT DETECTED.  The SARS-CoV-2 RNA is generally detectable in upper respiratory specimens during the acute phase of infection. The lowest concentration of SARS-CoV-2 viral copies this assay can detect is 138 copies/mL. A negative result does not preclude SARS-Cov-2 infection and should not be used as the sole basis for treatment or other patient management decisions. A negative result may occur with  improper specimen collection/handling, submission of specimen other than nasopharyngeal swab, presence of viral mutation(s) within the areas targeted by this assay, and inadequate number of viral copies(<138 copies/mL). A negative result must be combined with clinical observations, patient history, and epidemiological information. The  expected result is Negative.  Fact Sheet for Patients:  EntrepreneurPulse.com.au  Fact Sheet for Healthcare Providers:  IncredibleEmployment.be  This test is no t yet approved or cleared by the [place] FDA and  has been authorized for detection and/or diagnosis of SARS-CoV-2 by FDA under an Emergency Use Authorization (EUA). This EUA will remain  in effect (meaning this test can be used) for the duration  of the COVID-19 declaration under Section 564(b)(1) of the Act, 21 U.S.C.section 360bbb-3(b)(1), unless the authorization is terminated  or revoked sooner.       Influenza A by PCR NEGATIVE NEGATIVE Final   Influenza B by PCR NEGATIVE NEGATIVE Final    Comment: (NOTE) The Xpert Xpress SARS-CoV-2/FLU/RSV plus assay is intended as an aid in the diagnosis of influenza from Nasopharyngeal swab specimens and should not be used as a sole basis for treatment. Nasal washings and aspirates are unacceptable for Xpert Xpress SARS-CoV-2/FLU/RSV testing.  Fact Sheet for Patients: EntrepreneurPulse.com.au  Fact Sheet for Healthcare Providers: IncredibleEmployment.be  This test is not yet approved or cleared by the [place] FDA and has been authorized for detection and/or diagnosis of SARS-CoV-2 by FDA under an Emergency Use Authorization (EUA). This EUA will remain in effect (meaning this test can be used) for the duration of the COVID-19 declaration under Section 564(b)(1) of the Act, 21 U.S.C. section 360bbb-3(b)(1), unless the authorization is terminated or revoked.  Performed at [facility], [address] [place], [place] [address]   Blood culture (routine x 2)     Status: None (Preliminary result)   Collection Time: 03/25/21  1:36 AM   Specimen: BLOOD  Result Value Ref Range Status   Specimen Description BLOOD RIGHT ANTECUBITAL  Final   Special Requests   Final    BOTTLES DRAWN  AEROBIC AND ANAEROBIC Blood Culture adequate volume   Culture   Final    NO GROWTH 2 DAYS Performed at [facility], [address] [place], [place] [address]    Report Status PENDING  Incomplete  Blood culture (routine x 2)     Status: None (Preliminary result)   Collection Time: 03/25/21  1:37 AM   Specimen: BLOOD  Result Value Ref Range Status   Specimen Description BLOOD BLOOD LEFT HAND  Final   Special Requests   Final    BOTTLES DRAWN AEROBIC AND ANAEROBIC Blood Culture results may not be optimal due to an inadequate volume of blood received in culture bottles   Culture   Final    NO GROWTH 2 DAYS Performed at [facility], [address], [place], [place] [address]    Report Status PENDING  Incomplete     Time coordinating discharge: Over 30 minutes  SIGNED:   Sidney Ace, MD  [facility] 03/27/2021, 12:26 PM Pager   If 7PM-7AM, please contact night-coverage

## 2021-03-27 NOTE — Progress Notes (Signed)
Mobility Specialist - Progress Note   03/27/21 1300  Mobility  Activity Ambulated in room  Level of Assistance Independent  Assistive Device None  Distance Ambulated (ft) 30 ft  Mobility Ambulated independently in room  Mobility Response Tolerated well  Mobility performed by Mobility specialist  $Mobility charge 1 Mobility    Ambulating in room independently, pushing IV pole. No LOB. Does voice mild back pain, but not limited for mobility. No other complaints. Anticipating d/c later this date.    Kathee Delton Mobility Specialist 03/27/21, 1:40 PM

## 2021-03-30 LAB — CULTURE, BLOOD (ROUTINE X 2)
Culture: NO GROWTH
Culture: NO GROWTH
Special Requests: ADEQUATE

## 2021-04-03 DIAGNOSIS — E871 Hypo-osmolality and hyponatremia: Secondary | ICD-10-CM | POA: Diagnosis not present

## 2021-04-23 ENCOUNTER — Ambulatory Visit: Payer: PPO | Admitting: Gastroenterology

## 2021-04-29 ENCOUNTER — Other Ambulatory Visit: Payer: Self-pay

## 2021-04-29 ENCOUNTER — Encounter: Payer: Self-pay | Admitting: Gastroenterology

## 2021-04-29 ENCOUNTER — Ambulatory Visit: Payer: PPO | Admitting: Gastroenterology

## 2021-04-29 VITALS — BP 147/70 | HR 76 | Temp 98.3°F | Ht 63.0 in | Wt 160.0 lb

## 2021-04-29 DIAGNOSIS — N814 Uterovaginal prolapse, unspecified: Secondary | ICD-10-CM | POA: Diagnosis not present

## 2021-04-29 DIAGNOSIS — Z8719 Personal history of other diseases of the digestive system: Secondary | ICD-10-CM

## 2021-04-29 DIAGNOSIS — Z791 Long term (current) use of non-steroidal anti-inflammatories (NSAID): Secondary | ICD-10-CM | POA: Diagnosis not present

## 2021-04-29 NOTE — Patient Instructions (Addendum)
We will refer you to see an Gynecologist for an urine prolapse. They will call you and schedule this appointment for you.  Please let us know when you want to schedule your colonoscopy.  Polyethylene Glycol Powder for Oral Solution What is this medication? POLYETHYLENE GLYCOL 3350 (pol ee ETH i leen; GLYE col 3350) is a laxative used to treat constipation. It increases the amount of water in the stool. Bowelmovements become easier and more frequent. This medicine may be used for other purposes; ask your health care provider orpharmacist if you have questions. COMMON BRAND NAME(S): GaviLax, GIALAX, GlycoLax, Healthylax, MiraLax, SmoothLAX, Vita Health What should I tell my care team before I take this medication? They need to know if you have any of these conditions: history of blockage in your bowels nausea phenylketonuria stomach or intestine problem stomach pain sudden change in bowel habit lasting more than 2 weeks vomiting an unusual or allergic reaction to polyethylene glycol (PEG), other medicines, foods, dyes, or preservatives pregnant or trying to get pregnant breast-feeding How should I use this medication? Take this medicine by mouth. Take it as directed on the label. Add the right dose to 4 to 8 ounces or 120 to 240 mL of water, juice, soda, coffee or tea. Do not mix this medicine with foods or other liquids. Do not combine with starch-based thickeners (e.g., flour, cornstarch, arrowroot, tapioca, xanthangum). Mix well. Drink the solution. Do not use it more often than directed. Talk to your health care provider about the use of this medicine in children. While it may be given to children as young as 16 years for selected conditions,precautions do apply. Overdosage: If you think you have taken too much of this medicine contact apoison control center or emergency room at once. NOTE: This medicine is only for you. Do not share this medicine with others. What if I miss a dose? If  you miss a dose, take it as soon as you can. If it is almost time for yournext dose, take only that dose. Do not take double or extra doses. What may interact with this medication? Interactions are not expected. This list may not describe all possible interactions. Give your health care provider a list of all the medicines, herbs, non-prescription drugs, or dietary supplements you use. Also tell them if you smoke, drink alcohol, or use illegaldrugs. Some items may interact with your medicine. What should I watch for while using this medication? Do not use for more than one week without advice from your doctor or health care professional. If your constipation returns, check with your health careprovider. Drink plenty of water while taking this medicine. Drinking water helps decreaseconstipation. Stop using this medicine and contact your health care provider if you experience any rectal bleeding or do not have a bowel movement after use. Thesecould be signs of a more serious condition. What side effects may I notice from receiving this medication? Side effects that you should report to your doctor or health care professionalas soon as possible: allergic reactions (skin rash, itching or hives; swelling of the face, lips, or tongue) diarrhea severe bloating, pain, or distension of the stomach trouble breathing vomiting Side effects that usually do not require medical attention (report to yourdoctor or health care professional if they continue or are bothersome): nausea passing gas upset stomach This list may not describe all possible side effects. Call your doctor for medical advice about side effects. You may report side effects to FDA at1-800-FDA-1088. Where should I keep my  medication? Keep out of the reach of children and pets. Store at room temperature between 20 and 25 degrees C (68 and 77 degrees F).Get rid of any unused medicine after the expiration date. To get rid of medicines that are  no longer needed or have expired: Take the medicine to a medicine take-back program. Check with your pharmacy or law enforcement to find a location. If you cannot return the medicine, check the label or package insert to see if the medicine should be thrown out in the garbage or flushed down the toilet. If you are not sure, ask your health care provider. If it is safe to put it in the trash, pour the medicine out of the container. Mix the medicine with cat litter, dirt, coffee grounds, or other unwanted substance. Seal the mixture in a bag or container. Put it in the trash. NOTE: This sheet is a summary. It may not cover all possible information. If you have questions about this medicine, talk to your doctor, pharmacist, orhealth care provider.  2022 Elsevier/Gold Standard (2020-02-07 10:18:57)

## 2021-04-29 NOTE — Progress Notes (Signed)
Jonathon Bellows MD, MRCP(U.K) [address]  [place]  [place], [place] [address]  Main: [phone]  Fax: [phone]   Primary Care Physician: Sofie Hartigan, MD  Primary Gastroenterologist:  Dr. Jonathon Bellows    Chief complaint: Hospital follow-up.    HPI: Diane Anderson is a 75 y.o. female. She was admitted on 03/24/2021 and I was consulted for acute colitis.  She presented with 1 day history of left lower quadrant pain, constipation followed by diarrhea and passing blood per rectum.  She had been taking 2 ibuprofens daily for many months.  Some intentional weight loss.  CT scan of the abdomen showed fluid throughout the colon consistent with a diarrheal state and mild pericolonic inflammatory changes in the sigmoid colon seen with colitis.  Started on antibiotics and hemoglobin on admission was 15.2 with acute kidney injury that was improving.  She has never had a prior colonoscopy for colon cancer screening.  She completed 3 days of antibiotics and symptoms of rectal bleeding resolved with conservative management and plan was for an outpatient colonoscopy.  Has stopped using NSAIDs.  Denies any abdominal pain or diarrhea.  Presently suffers from constipation.  Has tried a few agents.  Has not tried MiraLAX.  She states that when she strains feels a bulge through her vagina.  She is concerned she may have an uterine prolapse.  CBC Latest Ref Rng & Units 03/27/2021 03/26/2021 03/26/2021  WBC 4.0 - 10.5 K/uL 13.0(H) - 12.4(H)  Hemoglobin 12.0 - 15.0 g/dL 13.4 13.7 13.3  Hematocrit 36.0 - 46.0 % 37.8 40.0 37.8  Platelets 150 - 400 K/uL 275 - 267     Current Outpatient Medications  Medication Sig Dispense Refill   citalopram (CELEXA) 40 MG tablet Take 1 tablet by mouth daily.     ibuprofen (ADVIL) 200 MG tablet Take 200 mg by mouth every 6 (six) hours as needed.     lisinopril (PRINIVIL,ZESTRIL) 2.5 MG tablet Take 1 tablet by mouth daily.     meclizine (ANTIVERT) 25 MG  tablet Take 12.5 mg by mouth every morning.     montelukast (SINGULAIR) 10 MG tablet Take 1 tablet by mouth daily.     omeprazole (PRILOSEC) 20 MG capsule Take 1 capsule by mouth daily as needed.      ondansetron (ZOFRAN ODT) 4 MG disintegrating tablet Take 1 tablet (4 mg total) by mouth every 6 (six) hours as needed for nausea or vomiting. 20 tablet 0   rosuvastatin (CRESTOR) 20 MG tablet Take 20 mg by mouth daily.  3   Vitamin D, Ergocalciferol, (DRISDOL) 50000 units CAPS capsule Take 50,000 Units by mouth every 7 (seven) days.     No current facility-administered medications for this visit.    Allergies as of 04/29/2021 - Review Complete 04/29/2021  Allergen Reaction Noted   Cyclobenzaprine Other (See Comments) and Rash 02/10/2019   Penicillins Rash 11/28/2013   Adhesive [tape] Itching 08/04/2018   Atorvastatin Other (See Comments) 08/27/2015    ROS:  General: Negative for anorexia, weight loss, fever, chills, fatigue, weakness. ENT: Negative for hoarseness, difficulty swallowing , nasal congestion. CV: Negative for chest pain, angina, palpitations, dyspnea on exertion, peripheral edema.  Respiratory: Negative for dyspnea at rest, dyspnea on exertion, cough, sputum, wheezing.  GI: See history of present illness. GU:  Negative for dysuria, hematuria, urinary incontinence, urinary frequency, nocturnal urination.  Endo: Negative for unusual weight change.    Physical Examination:   BP (!) 147/70  Pulse 76   Temp 98.3 F (36.8 C) (Oral)   Ht '5\' 3"'$  (1.6 m)   Wt 160 lb (72.6 kg)   BMI 28.34 kg/m   General: Well-nourished, well-developed in no acute distress.  Eyes: No icterus. Conjunctivae pink. Mouth: Oropharyngeal mucosa moist and pink , no lesions erythema or exudate. Neuro: Alert and oriented x 3.  Grossly intact. Skin: Warm and dry, no jaundice.   Psych: Alert and cooperative, normal mood and affect.   Imaging Studies: No results found.  Assessment and Plan:    Diane Anderson is a 75 y.o. y/o female is here for hospital follow-up.  Admitted in July 2022 with a history suggestive of inside related colitis.Resolved with conservative therapy. Never had a screening colonoscopy.  Plan  Colonoscopy  Commenced on MiraLAX 1 capful daily if no better she should contact my office for stronger medications  I will refer her to GYN as she gives a history concerning for uterine prolapse  I have discussed alternative options, risks & benefits,  which include, but are not limited to, bleeding, infection, perforation,respiratory complication & drug reaction.  The patient agrees with this plan & written consent will be obtained.     Dr Jonathon Bellows  MD,MRCP [facility]) Follow up in as needed

## 2021-04-30 NOTE — Addendum Note (Signed)
Addended by: Wayna Chalet on: 04/30/2021 01:34 PM   Modules accepted: Orders

## 2021-05-09 DIAGNOSIS — E871 Hypo-osmolality and hyponatremia: Secondary | ICD-10-CM | POA: Diagnosis not present

## 2021-05-22 DIAGNOSIS — J301 Allergic rhinitis due to pollen: Secondary | ICD-10-CM | POA: Diagnosis not present

## 2021-05-22 DIAGNOSIS — R42 Dizziness and giddiness: Secondary | ICD-10-CM | POA: Diagnosis not present

## 2021-05-22 DIAGNOSIS — E871 Hypo-osmolality and hyponatremia: Secondary | ICD-10-CM | POA: Diagnosis not present

## 2021-05-22 DIAGNOSIS — I714 Abdominal aortic aneurysm, without rupture: Secondary | ICD-10-CM | POA: Diagnosis not present

## 2021-05-22 DIAGNOSIS — R809 Proteinuria, unspecified: Secondary | ICD-10-CM | POA: Diagnosis not present

## 2021-05-22 DIAGNOSIS — K219 Gastro-esophageal reflux disease without esophagitis: Secondary | ICD-10-CM | POA: Diagnosis not present

## 2021-05-22 DIAGNOSIS — E1129 Type 2 diabetes mellitus with other diabetic kidney complication: Secondary | ICD-10-CM | POA: Diagnosis not present

## 2021-05-22 DIAGNOSIS — E559 Vitamin D deficiency, unspecified: Secondary | ICD-10-CM | POA: Diagnosis not present

## 2021-05-22 DIAGNOSIS — F3341 Major depressive disorder, recurrent, in partial remission: Secondary | ICD-10-CM | POA: Diagnosis not present

## 2021-05-22 DIAGNOSIS — E7800 Pure hypercholesterolemia, unspecified: Secondary | ICD-10-CM | POA: Diagnosis not present

## 2021-05-23 ENCOUNTER — Encounter: Payer: PPO | Admitting: Obstetrics and Gynecology

## 2021-06-14 ENCOUNTER — Encounter: Payer: PPO | Admitting: Obstetrics and Gynecology

## 2021-06-28 ENCOUNTER — Other Ambulatory Visit: Payer: Self-pay | Admitting: Family Medicine

## 2021-06-28 DIAGNOSIS — Z1231 Encounter for screening mammogram for malignant neoplasm of breast: Secondary | ICD-10-CM

## 2021-07-04 ENCOUNTER — Encounter: Payer: Self-pay | Admitting: General Surgery

## 2021-07-05 DIAGNOSIS — R809 Proteinuria, unspecified: Secondary | ICD-10-CM | POA: Diagnosis not present

## 2021-07-05 DIAGNOSIS — E1129 Type 2 diabetes mellitus with other diabetic kidney complication: Secondary | ICD-10-CM | POA: Diagnosis not present

## 2021-07-05 DIAGNOSIS — E7800 Pure hypercholesterolemia, unspecified: Secondary | ICD-10-CM | POA: Diagnosis not present

## 2021-07-16 DIAGNOSIS — R809 Proteinuria, unspecified: Secondary | ICD-10-CM | POA: Diagnosis not present

## 2021-07-16 DIAGNOSIS — F3341 Major depressive disorder, recurrent, in partial remission: Secondary | ICD-10-CM | POA: Diagnosis not present

## 2021-07-16 DIAGNOSIS — E871 Hypo-osmolality and hyponatremia: Secondary | ICD-10-CM | POA: Diagnosis not present

## 2021-07-16 DIAGNOSIS — K5900 Constipation, unspecified: Secondary | ICD-10-CM | POA: Diagnosis not present

## 2021-07-16 DIAGNOSIS — R42 Dizziness and giddiness: Secondary | ICD-10-CM | POA: Diagnosis not present

## 2021-07-16 DIAGNOSIS — J301 Allergic rhinitis due to pollen: Secondary | ICD-10-CM | POA: Diagnosis not present

## 2021-07-16 DIAGNOSIS — G4733 Obstructive sleep apnea (adult) (pediatric): Secondary | ICD-10-CM | POA: Diagnosis not present

## 2021-07-16 DIAGNOSIS — E1129 Type 2 diabetes mellitus with other diabetic kidney complication: Secondary | ICD-10-CM | POA: Diagnosis not present

## 2021-07-17 DIAGNOSIS — L821 Other seborrheic keratosis: Secondary | ICD-10-CM | POA: Diagnosis not present

## 2021-07-17 DIAGNOSIS — L918 Other hypertrophic disorders of the skin: Secondary | ICD-10-CM | POA: Diagnosis not present

## 2021-07-19 ENCOUNTER — Other Ambulatory Visit: Payer: Self-pay

## 2021-07-19 ENCOUNTER — Ambulatory Visit
Admission: RE | Admit: 2021-07-19 | Discharge: 2021-07-19 | Disposition: A | Discharge status: Home / Self Care | Payer: PPO | Source: Ambulatory Visit | Attending: Family Medicine | Admitting: Family Medicine

## 2021-07-19 DIAGNOSIS — Z1231 Encounter for screening mammogram for malignant neoplasm of breast: Secondary | ICD-10-CM | POA: Insufficient documentation

## 2021-08-27 ENCOUNTER — Ambulatory Visit (INDEPENDENT_AMBULATORY_CARE_PROVIDER_SITE_OTHER): Payer: PPO | Admitting: Vascular Surgery

## 2021-08-27 ENCOUNTER — Other Ambulatory Visit (INDEPENDENT_AMBULATORY_CARE_PROVIDER_SITE_OTHER): Payer: PPO

## 2021-11-20 DIAGNOSIS — E871 Hypo-osmolality and hyponatremia: Secondary | ICD-10-CM | POA: Diagnosis not present

## 2021-11-20 DIAGNOSIS — Z0000 Encounter for general adult medical examination without abnormal findings: Secondary | ICD-10-CM | POA: Diagnosis not present

## 2021-11-20 DIAGNOSIS — E7800 Pure hypercholesterolemia, unspecified: Secondary | ICD-10-CM | POA: Diagnosis not present

## 2021-11-20 DIAGNOSIS — F3341 Major depressive disorder, recurrent, in partial remission: Secondary | ICD-10-CM | POA: Diagnosis not present

## 2021-11-20 DIAGNOSIS — U071 COVID-19: Secondary | ICD-10-CM | POA: Diagnosis not present

## 2021-11-20 DIAGNOSIS — E1129 Type 2 diabetes mellitus with other diabetic kidney complication: Secondary | ICD-10-CM | POA: Diagnosis not present

## 2021-11-20 DIAGNOSIS — I714 Abdominal aortic aneurysm, without rupture, unspecified: Secondary | ICD-10-CM | POA: Diagnosis not present

## 2021-11-20 DIAGNOSIS — E559 Vitamin D deficiency, unspecified: Secondary | ICD-10-CM | POA: Diagnosis not present

## 2021-11-20 DIAGNOSIS — K219 Gastro-esophageal reflux disease without esophagitis: Secondary | ICD-10-CM | POA: Diagnosis not present

## 2021-11-20 DIAGNOSIS — Z03818 Encounter for observation for suspected exposure to other biological agents ruled out: Secondary | ICD-10-CM | POA: Diagnosis not present

## 2021-11-20 DIAGNOSIS — R051 Acute cough: Secondary | ICD-10-CM | POA: Diagnosis not present

## 2021-11-20 DIAGNOSIS — G4733 Obstructive sleep apnea (adult) (pediatric): Secondary | ICD-10-CM | POA: Diagnosis not present

## 2021-11-20 DIAGNOSIS — R809 Proteinuria, unspecified: Secondary | ICD-10-CM | POA: Diagnosis not present

## 2021-11-20 DIAGNOSIS — J301 Allergic rhinitis due to pollen: Secondary | ICD-10-CM | POA: Diagnosis not present

## 2021-11-20 DIAGNOSIS — R42 Dizziness and giddiness: Secondary | ICD-10-CM | POA: Diagnosis not present

## 2021-12-05 DIAGNOSIS — E119 Type 2 diabetes mellitus without complications: Secondary | ICD-10-CM | POA: Diagnosis not present

## 2022-01-15 ENCOUNTER — Emergency Department: Payer: PPO

## 2022-01-15 ENCOUNTER — Emergency Department
Admission: EM | Admit: 2022-01-15 | Discharge: 2022-01-15 | Disposition: A | Discharge status: Home / Self Care | Payer: PPO | Attending: Emergency Medicine | Admitting: Emergency Medicine

## 2022-01-15 DIAGNOSIS — Z5321 Procedure and treatment not carried out due to patient leaving prior to being seen by health care provider: Secondary | ICD-10-CM | POA: Insufficient documentation

## 2022-01-15 DIAGNOSIS — R079 Chest pain, unspecified: Secondary | ICD-10-CM | POA: Insufficient documentation

## 2022-01-15 DIAGNOSIS — R42 Dizziness and giddiness: Secondary | ICD-10-CM | POA: Diagnosis not present

## 2022-01-15 DIAGNOSIS — R0789 Other chest pain: Secondary | ICD-10-CM | POA: Diagnosis not present

## 2022-01-15 LAB — BASIC METABOLIC PANEL WITH GFR
Anion gap: 9 (ref 5–15)
BUN: 15 mg/dL (ref 8–23)
CO2: 25 mmol/L (ref 22–32)
Calcium: 9.5 mg/dL (ref 8.9–10.3)
Chloride: 95 mmol/L — ABNORMAL LOW (ref 98–111)
Creatinine, Ser: 0.69 mg/dL (ref 0.44–1.00)
GFR, Estimated: >60 mL/min
Glucose, Bld: 136 mg/dL — ABNORMAL HIGH (ref 70–99)
Potassium: 3.7 mmol/L (ref 3.5–5.1)
Sodium: 129 mmol/L — ABNORMAL LOW (ref 135–145)

## 2022-01-15 LAB — CBC
HCT: 41.9 % (ref 36.0–46.0)
Hemoglobin: 14.1 g/dL (ref 12.0–15.0)
MCH: 28.4 pg (ref 26.0–34.0)
MCHC: 33.7 g/dL (ref 30.0–36.0)
MCV: 84.3 fL (ref 80.0–100.0)
Platelets: 314 10*3/uL (ref 150–400)
RBC: 4.97 MIL/uL (ref 3.87–5.11)
RDW: 13.1 % (ref 11.5–15.5)
WBC: 10.9 10*3/uL — ABNORMAL HIGH (ref 4.0–10.5)
nRBC: 0 % (ref 0.0–0.2)

## 2022-01-15 LAB — TROPONIN I (HIGH SENSITIVITY): Troponin I (High Sensitivity): 10 ng/L (ref <18)

## 2022-01-15 NOTE — ED Provider Triage Note (Signed)
Emergency Medicine Provider Triage Evaluation Note ? ?Diane Anderson , a 76 y.o. female  was evaluated in triage.  Pt complains of dizziness, chest pain, patient has history of vertigo took her medication without any relief.  Symptoms started about 10 AM this morning. ? ?Review of Systems  ?Positive: Chest pain, dizziness, headache ?Negative: Vomiting ? ?Physical Exam  ?BP (!) 181/99   Pulse 72   Temp 98.6 ?F (37 ?C) (Oral)   Resp 18   Wt 68.9 kg   SpO2 91%   BMI 26.93 kg/m?  ?Gen:   Awake, no distress   ?Resp:  Normal effort  ?MSK:   Moves extremities without difficulty  ?Other:   ? ?Medical Decision Making  ?Medically screening exam initiated at 6:38 PM.  Appropriate orders placed.  Diane Anderson was informed that the remainder of the evaluation will be completed by another provider, this initial triage assessment does not replace that evaluation, and the importance of remaining in the ED until their evaluation is complete. ? ?CT of the head ordered, chest pain protocols ordered ?  ?Versie Starks, PA-C ?01/15/22 1839 ? ?

## 2022-01-15 NOTE — ED Notes (Signed)
Pt is a DNR but does not have form with her ?

## 2022-01-15 NOTE — ED Triage Notes (Signed)
Pt comes pov with chest pain and dizziness since about 1030 this morning. Hx of vertigo and states the dizziness feels the same. Chest pain is new.  ?

## 2022-02-27 ENCOUNTER — Other Ambulatory Visit: Payer: Self-pay | Admitting: Family Medicine

## 2022-02-27 DIAGNOSIS — Z1231 Encounter for screening mammogram for malignant neoplasm of breast: Secondary | ICD-10-CM

## 2022-03-05 DIAGNOSIS — R55 Syncope and collapse: Secondary | ICD-10-CM | POA: Diagnosis not present

## 2022-03-05 DIAGNOSIS — N390 Urinary tract infection, site not specified: Secondary | ICD-10-CM | POA: Diagnosis not present

## 2022-03-05 DIAGNOSIS — G309 Alzheimer's disease, unspecified: Secondary | ICD-10-CM | POA: Diagnosis not present

## 2022-03-05 DIAGNOSIS — F0150 Vascular dementia without behavioral disturbance: Secondary | ICD-10-CM | POA: Diagnosis not present

## 2022-03-05 DIAGNOSIS — I499 Cardiac arrhythmia, unspecified: Secondary | ICD-10-CM | POA: Diagnosis not present

## 2022-03-05 DIAGNOSIS — R42 Dizziness and giddiness: Secondary | ICD-10-CM | POA: Diagnosis not present

## 2022-03-05 DIAGNOSIS — F4489 Other dissociative and conversion disorders: Secondary | ICD-10-CM | POA: Diagnosis not present

## 2022-03-05 DIAGNOSIS — F0280 Dementia in other diseases classified elsewhere without behavioral disturbance: Secondary | ICD-10-CM | POA: Diagnosis not present

## 2022-03-05 DIAGNOSIS — K219 Gastro-esophageal reflux disease without esophagitis: Secondary | ICD-10-CM | POA: Diagnosis not present

## 2022-03-19 ENCOUNTER — Ambulatory Visit
Admission: EM | Admit: 2022-03-19 | Discharge: 2022-03-19 | Disposition: A | Discharge status: Home / Self Care | Payer: PPO | Attending: Internal Medicine | Admitting: Internal Medicine

## 2022-03-19 ENCOUNTER — Other Ambulatory Visit: Payer: Self-pay

## 2022-03-19 VITALS — BP 155/74 | HR 59 | Temp 98.5°F | Resp 16 | Ht 63.0 in | Wt 151.9 lb

## 2022-03-19 DIAGNOSIS — Z23 Encounter for immunization: Secondary | ICD-10-CM | POA: Diagnosis not present

## 2022-03-19 DIAGNOSIS — S91331A Puncture wound without foreign body, right foot, initial encounter: Secondary | ICD-10-CM

## 2022-03-19 MED ORDER — TETANUS-DIPHTH-ACELL PERTUSSIS 5-2.5-18.5 LF-MCG/0.5 IM SUSY
0.5000 mL | PREFILLED_SYRINGE | Freq: Once | INTRAMUSCULAR | Status: AC
  Administered 2022-03-19: 0.5 mL via INTRAMUSCULAR

## 2022-03-19 MED ORDER — CIPROFLOXACIN HCL 500 MG PO TABS
500.0000 mg | ORAL_TABLET | Freq: Two times a day (BID) | ORAL | 0 refills | Status: AC
Start: 2022-03-19 — End: 2022-03-24

## 2022-03-19 NOTE — Discharge Instructions (Addendum)
Warm salt water soaks Take medications as prescribed Tylenol as needed for pain If you experience any fever, chills, swelling, discharge please return to urgent care to be reevaluated.

## 2022-03-19 NOTE — ED Triage Notes (Signed)
Pt states she stepped on a metal tack yesterday with her right foot. She states it did not bleed and is not sore. She states she needs a tetanus vaccine.

## 2022-03-19 NOTE — ED Provider Notes (Signed)
[facility]    CSN: [id number] Arrival date & time: 03/19/22  1251      History   Chief Complaint Chief Complaint  Patient presents with   Puncture Wound    right    HPI Diane Anderson is a 76 y.o. female comes to the urgent care with 1 day history of puncture wound to the right foot.  This happened yesterday when she stepped over a metal tack.  She cleaned it with alcohol and denies any bleeding.  Patient's tetanus is not up-to-date.   HPI  Past Medical History:  Diagnosis Date   Allergy    Seasonal    Anxiety    Arthritis    left knee, left shoulder   Asthma    Depression    Diabetes mellitus without complication ([place])    diet controlled after weight loss   Discharge from left nipple 01/28/2017   Fibrocystic breast disease    GERD (gastroesophageal reflux disease)    Hx of migraines    Hypercholesteremia    Hyperlipidemia    Vertigo    avg 1x/mo   Vitamin D deficiency    Wears dentures    full upper    Patient Active Problem List   Diagnosis Date Noted   Acute colitis 03/25/2021   AKI (acute kidney injury) ([place]) 03/25/2021   Tobacco use disorder 11/06/2020   AAA (abdominal aortic aneurysm) without rupture ([place]) 07/12/2020   Aortic atherosclerosis ([place]) 07/12/2020   Lesion of right native kidney 07/12/2020   Pulmonary nodule, left 07/12/2020   Vomiting 04/26/2019   Chronic right shoulder pain 02/10/2019   Localized osteoarthritis of hands, bilateral 02/10/2019   Chronic midline low back pain with sciatica 12/27/2018   Hypercholesteremia    Allergy    Hx of migraines    Vitamin D deficiency    Acid reflux 03/13/2016   Pure hypercholesterolemia 03/13/2016   Type 2 diabetes mellitus ([place]) 08/21/2015   Recurrent major depressive disorder, in partial remission ([place]) 08/21/2015   Clinical depression 05/30/2014   Anxiety 01/19/2014   Avitaminosis D 01/19/2014    Past Surgical History:  Procedure Laterality Date   BREAST BIOPSY Right  2011   stereo bx benign   BREAST BIOPSY Left 04/18/2016   x3. 3:00 2 cmfn benign hyalinizing stroma cyst formation, 3:00 5cmfn benign ductal ectasia, 10:00 intraductal papilloma.   BREAST EXCISIONAL BIOPSY Right yrs ago    benign   BREAST EXCISIONAL BIOPSY Left 05/15/2016   excision to remove papilloma at 10:00 location   CATARACT EXTRACTION W/PHACO Left 08/10/2018   Procedure: CATARACT EXTRACTION PHACO AND INTRAOCULAR LENS PLACEMENT ([place])  LEFT DIABETIC;  Surgeon: Leandrew Koyanagi, MD;  Location: [facility];  Service: Ophthalmology;  Laterality: Left;  Diabetic - diet controlled   CATARACT EXTRACTION W/PHACO Right 08/31/2018   Procedure: CATARACT EXTRACTION PHACO AND INTRAOCULAR LENS PLACEMENT ([place])  RIGHT DIABETIC;  Surgeon: Leandrew Koyanagi, MD;  Location: [facility];  Service: Ophthalmology;  Laterality: Right;  Diabetic - diet controlled   CYST EXCISION     Dr. Pat Patrick   ETHMOIDECTOMY Right 08/04/2019   Procedure: ETHMOIDECTOMY WITH FRONTAL SINUOTOMY;  Surgeon: Margaretha Sheffield, MD;  Location: [facility];  Service: ENT;  Laterality: Right;  Diabetic - diet controlled   EXCISION OF BREAST BIOPSY Left 05/15/2016   Procedure: EXCISION OF BREAST BIOPSY;  Surgeon: Hubbard Robinson, MD;  Location: [facility];  Service: General;  Laterality: Left;   IMAGE GUIDED SINUS SURGERY Right 08/04/2019  Procedure: IMAGE GUIDED SINUS SURGERY;  Surgeon: Margaretha Sheffield, MD;  Location: [facility];  Service: ENT;  Laterality: Right;  disk on OR charge nurse desk 11-16 [place]   Right Shoulder   LYMPH GLAND EXCISION Right 2011   NASAL SEPTOPLASTY W/ TURBINOPLASTY Bilateral 08/04/2019   Procedure: NASAL SEPTOPLASTY WITH TURBINATE REDUCTION;  Surgeon: Margaretha Sheffield, MD;  Location: [facility];  Service: ENT;  Laterality: Bilateral;   TONSILLECTOMY     TUBAL LIGATION      OB History   No obstetric history on file.      Home Medications     Prior to Admission medications   Medication Sig Start Date End Date Taking? Authorizing Provider  ciprofloxacin (CIPRO) 500 MG tablet Take 1 tablet (500 mg total) by mouth every 12 (twelve) hours for 5 days. 03/19/22 03/24/22 Yes Tess Potts, Myrene Galas, MD  citalopram (CELEXA) 40 MG tablet Take 1 tablet by mouth daily. 02/04/16  Yes [provider]  lisinopril (PRINIVIL,ZESTRIL) 2.5 MG tablet Take 1 tablet by mouth daily. 04/04/16  Yes [provider]  meclizine (ANTIVERT) 25 MG tablet Take 12.5 mg by mouth every morning. 10/23/20  Yes [provider]  montelukast (SINGULAIR) 10 MG tablet Take 1 tablet by mouth daily. 03/08/21  Yes [provider]  omeprazole (PRILOSEC) 20 MG capsule Take 1 capsule by mouth daily as needed.  02/04/16  Yes [provider]  rosuvastatin (CRESTOR) 20 MG tablet Take 20 mg by mouth daily. 03/04/18  Yes [provider]  Vitamin D, Ergocalciferol, (DRISDOL) 50000 units CAPS capsule Take 50,000 Units by mouth every 7 (seven) days.   Yes [provider]  ibuprofen (ADVIL) 200 MG tablet Take 200 mg by mouth every 6 (six) hours as needed.    [provider]    Family History Family History  Problem Relation Age of Onset   Dementia Mother    Cancer Mother        skin   Osteoporosis Mother    Alzheimer's disease Father    Asthma Father    Hypertension Father    Breast cancer Maternal Aunt 30   Cancer Maternal Aunt        Breast Cancer   Hypertension Maternal Aunt    Birth defects Cousin 17       Breast   Breast cancer Cousin    Breast cancer Cousin    Hypertension Sister    Diabetes Paternal Grandmother    Breast cancer Maternal Grandfather     Social History Social History   Tobacco Use   Smoking status: Every Day    Packs/day: 1.00    Years: 52.00    Total pack years: 52.00    Types: Cigarettes   Smokeless tobacco: Never  Vaping Use   Vaping Use: Never used  Substance Use Topics    Alcohol use: No    Alcohol/week: 0.0 standard drinks of alcohol    Comment: rarely   Drug use: No     Allergies   Cyclobenzaprine, Penicillins, Adhesive [tape], and Atorvastatin   Review of Systems Review of Systems  Skin:  Positive for wound.     Physical Exam Triage Vital Signs ED Triage Vitals  Enc Vitals Group     BP 03/19/22 1345 (!) 155/74     Pulse Rate 03/19/22 1345 (!) 59     Resp 03/19/22 1345 16     Temp 03/19/22 1345 98.5 F (36.9 C)  Temp Source 03/19/22 1345 Oral     SpO2 03/19/22 1345 96 %     Weight 03/19/22 1343 151 lb 14.4 oz (68.9 kg)     Height 03/19/22 1343 '5\' 3"'$  (1.6 m)     Head Circumference --      Peak Flow --      Pain Score 03/19/22 1343 0     Pain Loc --      Pain Edu? --      Excl. in [place]? --    No data found.  Updated Vital Signs BP (!) 155/74 (BP Location: Right Arm)   Pulse (!) 59   Temp 98.5 F (36.9 C) (Oral)   Resp 16   Ht '5\' 3"'$  (1.6 m)   Wt 68.9 kg   SpO2 96%   BMI 26.91 kg/m   Visual Acuity Right Eye Distance:   Left Eye Distance:   Bilateral Distance:    Right Eye Near:   Left Eye Near:    Bilateral Near:     Physical Exam Vitals and nursing note reviewed.  Constitutional:      Appearance: Normal appearance.  Cardiovascular:     Rate and Rhythm: Normal rate and regular rhythm.  Musculoskeletal:        General: Normal range of motion.  Skin:    General: Skin is warm.     Comments: Puncture wound over the plantar foot.  The base of the right great toe.  Neurological:     Mental Status: She is alert.      UC Treatments / Results  Labs (all labs ordered are listed, but only abnormal results are displayed) Labs Reviewed - No data to display  EKG   Radiology No results found.  Procedures Procedures (including critical care time)  Medications Ordered in UC Medications  Tdap (BOOSTRIX) injection 0.5 mL (0.5 mLs Intramuscular Given 03/19/22 1358)    Initial Impression / Assessment and Plan /  UC Course  I have reviewed the triage vital signs and the nursing notes.  Pertinent labs & imaging results that were available during my care of the patient were reviewed by me and considered in my medical decision making (see chart for details).     1.  Puncture wound of the right foot: Tetanus injection Warm salt water soaks Ciprofloxacin 500 mg twice daily for 5 days Tylenol as needed for pain Return to urgent care if symptoms worsen. Final Clinical Impressions(s) / UC Diagnoses   Final diagnoses:  Puncture wound of plantar aspect of right foot, initial encounter     Discharge Instructions      Warm salt water soaks Take medications as prescribed Tylenol as needed for pain If you experience any fever, chills, swelling, discharge please return to urgent care to be reevaluated.   ED Prescriptions     Medication Sig Dispense Auth. Provider   ciprofloxacin (CIPRO) 500 MG tablet Take 1 tablet (500 mg total) by mouth every 12 (twelve) hours for 5 days. 10 tablet Jawanda Passey, Myrene Galas, MD      PDMP not reviewed this encounter.   Chase Picket, MD 03/19/22 1435

## 2022-03-27 DIAGNOSIS — R0602 Shortness of breath: Secondary | ICD-10-CM | POA: Diagnosis not present

## 2022-03-27 DIAGNOSIS — I10 Essential (primary) hypertension: Secondary | ICD-10-CM | POA: Diagnosis not present

## 2022-03-27 DIAGNOSIS — E7800 Pure hypercholesterolemia, unspecified: Secondary | ICD-10-CM | POA: Diagnosis not present

## 2022-03-27 DIAGNOSIS — I6523 Occlusion and stenosis of bilateral carotid arteries: Secondary | ICD-10-CM | POA: Diagnosis not present

## 2022-03-27 DIAGNOSIS — I700 Atherosclerosis of aorta: Secondary | ICD-10-CM | POA: Diagnosis not present

## 2022-03-27 DIAGNOSIS — R002 Palpitations: Secondary | ICD-10-CM | POA: Diagnosis not present

## 2022-03-27 DIAGNOSIS — I7143 Infrarenal abdominal aortic aneurysm, without rupture: Secondary | ICD-10-CM | POA: Diagnosis not present

## 2022-04-10 DIAGNOSIS — R002 Palpitations: Secondary | ICD-10-CM | POA: Diagnosis not present

## 2022-04-15 DIAGNOSIS — R0602 Shortness of breath: Secondary | ICD-10-CM | POA: Diagnosis not present

## 2022-04-15 DIAGNOSIS — I6523 Occlusion and stenosis of bilateral carotid arteries: Secondary | ICD-10-CM | POA: Diagnosis not present

## 2022-04-15 DIAGNOSIS — I700 Atherosclerosis of aorta: Secondary | ICD-10-CM | POA: Diagnosis not present

## 2022-04-15 DIAGNOSIS — I7143 Infrarenal abdominal aortic aneurysm, without rupture: Secondary | ICD-10-CM | POA: Diagnosis not present

## 2022-04-23 DIAGNOSIS — R933 Abnormal findings on diagnostic imaging of other parts of digestive tract: Secondary | ICD-10-CM | POA: Diagnosis not present

## 2022-04-23 DIAGNOSIS — R194 Change in bowel habit: Secondary | ICD-10-CM | POA: Diagnosis not present

## 2022-04-24 DIAGNOSIS — R0602 Shortness of breath: Secondary | ICD-10-CM | POA: Diagnosis not present

## 2022-05-15 ENCOUNTER — Encounter: Payer: Self-pay | Admitting: *Deleted

## 2022-05-16 ENCOUNTER — Ambulatory Visit: Payer: PPO | Admitting: Anesthesiology

## 2022-05-16 ENCOUNTER — Ambulatory Visit
Admission: RE | Admit: 2022-05-16 | Discharge: 2022-05-16 | Disposition: A | Discharge status: Home / Self Care | Payer: PPO | Attending: Gastroenterology | Admitting: Gastroenterology

## 2022-05-16 ENCOUNTER — Encounter: Payer: Self-pay | Admitting: *Deleted

## 2022-05-16 ENCOUNTER — Other Ambulatory Visit: Payer: Self-pay

## 2022-05-16 ENCOUNTER — Encounter
Admission: RE | Disposition: A | Discharge status: Home / Self Care | Payer: Self-pay | Source: Home / Self Care | Attending: Gastroenterology

## 2022-05-16 DIAGNOSIS — R194 Change in bowel habit: Secondary | ICD-10-CM | POA: Diagnosis not present

## 2022-05-16 DIAGNOSIS — Z79899 Other long term (current) drug therapy: Secondary | ICD-10-CM | POA: Diagnosis not present

## 2022-05-16 DIAGNOSIS — K640 First degree hemorrhoids: Secondary | ICD-10-CM | POA: Diagnosis not present

## 2022-05-16 DIAGNOSIS — D1779 Benign lipomatous neoplasm of other sites: Secondary | ICD-10-CM | POA: Diagnosis not present

## 2022-05-16 DIAGNOSIS — K649 Unspecified hemorrhoids: Secondary | ICD-10-CM | POA: Diagnosis not present

## 2022-05-16 DIAGNOSIS — D122 Benign neoplasm of ascending colon: Secondary | ICD-10-CM | POA: Insufficient documentation

## 2022-05-16 DIAGNOSIS — J449 Chronic obstructive pulmonary disease, unspecified: Secondary | ICD-10-CM | POA: Insufficient documentation

## 2022-05-16 DIAGNOSIS — D175 Benign lipomatous neoplasm of intra-abdominal organs: Secondary | ICD-10-CM | POA: Diagnosis not present

## 2022-05-16 DIAGNOSIS — K219 Gastro-esophageal reflux disease without esophagitis: Secondary | ICD-10-CM | POA: Insufficient documentation

## 2022-05-16 DIAGNOSIS — D125 Benign neoplasm of sigmoid colon: Secondary | ICD-10-CM | POA: Diagnosis not present

## 2022-05-16 DIAGNOSIS — K635 Polyp of colon: Secondary | ICD-10-CM | POA: Diagnosis not present

## 2022-05-16 DIAGNOSIS — K5730 Diverticulosis of large intestine without perforation or abscess without bleeding: Secondary | ICD-10-CM | POA: Insufficient documentation

## 2022-05-16 DIAGNOSIS — E119 Type 2 diabetes mellitus without complications: Secondary | ICD-10-CM | POA: Insufficient documentation

## 2022-05-16 DIAGNOSIS — F418 Other specified anxiety disorders: Secondary | ICD-10-CM | POA: Insufficient documentation

## 2022-05-16 DIAGNOSIS — M1990 Unspecified osteoarthritis, unspecified site: Secondary | ICD-10-CM | POA: Insufficient documentation

## 2022-05-16 DIAGNOSIS — F17200 Nicotine dependence, unspecified, uncomplicated: Secondary | ICD-10-CM | POA: Diagnosis not present

## 2022-05-16 DIAGNOSIS — D128 Benign neoplasm of rectum: Secondary | ICD-10-CM | POA: Diagnosis not present

## 2022-05-16 DIAGNOSIS — K621 Rectal polyp: Secondary | ICD-10-CM | POA: Diagnosis not present

## 2022-05-16 DIAGNOSIS — R933 Abnormal findings on diagnostic imaging of other parts of digestive tract: Secondary | ICD-10-CM | POA: Diagnosis present

## 2022-05-16 HISTORY — PX: COLONOSCOPY WITH PROPOFOL: SHX5780

## 2022-05-16 SURGERY — COLONOSCOPY WITH PROPOFOL
Anesthesia: General

## 2022-05-16 MED ORDER — PROPOFOL 500 MG/50ML IV EMUL
INTRAVENOUS | Status: DC | PRN
Start: 2022-05-16 — End: 2022-05-16
  Administered 2022-05-16: 150 ug/kg/min via INTRAVENOUS

## 2022-05-16 MED ORDER — PROPOFOL 10 MG/ML IV BOLUS
INTRAVENOUS | Status: DC | PRN
Start: 2022-05-16 — End: 2022-05-16
  Administered 2022-05-16: 50 mg via INTRAVENOUS

## 2022-05-16 MED ORDER — SODIUM CHLORIDE 0.9 % IV SOLN: INTRAVENOUS | Status: DC

## 2022-05-16 NOTE — Anesthesia Preprocedure Evaluation (Signed)
Anesthesia Evaluation  Patient identified by MRN, date of birth, ID band Patient awake    Reviewed: Allergy & Precautions, NPO status , Patient's Chart, lab work & pertinent test results  Airway Mallampati: III  TM Distance: >3 FB Neck ROM: Limited    Dental  (+) Edentulous Upper, Partial Lower, Poor Dentition, Dental Advisory Given   Pulmonary neg pulmonary ROS, asthma , COPD, Current Smoker and Patient abstained from smoking.,    Pulmonary exam normal  + decreased breath sounds      Cardiovascular Exercise Tolerance: Good negative cardio ROS Normal cardiovascular exam Rhythm:Regular     Neuro/Psych Anxiety Depression negative neurological ROS  negative psych ROS   GI/Hepatic negative GI ROS, Neg liver ROS, GERD  Medicated,  Endo/Other  negative endocrine ROSdiabetes  Renal/GU negative Renal ROS  negative genitourinary   Musculoskeletal  (+) Arthritis ,   Abdominal Normal abdominal exam  (+)   Peds negative pediatric ROS (+)  Hematology negative hematology ROS (+)   Anesthesia Other Findings Past Medical History: No date: Allergy     Comment:  Seasonal  No date: Anxiety No date: Arthritis     Comment:  left knee, left shoulder No date: Asthma No date: Depression No date: Diabetes mellitus without complication (HCC)     Comment:  diet controlled after weight loss 01/28/2017: Discharge from left nipple No date: Fibrocystic breast disease No date: GERD (gastroesophageal reflux disease) No date: Hx of migraines No date: Hypercholesteremia No date: Hyperlipidemia No date: Vertigo     Comment:  avg 1x/mo No date: Vitamin D deficiency No date: Wears dentures     Comment:  full upper  Past Surgical History: 2011: BREAST BIOPSY; Right     Comment:  stereo bx benign 04/18/2016: BREAST BIOPSY; Left     Comment:  x3. 3:00 2 cmfn benign hyalinizing stroma cyst               formation, 3:00 5cmfn benign ductal  ectasia, 10:00               intraductal papilloma. yrs ago : BREAST EXCISIONAL BIOPSY; Right     Comment:  benign 05/15/2016: BREAST EXCISIONAL BIOPSY; Left     Comment:  excision to remove papilloma at 10:00 location 08/10/2018: CATARACT EXTRACTION W/PHACO; Left     Comment:  Procedure: CATARACT EXTRACTION PHACO AND INTRAOCULAR               LENS PLACEMENT ([place])  LEFT DIABETIC;  Surgeon:               Leandrew Koyanagi, MD;  Location: [place];              Service: Ophthalmology;  Laterality: Left;  Diabetic -               diet controlled 08/31/2018: CATARACT EXTRACTION W/PHACO; Right     Comment:  Procedure: CATARACT EXTRACTION PHACO AND INTRAOCULAR               LENS PLACEMENT ([place])  RIGHT DIABETIC;  Surgeon:               Leandrew Koyanagi, MD;  Location: [place];              Service: Ophthalmology;  Laterality: Right;  Diabetic -               diet controlled No date: CYST EXCISION     Comment:  Dr. Pat Patrick 08/04/2019: ETHMOIDECTOMY; Right  Comment:  Procedure: ETHMOIDECTOMY WITH FRONTAL SINUOTOMY;                Surgeon: Margaretha Sheffield, MD;  Location: [place];  Service: ENT;  Laterality: Right;  Diabetic - diet              controlled 05/15/2016: EXCISION OF BREAST BIOPSY; Left     Comment:  Procedure: EXCISION OF BREAST BIOPSY;  Surgeon:               Hubbard Robinson, MD;  Location: [facility];  Service:               General;  Laterality: Left; 08/04/2019: IMAGE GUIDED SINUS SURGERY; Right     Comment:  Procedure: IMAGE GUIDED SINUS SURGERY;  Surgeon:               Margaretha Sheffield, MD;  Location: [place];                Service: ENT;  Laterality: Right;  disk on OR charge               nurse desk 11-16 kp 1995: [place]:  Right Shoulder 2011: [place]; Right 08/04/2019: NASAL SEPTOPLASTY W/ TURBINOPLASTY; Bilateral     Comment:  Procedure: NASAL SEPTOPLASTY WITH TURBINATE  REDUCTION;                Surgeon: Margaretha Sheffield, MD;  Location: [place];  Service: ENT;  Laterality: Bilateral; No date: TONSILLECTOMY No date: TUBAL LIGATION  BMI    Body Mass Index: 25.58 kg/m      Reproductive/Obstetrics negative OB ROS                             Anesthesia Physical Anesthesia Plan  ASA: 3  Anesthesia Plan: General   Post-op Pain Management:    Induction: Intravenous  PONV Risk Score and Plan: Propofol infusion and TIVA  Airway Management Planned: Natural Airway  Additional Equipment:   Intra-op Plan:   Post-operative Plan:   Informed Consent: I have reviewed the patients History and Physical, chart, labs and discussed the procedure including the risks, benefits and alternatives for the proposed anesthesia with the patient or authorized representative who has indicated his/her understanding and acceptance.     Dental Advisory Given  Plan Discussed with: CRNA and Surgeon  Anesthesia Plan Comments:         Anesthesia Quick Evaluation

## 2022-05-16 NOTE — H&P (Signed)
Outpatient short stay form Pre-procedure 05/16/2022  Diane Rubenstein, MD  Primary Physician: Sofie Hartigan, MD  Reason for visit:  Abnormal imaging  History of present illness:    76 y/o lady with history of hypertension here for colonoscopy. She has never had a colonoscopy before. Had a CT scan done that showed possible colitis. Has constipation at baseline. No blood thinners. No first degree relatives with GI malignancies. No abdominal surgeries.   No current facility-administered medications for this encounter.  Medications Prior to Admission  Medication Sig Dispense Refill Last Dose   citalopram (CELEXA) 40 MG tablet Take 1 tablet by mouth daily.   Past Week   ibuprofen (ADVIL) 200 MG tablet Take 200 mg by mouth every 6 (six) hours as needed.   Past Week   lisinopril (PRINIVIL,ZESTRIL) 2.5 MG tablet Take 1 tablet by mouth daily.   Past Week   meclizine (ANTIVERT) 25 MG tablet Take 12.5 mg by mouth every morning.   05/16/2022 at 0630   montelukast (SINGULAIR) 10 MG tablet Take 1 tablet by mouth daily.   Past Week   omeprazole (PRILOSEC) 20 MG capsule Take 1 capsule by mouth daily as needed.    Past Week   rosuvastatin (CRESTOR) 20 MG tablet Take 20 mg by mouth daily.  3 Past Week   Vitamin D, Ergocalciferol, (DRISDOL) 50000 units CAPS capsule Take 50,000 Units by mouth every 7 (seven) days.   Past Week     Allergies  Allergen Reactions   Cyclobenzaprine Other (See Comments) and Rash    It made her vertigo worse.   Penicillins Rash   Adhesive [Tape] Itching    "heavy" white tape   Atorvastatin Other (See Comments)    Severe Sweating     Past Medical History:  Diagnosis Date   Allergy    Seasonal    Anxiety    Arthritis    left knee, left shoulder   Asthma    Depression    Diabetes mellitus without complication ([place])    diet controlled after weight loss   Discharge from left nipple 01/28/2017   Fibrocystic breast disease    GERD (gastroesophageal reflux  disease)    Hx of migraines    Hypercholesteremia    Hyperlipidemia    Vertigo    avg 1x/mo   Vitamin D deficiency    Wears dentures    full upper    Review of systems:  Otherwise negative.    Physical Exam  Gen: Alert, oriented. Appears stated age.  HEENT: PERRLA. Lungs: No respiratory distress CV: RRR Abd: soft, benign, no masses Ext: No edema   Planned procedures: Proceed with colonoscopy. The patient understands the nature of the planned procedure, indications, risks, alternatives and potential complications including but not limited to bleeding, infection, perforation, damage to internal organs and possible oversedation/side effects from anesthesia. The patient agrees and gives consent to proceed.  Please refer to procedure notes for findings, recommendations and patient disposition/instructions.     Diane Rubenstein, MD [facility]

## 2022-05-16 NOTE — Interval H&P Note (Signed)
History and Physical Interval Note:  05/16/2022 9:45 AM  Diane Anderson  has presented today for surgery, with the diagnosis of bowel habit changes, abnormal ct scans of gi tract.  The various methods of treatment have been discussed with the patient and family. After consideration of risks, benefits and other options for treatment, the patient has consented to  Procedure(s) with comments: COLONOSCOPY WITH PROPOFOL (N/A) - DM as a surgical intervention.  The patient's history has been reviewed, patient examined, no change in status, stable for surgery.  I have reviewed the patient's chart and labs.  Questions were answered to the patient's satisfaction.     Lesly Rubenstein  Ok to proceed with colonoscopy

## 2022-05-16 NOTE — Anesthesia Procedure Notes (Signed)
Date/Time: 05/16/2022 10:12 AM  Performed by: Nelda Marseille, CRNAPre-anesthesia Checklist: Patient identified, Emergency Drugs available, Suction available, Patient being monitored and Timeout performed Oxygen Delivery Method: Nasal cannula

## 2022-05-16 NOTE — Op Note (Signed)
[facility] Patient Name: Diane Anderson Procedure Date: 05/16/2022 9:37 AM MRN: [id number] Account #: [id number] Date of Birth: Oct 26, 1945 Admit Type: Outpatient Age: 76 Room: [facility] ENDO ROOM 3 Gender: Female Note Status: Finalized Instrument Name: Jasper Riling [id number] Procedure:             Colonoscopy Indications:           Abnormal CT of the GI tract Providers:             Andrey Farmer MD, MD Referring MD:          Sofie Hartigan (Referring MD) Medicines:             Monitored Anesthesia Care Complications:         No immediate complications. Estimated blood loss:                         Minimal. Procedure:             Pre-Anesthesia Assessment:                        - Prior to the procedure, a History and Physical was                         performed, and patient medications and allergies were                         reviewed. The patient is competent. The risks and                         benefits of the procedure and the sedation options and                         risks were discussed with the patient. All questions                         were answered and informed consent was obtained.                         Patient identification and proposed procedure were                         verified by the physician, the nurse, the                         anesthesiologist, the anesthetist and the technician                         in the endoscopy suite. Mental Status Examination:                         alert and oriented. Airway Examination: normal                         oropharyngeal airway and neck mobility. Respiratory                         Examination: clear to auscultation. CV Examination:  normal. Prophylactic Antibiotics: The patient does not                         require prophylactic antibiotics. Prior                         Anticoagulants: The patient has taken no previous                          anticoagulant or antiplatelet agents. ASA Grade                         Assessment: III - A patient with severe systemic                         disease. After reviewing the risks and benefits, the                         patient was deemed in satisfactory condition to                         undergo the procedure. The anesthesia plan was to use                         monitored anesthesia care (MAC). Immediately prior to                         administration of medications, the patient was                         re-assessed for adequacy to receive sedatives. The                         heart rate, respiratory rate, oxygen saturations,                         blood pressure, adequacy of pulmonary ventilation, and                         response to care were monitored throughout the                         procedure. The physical status of the patient was                         re-assessed after the procedure.                        After obtaining informed consent, the colonoscope was                         passed under direct vision. Throughout the procedure,                         the patient's blood pressure, pulse, and oxygen                         saturations were monitored continuously. The  Colonoscope was introduced through the anus and                         advanced to the the cecum, identified by appendiceal                         orifice and ileocecal valve. The colonoscopy was                         technically difficult and complex due to restricted                         mobility of the colon, significant looping and a                         tortuous colon. Successful completion of the procedure                         was aided by withdrawing the scope and replacing with                         the pediatric colonoscope. The patient tolerated the                         procedure well. The quality of the bowel preparation                          was inadequate. Findings:      The perianal and digital rectal examinations were normal.      A 5 mm polyp was found in the ascending colon. The polyp was sessile.       The polyp was removed with a cold snare. Resection and retrieval were       complete. Estimated blood loss was minimal.      There was a small lipoma, in the ascending colon.      Two sessile polyps were found in the sigmoid colon. The polyps were 2 to       3 mm in size. These polyps were removed with a cold snare. Resection and       retrieval were complete. Estimated blood loss was minimal.      Two sessile polyps were found in the rectum. The polyps were 3 to 5 mm       in size. These polyps were removed with a cold snare. Resection and       retrieval were complete. Estimated blood loss was minimal.      Many small-mouthed diverticula were found in the sigmoid colon and       descending colon.      Internal hemorrhoids were found during retroflexion. The hemorrhoids       were Grade I (internal hemorrhoids that do not prolapse).      The exam was otherwise without abnormality on direct and retroflexion       views. Impression:            - Preparation of the colon was inadequate.                        - One 5 mm polyp in the ascending colon, removed with  a cold snare. Resected and retrieved.                        - Small lipoma in the ascending colon.                        - Two 2 to 3 mm polyps in the sigmoid colon, removed                         with a cold snare. Resected and retrieved.                        - Two 3 to 5 mm polyps in the rectum, removed with a                         cold snare. Resected and retrieved.                        - Diverticulosis in the sigmoid colon and in the                         descending colon.                        - Internal hemorrhoids.                        - The examination was otherwise normal on direct and                          retroflexion views. Recommendation:        - Discharge patient to home.                        - Resume previous diet.                        - Continue present medications.                        - Await pathology results.                        - Repeat colonoscopy in 6 months because the bowel                         preparation was suboptimal.                        - Return to referring physician as previously                         scheduled. Procedure Code(s):     --- Professional ---                        [phone], Colonoscopy, flexible; with removal of                         tumor(s), polyp(s), or other lesion(s) by snare  technique Diagnosis Code(s):     --- Professional ---                        K64.0, First degree hemorrhoids                        K63.5, Polyp of colon                        D17.5, Benign lipomatous neoplasm of intra-abdominal                         organs                        K62.1, Rectal polyp                        K57.30, Diverticulosis of large intestine without                         perforation or abscess without bleeding                        R93.3, Abnormal findings on diagnostic imaging of                         other parts of digestive tract CPT copyright 2019 [employer]. All rights reserved. The codes documented in this report are preliminary and upon coder review may  be revised to meet current compliance requirements. Andrey Farmer MD, MD 05/16/2022 10:50:07 AM Number of Addenda: 0 Note Initiated On: 05/16/2022 9:37 AM Scope Withdrawal Time: 0 hours 15 minutes 2 seconds  Total Procedure Duration: 0 hours 33 minutes 6 seconds  Estimated Blood Loss:  Estimated blood loss was minimal.      [facility]

## 2022-05-16 NOTE — Transfer of Care (Signed)
Immediate Anesthesia Transfer of Care Note  Patient: Diane Anderson  Procedure(s) Performed: COLONOSCOPY WITH PROPOFOL  Patient Location: PACU  Anesthesia Type:General  Level of Consciousness: awake and sedated  Airway & Oxygen Therapy: Patient Spontanous Breathing  Post-op Assessment: Report given to RN and Post -op Vital signs reviewed and stable  Post vital signs: Reviewed and stable  Last Vitals:  Vitals Value Taken Time  BP 121/50 05/16/22 1047  Temp    Pulse 69 05/16/22 1047  Resp 16 05/16/22 1047  SpO2 97 % 05/16/22 1047    Last Pain:  Vitals:   05/16/22 1047  TempSrc:   PainSc: Asleep         Complications: No notable events documented.

## 2022-05-20 ENCOUNTER — Encounter: Payer: Self-pay | Admitting: Gastroenterology

## 2022-05-20 DIAGNOSIS — I7143 Infrarenal abdominal aortic aneurysm, without rupture: Secondary | ICD-10-CM | POA: Diagnosis not present

## 2022-05-20 DIAGNOSIS — E7800 Pure hypercholesterolemia, unspecified: Secondary | ICD-10-CM | POA: Diagnosis not present

## 2022-05-20 DIAGNOSIS — I700 Atherosclerosis of aorta: Secondary | ICD-10-CM | POA: Diagnosis not present

## 2022-05-20 DIAGNOSIS — I10 Essential (primary) hypertension: Secondary | ICD-10-CM | POA: Diagnosis not present

## 2022-05-20 DIAGNOSIS — R002 Palpitations: Secondary | ICD-10-CM | POA: Diagnosis not present

## 2022-05-20 LAB — SURGICAL PATHOLOGY

## 2022-05-27 ENCOUNTER — Ambulatory Visit (INDEPENDENT_AMBULATORY_CARE_PROVIDER_SITE_OTHER): Payer: PPO | Admitting: Vascular Surgery

## 2022-05-28 NOTE — Anesthesia Postprocedure Evaluation (Signed)
Anesthesia Post Note  Patient: Diane Anderson  Procedure(s) Performed: COLONOSCOPY WITH PROPOFOL  Patient location during evaluation: PACU Anesthesia Type: General Level of consciousness: awake and oriented Pain management: pain level controlled Vital Signs Assessment: post-procedure vital signs reviewed and stable Respiratory status: spontaneous breathing and nonlabored ventilation Cardiovascular status: stable Anesthetic complications: no   No notable events documented.   Last Vitals:  Vitals:   05/16/22 1057 05/16/22 1107  BP: (!) 96/56 139/60  Pulse: 60 61  Resp: 10 15  Temp:    SpO2: 99% 99%    Last Pain:  Vitals:   05/17/22 0837  TempSrc:   PainSc: 1                  VAN STAVEREN,Chet Greenley

## 2022-05-30 DIAGNOSIS — E559 Vitamin D deficiency, unspecified: Secondary | ICD-10-CM | POA: Diagnosis not present

## 2022-05-30 DIAGNOSIS — I6522 Occlusion and stenosis of left carotid artery: Secondary | ICD-10-CM | POA: Diagnosis not present

## 2022-05-30 DIAGNOSIS — J301 Allergic rhinitis due to pollen: Secondary | ICD-10-CM | POA: Diagnosis not present

## 2022-05-30 DIAGNOSIS — R413 Other amnesia: Secondary | ICD-10-CM | POA: Diagnosis not present

## 2022-05-30 DIAGNOSIS — Z23 Encounter for immunization: Secondary | ICD-10-CM | POA: Diagnosis not present

## 2022-05-30 DIAGNOSIS — R809 Proteinuria, unspecified: Secondary | ICD-10-CM | POA: Diagnosis not present

## 2022-05-30 DIAGNOSIS — R42 Dizziness and giddiness: Secondary | ICD-10-CM | POA: Diagnosis not present

## 2022-05-30 DIAGNOSIS — E871 Hypo-osmolality and hyponatremia: Secondary | ICD-10-CM | POA: Diagnosis not present

## 2022-05-30 DIAGNOSIS — E1129 Type 2 diabetes mellitus with other diabetic kidney complication: Secondary | ICD-10-CM | POA: Diagnosis not present

## 2022-05-30 DIAGNOSIS — K219 Gastro-esophageal reflux disease without esophagitis: Secondary | ICD-10-CM | POA: Diagnosis not present

## 2022-05-30 DIAGNOSIS — E7800 Pure hypercholesterolemia, unspecified: Secondary | ICD-10-CM | POA: Diagnosis not present

## 2022-05-30 DIAGNOSIS — I714 Abdominal aortic aneurysm, without rupture, unspecified: Secondary | ICD-10-CM | POA: Diagnosis not present

## 2022-05-30 DIAGNOSIS — F3341 Major depressive disorder, recurrent, in partial remission: Secondary | ICD-10-CM | POA: Diagnosis not present

## 2022-06-05 DIAGNOSIS — I714 Abdominal aortic aneurysm, without rupture, unspecified: Secondary | ICD-10-CM | POA: Diagnosis not present

## 2022-06-05 DIAGNOSIS — E1129 Type 2 diabetes mellitus with other diabetic kidney complication: Secondary | ICD-10-CM | POA: Diagnosis not present

## 2022-06-05 DIAGNOSIS — R809 Proteinuria, unspecified: Secondary | ICD-10-CM | POA: Diagnosis not present

## 2022-06-05 DIAGNOSIS — E7800 Pure hypercholesterolemia, unspecified: Secondary | ICD-10-CM | POA: Diagnosis not present

## 2022-06-05 DIAGNOSIS — F3341 Major depressive disorder, recurrent, in partial remission: Secondary | ICD-10-CM | POA: Diagnosis not present

## 2022-06-24 ENCOUNTER — Ambulatory Visit (INDEPENDENT_AMBULATORY_CARE_PROVIDER_SITE_OTHER): Payer: PPO | Admitting: Vascular Surgery

## 2022-07-21 ENCOUNTER — Ambulatory Visit
Admission: RE | Admit: 2022-07-21 | Discharge: 2022-07-21 | Disposition: A | Discharge status: Home / Self Care | Payer: PPO | Source: Ambulatory Visit | Attending: Family Medicine | Admitting: Family Medicine

## 2022-07-21 DIAGNOSIS — Z1231 Encounter for screening mammogram for malignant neoplasm of breast: Secondary | ICD-10-CM | POA: Insufficient documentation

## 2022-08-04 DIAGNOSIS — K219 Gastro-esophageal reflux disease without esophagitis: Secondary | ICD-10-CM | POA: Diagnosis not present

## 2022-08-04 DIAGNOSIS — K5909 Other constipation: Secondary | ICD-10-CM | POA: Diagnosis not present

## 2022-08-04 DIAGNOSIS — Z86010 Personal history of colonic polyps: Secondary | ICD-10-CM | POA: Diagnosis not present

## 2022-09-16 ENCOUNTER — Ambulatory Visit
Admission: EM | Admit: 2022-09-16 | Discharge: 2022-09-16 | Disposition: A | Discharge status: Home / Self Care | Payer: PPO | Attending: Physician Assistant | Admitting: Physician Assistant

## 2022-09-16 ENCOUNTER — Encounter: Payer: Self-pay | Admitting: Emergency Medicine

## 2022-09-16 ENCOUNTER — Ambulatory Visit (INDEPENDENT_AMBULATORY_CARE_PROVIDER_SITE_OTHER): Payer: PPO

## 2022-09-16 DIAGNOSIS — J3081 Allergic rhinitis due to animal (cat) (dog) hair and dander: Secondary | ICD-10-CM | POA: Diagnosis not present

## 2022-09-16 DIAGNOSIS — J111 Influenza due to unidentified influenza virus with other respiratory manifestations: Secondary | ICD-10-CM

## 2022-09-16 DIAGNOSIS — Z720 Tobacco use: Secondary | ICD-10-CM

## 2022-09-16 DIAGNOSIS — R051 Acute cough: Secondary | ICD-10-CM

## 2022-09-16 DIAGNOSIS — R059 Cough, unspecified: Secondary | ICD-10-CM | POA: Diagnosis not present

## 2022-09-16 DIAGNOSIS — R509 Fever, unspecified: Secondary | ICD-10-CM | POA: Diagnosis not present

## 2022-09-16 DIAGNOSIS — J439 Emphysema, unspecified: Secondary | ICD-10-CM | POA: Diagnosis not present

## 2022-09-16 MED ORDER — PSEUDOEPH-BROMPHEN-DM 30-2-10 MG/5ML PO SYRP: 10.0000 mL | ORAL_SOLUTION | Freq: Four times a day (QID) | ORAL | 0 refills | Status: AC | PRN

## 2022-09-16 NOTE — Discharge Instructions (Addendum)
-  You likely have the flu given your exposure and symptoms.  You do not have any evidence of pneumonia on your x-ray.  Your exam is reassuring. - You probably are also having increased congestion related to your allergies. - I have sent a medication to the pharmacy to help with congestion and cough.  Increase your rest and fluids.  If you develop another fever or have sinus pain or breathing difficulty, you should return for reevaluation. -Try to stop smoking.  There is evidence of emphysema which is a chronic lung disease on your x-ray.

## 2022-09-16 NOTE — ED Provider Notes (Signed)
[facility]    CSN: [id number] Arrival date & time: 09/16/22  1329      History   Chief Complaint Chief Complaint  Patient presents with   Cough    HPI Diane Anderson is a 77 y.o. female presenting for 1 week history of cough, congestion, scratchy throat, loss of appetite, nausea.  Reports had a fever up to 101 degrees last week.  She says that her sister came to visit her from [place] and had the flu.  She says she also put her dog and patient is allergic to dogs and has had increased congestion related to that.  She says when she leaves the house she has less congestion and sneezing.  She believes she has the flu and allergies related to the dog.  She says she has been feeling a little better than she was at onset.  She is not reporting any sinus pain, ear pain, chest pain or shortness of breath.  She is a longtime smoker but says she is never been told she has had COPD.  Other medical history significant for hyperlipidemia, GERD and diet-controlled diabetes.  HPI  Past Medical History:  Diagnosis Date   Allergy    Seasonal    Anxiety    Arthritis    left knee, left shoulder   Asthma    Depression    Diabetes mellitus without complication ([place])    diet controlled after weight loss   Discharge from left nipple 01/28/2017   Fibrocystic breast disease    GERD (gastroesophageal reflux disease)    Hx of migraines    Hypercholesteremia    Hyperlipidemia    Vertigo    avg 1x/mo   Vitamin D deficiency    Wears dentures    full upper    Patient Active Problem List   Diagnosis Date Noted   Acute colitis 03/25/2021   AKI (acute kidney injury) ([place]) 03/25/2021   Tobacco use disorder 11/06/2020   AAA (abdominal aortic aneurysm) without rupture ([place]) 07/12/2020   Aortic atherosclerosis ([place]) 07/12/2020   Lesion of right native kidney 07/12/2020   Pulmonary nodule, left 07/12/2020   Vomiting 04/26/2019   Chronic right shoulder pain 02/10/2019   Localized  osteoarthritis of hands, bilateral 02/10/2019   Chronic midline low back pain with sciatica 12/27/2018   Hypercholesteremia    Allergy    Hx of migraines    Vitamin D deficiency    Acid reflux 03/13/2016   Pure hypercholesterolemia 03/13/2016   Type 2 diabetes mellitus ([place]) 08/21/2015   Recurrent major depressive disorder, in partial remission ([place]) 08/21/2015   Clinical depression 05/30/2014   Anxiety 01/19/2014   Avitaminosis D 01/19/2014    Past Surgical History:  Procedure Laterality Date   BREAST BIOPSY Right 2011   stereo bx benign   BREAST BIOPSY Left 04/18/2016   x3. 3:00 2 cmfn benign hyalinizing stroma cyst formation, 3:00 5cmfn benign ductal ectasia, 10:00 intraductal papilloma.   BREAST EXCISIONAL BIOPSY Right yrs ago    benign   BREAST EXCISIONAL BIOPSY Left 05/15/2016   excision to remove papilloma at 10:00 location   CATARACT EXTRACTION W/PHACO Left 08/10/2018   Procedure: CATARACT EXTRACTION PHACO AND INTRAOCULAR LENS PLACEMENT ([place])  LEFT DIABETIC;  Surgeon: Leandrew Koyanagi, MD;  Location: [place];  Service: Ophthalmology;  Laterality: Left;  Diabetic - diet controlled   CATARACT EXTRACTION W/PHACO Right 08/31/2018   Procedure: CATARACT EXTRACTION PHACO AND INTRAOCULAR LENS PLACEMENT ([facility])  RIGHT DIABETIC;  Surgeon: Wallace Going,  Nila Nephew, MD;  Location: [place];  Service: Ophthalmology;  Laterality: Right;  Diabetic - diet controlled   COLONOSCOPY WITH PROPOFOL N/A 05/16/2022   Procedure: COLONOSCOPY WITH PROPOFOL;  Surgeon: Lesly Rubenstein, MD;  Location: [facility];  Service: Endoscopy;  Laterality: N/A;  DM   CYST EXCISION     Dr. Pat Patrick   ETHMOIDECTOMY Right 08/04/2019   Procedure: ETHMOIDECTOMY WITH FRONTAL SINUOTOMY;  Surgeon: Margaretha Sheffield, MD;  Location: [place];  Service: ENT;  Laterality: Right;  Diabetic - diet controlled   EXCISION OF BREAST BIOPSY Left 05/15/2016   Procedure: EXCISION OF BREAST BIOPSY;   Surgeon: Hubbard Robinson, MD;  Location: [facility];  Service: General;  Laterality: Left;   IMAGE GUIDED SINUS SURGERY Right 08/04/2019   Procedure: IMAGE GUIDED SINUS SURGERY;  Surgeon: Margaretha Sheffield, MD;  Location: [place];  Service: ENT;  Laterality: Right;  disk on OR charge nurse desk 11-16 [place]   Right Shoulder   LYMPH GLAND EXCISION Right 2011   NASAL SEPTOPLASTY W/ TURBINOPLASTY Bilateral 08/04/2019   Procedure: NASAL SEPTOPLASTY WITH TURBINATE REDUCTION;  Surgeon: Margaretha Sheffield, MD;  Location: [place];  Service: ENT;  Laterality: Bilateral;   TONSILLECTOMY     TUBAL LIGATION      OB History   No obstetric history on file.      Home Medications    Prior to Admission medications   Medication Sig Start Date End Date Taking? Authorizing Provider  brompheniramine-pseudoephedrine-DM 30-2-10 MG/5ML syrup Take 10 mLs by mouth 4 (four) times daily as needed for up to 7 days. 09/16/22 09/23/22 Yes Danton Clap, PA-C  citalopram (CELEXA) 40 MG tablet Take 1 tablet by mouth daily. 02/04/16  Yes [provider]  lisinopril (PRINIVIL,ZESTRIL) 2.5 MG tablet Take 1 tablet by mouth daily. 04/04/16  Yes [provider]  meclizine (ANTIVERT) 25 MG tablet Take 12.5 mg by mouth every morning. 10/23/20  Yes [provider]  montelukast (SINGULAIR) 10 MG tablet Take 1 tablet by mouth daily. 03/08/21  Yes [provider]  omeprazole (PRILOSEC) 20 MG capsule Take 1 capsule by mouth daily as needed.  02/04/16  Yes [provider]  rosuvastatin (CRESTOR) 20 MG tablet Take 20 mg by mouth daily. 03/04/18  Yes [provider]  Vitamin D, Ergocalciferol, (DRISDOL) 50000 units CAPS capsule Take 50,000 Units by mouth every 7 (seven) days.   Yes [provider]  ibuprofen (ADVIL) 200 MG tablet Take 200 mg by mouth every 6 (six) hours as needed.    [provider]    Family History Family History   Problem Relation Age of Onset   Dementia Mother    Cancer Mother        skin   Osteoporosis Mother    Alzheimer's disease Father    Asthma Father    Hypertension Father    Breast cancer Maternal Aunt 81   Cancer Maternal Aunt        Breast Cancer   Hypertension Maternal Aunt    Birth defects Cousin 74       Breast   Breast cancer Cousin    Breast cancer Cousin    Hypertension Sister    Diabetes Paternal Grandmother    Breast cancer Maternal Grandfather     Social History Social History   Tobacco Use   Smoking status: Every Day    Packs/day: 0.25    Years: 52.00  Total pack years: 13.00    Types: Cigarettes   Smokeless tobacco: Never  Vaping Use   Vaping Use: Never used  Substance Use Topics   Alcohol use: No    Alcohol/week: 0.0 standard drinks of alcohol    Comment: rarely   Drug use: No     Allergies   Cyclobenzaprine, Penicillins, Adhesive [tape], and Atorvastatin   Review of Systems Review of Systems  Constitutional:  Positive for appetite change, fatigue and fever. Negative for chills and diaphoresis.  HENT:  Positive for congestion, postnasal drip, rhinorrhea and sneezing. Negative for ear pain, sinus pressure, sinus pain and sore throat.   Respiratory:  Positive for cough. Negative for shortness of breath.   Cardiovascular:  Positive for chest pain.  Gastrointestinal:  Positive for nausea. Negative for abdominal pain, diarrhea and vomiting.  Musculoskeletal:  Negative for arthralgias and myalgias.  Skin:  Negative for rash.  Neurological:  Negative for weakness and headaches.  Hematological:  Negative for adenopathy.     Physical Exam Triage Vital Signs ED Triage Vitals  Enc Vitals Group     BP 09/16/22 1408 131/73     Pulse Rate 09/16/22 1408 (!) 53     Resp 09/16/22 1408 16     Temp 09/16/22 1408 98.3 F (36.8 C)     Temp Source 09/16/22 1408 Oral     SpO2 09/16/22 1408 94 %     Weight 09/16/22 1406 149 lb 0.5 oz (67.6 kg)     Height  09/16/22 1406 '5\' 4"'$  (1.626 m)     Head Circumference --      Peak Flow --      Pain Score 09/16/22 1404 0     Pain Loc --      Pain Edu? --      Excl. in [place]? --    No data found.  Updated Vital Signs BP 131/73 (BP Location: Left Arm)   Pulse (!) 53   Temp 98.3 F (36.8 C) (Oral)   Resp 16   Ht '5\' 4"'$  (1.626 m)   Wt 149 lb 0.5 oz (67.6 kg)   SpO2 94%   BMI 25.58 kg/m     Physical Exam Vitals and nursing note reviewed.  Constitutional:      General: She is not in acute distress.    Appearance: Normal appearance. She is not ill-appearing or toxic-appearing.  HENT:     Head: Normocephalic and atraumatic.     Nose: Congestion present.     Mouth/Throat:     Mouth: Mucous membranes are moist.     Pharynx: Oropharynx is clear. Posterior oropharyngeal erythema present.  Eyes:     General: No scleral icterus.       Right eye: No discharge.        Left eye: No discharge.     Conjunctiva/sclera: Conjunctivae normal.  Cardiovascular:     Rate and Rhythm: Regular rhythm. Bradycardia present.     Heart sounds: Normal heart sounds.  Pulmonary:     Effort: Pulmonary effort is normal. No respiratory distress.     Breath sounds: Normal breath sounds.  Musculoskeletal:     Cervical back: Neck supple.  Skin:    General: Skin is dry.  Neurological:     General: No focal deficit present.     Mental Status: She is alert. Mental status is at baseline.     Motor: No weakness.     Gait: Gait normal.  Psychiatric:  Mood and Affect: Mood normal.        Behavior: Behavior normal.        Thought Content: Thought content normal.      UC Treatments / Results  Labs (all labs ordered are listed, but only abnormal results are displayed) Labs Reviewed - No data to display  EKG   Radiology DG Chest 2 View  Result Date: 09/16/2022 CLINICAL DATA:  Cough, congestion, and fever for 1 week. EXAM: CHEST - 2 VIEW COMPARISON:  Chest two views 01/15/2022 FINDINGS: Cardiac silhouette and  mediastinal contours are within limits. Moderate calcification within the aortic arch. Increased upper lung lucencies again compatible with chronic emphysematous changes. Question minimal blunting of the right costophrenic angle on frontal view, however no pleural effusion is seen on lateral view. No pleural effusion pneumothorax. Moderate multilevel degenerative disc changes of the thoracic spine. IMPRESSION: 1. Chronic emphysematous changes. 2. Question minimal blunting of the right costophrenic angle but no significant pleural effusion is seen on lateral view. 3. No acute airspace opacity is seen. Electronically Signed   By: Yvonne Kendall M.D.   On: 09/16/2022 14:46    Procedures Procedures (including critical care time)  Medications Ordered in UC Medications - No data to display  Initial Impression / Assessment and Plan / UC Course  I have reviewed the triage vital signs and the nursing notes.  Pertinent labs & imaging results that were available during my care of the patient were reviewed by me and considered in my medical decision making (see chart for details).   77 year old female presents for 1 week history of cough, congestion, sneezing.  Has been exposed to flu and had a fever until a couple days ago.  Also reports that her allergies are flared up since she has been around a dog.  Reports symptoms have improved since onset.  Vitals are all stable.  She does not have a fever.  She is not ill-appearing and in no acute distress.  She does have nasal congestion and mild posterior pharyngeal erythema with clear postnasal drainage.  Chest clear to auscultation and heart regular rhythm.    Chest x-ray performed today does not show any evidence of pneumonia.  There is evidence of chronic emphysematous changes.  I did discuss this with patient and advised her to try to stop smoking.  We discussed that this could only worsen if she does not stop smoking.  Suspect influenza and allergic rhinitis  as cause of symptoms.  Will treat patient at this time with Bromfed-DM.  Advised her to also use Flonase.  She is to return for reevaluation if she has return of the fever, sinus pain, shortness of breath, etc.   Final Clinical Impressions(s) / UC Diagnoses   Final diagnoses:  Influenza  Acute cough  Allergic rhinitis due to animal hair and dander  Tobacco abuse     Discharge Instructions      -You likely have the flu given your exposure and symptoms.  You do not have any evidence of pneumonia on your x-ray.  Your exam is reassuring. - You probably are also having increased congestion related to your allergies. - I have sent a medication to the pharmacy to help with congestion and cough.  Increase your rest and fluids.  If you develop another fever or have sinus pain or breathing difficulty, you should return for reevaluation. -Try to stop smoking.  There is evidence of emphysema which is a chronic lung disease on your x-ray.  ED Prescriptions     Medication Sig Dispense Auth. Provider   brompheniramine-pseudoephedrine-DM 30-2-10 MG/5ML syrup Take 10 mLs by mouth 4 (four) times daily as needed for up to 7 days. 150 mL Danton Clap, PA-C      PDMP not reviewed this encounter.   Danton Clap, PA-C 09/16/22 1524

## 2022-09-16 NOTE — ED Triage Notes (Signed)
Pt c/o cough, runny nose, sore throat, no appetite, nausea, fever(101.2) and chest pain. Started about 8 days ago. She states her sister has the same symptoms and was tested, positive for flu.

## 2022-09-20 ENCOUNTER — Telehealth: Payer: Self-pay | Admitting: Family Medicine

## 2022-09-20 MED ORDER — BENZONATATE 200 MG PO CAPS: 200.0000 mg | ORAL_CAPSULE | Freq: Three times a day (TID) | ORAL | 0 refills | Status: AC | PRN

## 2022-09-20 NOTE — Telephone Encounter (Signed)
Patient requesting alternative cough medication.  Sending in [place].  [place]

## 2022-10-30 DIAGNOSIS — F3341 Major depressive disorder, recurrent, in partial remission: Secondary | ICD-10-CM | POA: Diagnosis not present

## 2022-10-30 DIAGNOSIS — R809 Proteinuria, unspecified: Secondary | ICD-10-CM | POA: Diagnosis not present

## 2022-10-30 DIAGNOSIS — E7800 Pure hypercholesterolemia, unspecified: Secondary | ICD-10-CM | POA: Diagnosis not present

## 2022-10-30 DIAGNOSIS — E1129 Type 2 diabetes mellitus with other diabetic kidney complication: Secondary | ICD-10-CM | POA: Diagnosis not present

## 2022-12-09 DIAGNOSIS — R42 Dizziness and giddiness: Secondary | ICD-10-CM | POA: Diagnosis not present

## 2022-12-09 DIAGNOSIS — R809 Proteinuria, unspecified: Secondary | ICD-10-CM | POA: Diagnosis not present

## 2022-12-09 DIAGNOSIS — Z0000 Encounter for general adult medical examination without abnormal findings: Secondary | ICD-10-CM | POA: Diagnosis not present

## 2022-12-09 DIAGNOSIS — R413 Other amnesia: Secondary | ICD-10-CM | POA: Diagnosis not present

## 2022-12-09 DIAGNOSIS — R8290 Unspecified abnormal findings in urine: Secondary | ICD-10-CM | POA: Diagnosis not present

## 2022-12-09 DIAGNOSIS — R2681 Unsteadiness on feet: Secondary | ICD-10-CM | POA: Diagnosis not present

## 2022-12-09 DIAGNOSIS — J301 Allergic rhinitis due to pollen: Secondary | ICD-10-CM | POA: Diagnosis not present

## 2022-12-09 DIAGNOSIS — I714 Abdominal aortic aneurysm, without rupture, unspecified: Secondary | ICD-10-CM | POA: Diagnosis not present

## 2022-12-09 DIAGNOSIS — E7800 Pure hypercholesterolemia, unspecified: Secondary | ICD-10-CM | POA: Diagnosis not present

## 2022-12-09 DIAGNOSIS — F3341 Major depressive disorder, recurrent, in partial remission: Secondary | ICD-10-CM | POA: Diagnosis not present

## 2022-12-09 DIAGNOSIS — E871 Hypo-osmolality and hyponatremia: Secondary | ICD-10-CM | POA: Diagnosis not present

## 2022-12-09 DIAGNOSIS — E559 Vitamin D deficiency, unspecified: Secondary | ICD-10-CM | POA: Diagnosis not present

## 2022-12-09 DIAGNOSIS — K219 Gastro-esophageal reflux disease without esophagitis: Secondary | ICD-10-CM | POA: Diagnosis not present

## 2022-12-09 DIAGNOSIS — E1129 Type 2 diabetes mellitus with other diabetic kidney complication: Secondary | ICD-10-CM | POA: Diagnosis not present

## 2022-12-11 DIAGNOSIS — Z961 Presence of intraocular lens: Secondary | ICD-10-CM | POA: Diagnosis not present

## 2022-12-11 DIAGNOSIS — H43813 Vitreous degeneration, bilateral: Secondary | ICD-10-CM | POA: Diagnosis not present

## 2022-12-11 DIAGNOSIS — E119 Type 2 diabetes mellitus without complications: Secondary | ICD-10-CM | POA: Diagnosis not present

## 2023-01-07 DIAGNOSIS — E1129 Type 2 diabetes mellitus with other diabetic kidney complication: Secondary | ICD-10-CM | POA: Diagnosis not present

## 2023-01-07 DIAGNOSIS — R809 Proteinuria, unspecified: Secondary | ICD-10-CM | POA: Diagnosis not present

## 2023-01-07 DIAGNOSIS — E7800 Pure hypercholesterolemia, unspecified: Secondary | ICD-10-CM | POA: Diagnosis not present

## 2023-01-07 DIAGNOSIS — F3341 Major depressive disorder, recurrent, in partial remission: Secondary | ICD-10-CM | POA: Diagnosis not present

## 2023-02-11 DIAGNOSIS — E1129 Type 2 diabetes mellitus with other diabetic kidney complication: Secondary | ICD-10-CM | POA: Diagnosis not present

## 2023-02-11 DIAGNOSIS — F3341 Major depressive disorder, recurrent, in partial remission: Secondary | ICD-10-CM | POA: Diagnosis not present

## 2023-02-11 DIAGNOSIS — R809 Proteinuria, unspecified: Secondary | ICD-10-CM | POA: Diagnosis not present

## 2023-02-11 DIAGNOSIS — E7800 Pure hypercholesterolemia, unspecified: Secondary | ICD-10-CM | POA: Diagnosis not present

## 2023-02-17 ENCOUNTER — Ambulatory Visit: Admit: 2023-02-17 | Payer: PPO

## 2023-02-17 SURGERY — COLONOSCOPY WITH PROPOFOL
Anesthesia: General

## 2023-03-26 DIAGNOSIS — J3489 Other specified disorders of nose and nasal sinuses: Secondary | ICD-10-CM | POA: Diagnosis not present

## 2023-03-26 DIAGNOSIS — F05 Delirium due to known physiological condition: Secondary | ICD-10-CM | POA: Diagnosis not present

## 2023-03-26 DIAGNOSIS — Z9181 History of falling: Secondary | ICD-10-CM | POA: Diagnosis not present

## 2023-03-26 DIAGNOSIS — R410 Disorientation, unspecified: Secondary | ICD-10-CM | POA: Diagnosis not present

## 2023-03-26 DIAGNOSIS — S0990XA Unspecified injury of head, initial encounter: Secondary | ICD-10-CM | POA: Diagnosis not present

## 2023-03-26 DIAGNOSIS — I10 Essential (primary) hypertension: Secondary | ICD-10-CM | POA: Diagnosis not present

## 2023-03-26 DIAGNOSIS — N390 Urinary tract infection, site not specified: Secondary | ICD-10-CM | POA: Diagnosis not present

## 2023-03-26 DIAGNOSIS — R4182 Altered mental status, unspecified: Secondary | ICD-10-CM | POA: Diagnosis not present

## 2023-03-26 DIAGNOSIS — Z043 Encounter for examination and observation following other accident: Secondary | ICD-10-CM | POA: Diagnosis not present

## 2023-03-26 DIAGNOSIS — M5030 Other cervical disc degeneration, unspecified cervical region: Secondary | ICD-10-CM | POA: Diagnosis not present

## 2023-03-27 DIAGNOSIS — I6522 Occlusion and stenosis of left carotid artery: Secondary | ICD-10-CM | POA: Diagnosis not present

## 2023-03-27 DIAGNOSIS — R4182 Altered mental status, unspecified: Secondary | ICD-10-CM | POA: Diagnosis not present

## 2023-03-27 DIAGNOSIS — R8271 Bacteriuria: Secondary | ICD-10-CM | POA: Diagnosis not present

## 2023-03-27 DIAGNOSIS — E871 Hypo-osmolality and hyponatremia: Secondary | ICD-10-CM | POA: Diagnosis not present

## 2023-03-27 DIAGNOSIS — G930 Cerebral cysts: Secondary | ICD-10-CM | POA: Diagnosis not present

## 2023-03-27 DIAGNOSIS — W01198A Fall on same level from slipping, tripping and stumbling with subsequent striking against other object, initial encounter: Secondary | ICD-10-CM | POA: Diagnosis not present

## 2023-03-27 DIAGNOSIS — R2689 Other abnormalities of gait and mobility: Secondary | ICD-10-CM | POA: Diagnosis not present

## 2023-03-27 DIAGNOSIS — I10 Essential (primary) hypertension: Secondary | ICD-10-CM | POA: Diagnosis not present

## 2023-03-28 DIAGNOSIS — F32A Depression, unspecified: Secondary | ICD-10-CM | POA: Diagnosis not present

## 2023-03-28 DIAGNOSIS — R5381 Other malaise: Secondary | ICD-10-CM | POA: Diagnosis not present

## 2023-03-28 DIAGNOSIS — I6522 Occlusion and stenosis of left carotid artery: Secondary | ICD-10-CM | POA: Diagnosis not present

## 2023-03-28 DIAGNOSIS — K219 Gastro-esophageal reflux disease without esophagitis: Secondary | ICD-10-CM | POA: Diagnosis not present

## 2023-03-28 DIAGNOSIS — I6523 Occlusion and stenosis of bilateral carotid arteries: Secondary | ICD-10-CM | POA: Diagnosis not present

## 2023-03-28 DIAGNOSIS — F0390 Unspecified dementia without behavioral disturbance: Secondary | ICD-10-CM | POA: Diagnosis not present

## 2023-03-28 DIAGNOSIS — E785 Hyperlipidemia, unspecified: Secondary | ICD-10-CM | POA: Diagnosis not present

## 2023-03-28 DIAGNOSIS — I671 Cerebral aneurysm, nonruptured: Secondary | ICD-10-CM | POA: Diagnosis not present

## 2023-03-29 DIAGNOSIS — E785 Hyperlipidemia, unspecified: Secondary | ICD-10-CM | POA: Diagnosis not present

## 2023-03-29 DIAGNOSIS — I10 Essential (primary) hypertension: Secondary | ICD-10-CM | POA: Diagnosis not present

## 2023-03-29 DIAGNOSIS — E871 Hypo-osmolality and hyponatremia: Secondary | ICD-10-CM | POA: Diagnosis not present

## 2023-03-29 DIAGNOSIS — F419 Anxiety disorder, unspecified: Secondary | ICD-10-CM | POA: Diagnosis not present

## 2023-03-29 DIAGNOSIS — I5189 Other ill-defined heart diseases: Secondary | ICD-10-CM | POA: Diagnosis not present

## 2023-03-29 DIAGNOSIS — Z880 Allergy status to penicillin: Secondary | ICD-10-CM | POA: Diagnosis not present

## 2023-03-29 DIAGNOSIS — I499 Cardiac arrhythmia, unspecified: Secondary | ICD-10-CM | POA: Diagnosis not present

## 2023-03-29 DIAGNOSIS — R2689 Other abnormalities of gait and mobility: Secondary | ICD-10-CM | POA: Diagnosis not present

## 2023-03-29 DIAGNOSIS — E119 Type 2 diabetes mellitus without complications: Secondary | ICD-10-CM | POA: Diagnosis not present

## 2023-03-29 DIAGNOSIS — I6782 Cerebral ischemia: Secondary | ICD-10-CM | POA: Diagnosis not present

## 2023-03-29 DIAGNOSIS — S0990XA Unspecified injury of head, initial encounter: Secondary | ICD-10-CM | POA: Diagnosis not present

## 2023-03-29 DIAGNOSIS — I714 Abdominal aortic aneurysm, without rupture, unspecified: Secondary | ICD-10-CM | POA: Diagnosis not present

## 2023-03-29 DIAGNOSIS — B9620 Unspecified Escherichia coli [E. coli] as the cause of diseases classified elsewhere: Secondary | ICD-10-CM | POA: Diagnosis not present

## 2023-03-29 DIAGNOSIS — K219 Gastro-esophageal reflux disease without esophagitis: Secondary | ICD-10-CM | POA: Diagnosis not present

## 2023-03-29 DIAGNOSIS — F0390 Unspecified dementia without behavioral disturbance: Secondary | ICD-10-CM | POA: Diagnosis not present

## 2023-03-29 DIAGNOSIS — R8271 Bacteriuria: Secondary | ICD-10-CM | POA: Diagnosis not present

## 2023-03-29 DIAGNOSIS — G930 Cerebral cysts: Secondary | ICD-10-CM | POA: Diagnosis not present

## 2023-03-29 DIAGNOSIS — R5381 Other malaise: Secondary | ICD-10-CM | POA: Diagnosis not present

## 2023-03-29 DIAGNOSIS — I6522 Occlusion and stenosis of left carotid artery: Secondary | ICD-10-CM | POA: Diagnosis not present

## 2023-03-29 DIAGNOSIS — F32A Depression, unspecified: Secondary | ICD-10-CM | POA: Diagnosis not present

## 2023-03-29 DIAGNOSIS — N390 Urinary tract infection, site not specified: Secondary | ICD-10-CM | POA: Diagnosis not present

## 2023-03-29 DIAGNOSIS — R55 Syncope and collapse: Secondary | ICD-10-CM | POA: Diagnosis not present

## 2023-03-29 DIAGNOSIS — R269 Unspecified abnormalities of gait and mobility: Secondary | ICD-10-CM | POA: Diagnosis not present

## 2023-03-29 DIAGNOSIS — F17210 Nicotine dependence, cigarettes, uncomplicated: Secondary | ICD-10-CM | POA: Diagnosis not present

## 2023-04-03 DIAGNOSIS — I6522 Occlusion and stenosis of left carotid artery: Secondary | ICD-10-CM | POA: Diagnosis not present

## 2023-04-07 DIAGNOSIS — W19XXXD Unspecified fall, subsequent encounter: Secondary | ICD-10-CM | POA: Diagnosis not present

## 2023-04-07 DIAGNOSIS — I6522 Occlusion and stenosis of left carotid artery: Secondary | ICD-10-CM | POA: Diagnosis not present

## 2023-04-07 DIAGNOSIS — E119 Type 2 diabetes mellitus without complications: Secondary | ICD-10-CM | POA: Diagnosis not present

## 2023-04-07 DIAGNOSIS — Z9181 History of falling: Secondary | ICD-10-CM | POA: Diagnosis not present

## 2023-04-07 DIAGNOSIS — K219 Gastro-esophageal reflux disease without esophagitis: Secondary | ICD-10-CM | POA: Diagnosis not present

## 2023-04-07 DIAGNOSIS — G930 Cerebral cysts: Secondary | ICD-10-CM | POA: Diagnosis not present

## 2023-04-07 DIAGNOSIS — I10 Essential (primary) hypertension: Secondary | ICD-10-CM | POA: Diagnosis not present

## 2023-04-07 DIAGNOSIS — E785 Hyperlipidemia, unspecified: Secondary | ICD-10-CM | POA: Diagnosis not present

## 2023-04-07 DIAGNOSIS — F0394 Unspecified dementia, unspecified severity, with anxiety: Secondary | ICD-10-CM | POA: Diagnosis not present

## 2023-04-07 DIAGNOSIS — F0393 Unspecified dementia, unspecified severity, with mood disturbance: Secondary | ICD-10-CM | POA: Diagnosis not present

## 2023-04-07 DIAGNOSIS — Z7982 Long term (current) use of aspirin: Secondary | ICD-10-CM | POA: Diagnosis not present

## 2023-04-07 DIAGNOSIS — Z602 Problems related to living alone: Secondary | ICD-10-CM | POA: Diagnosis not present

## 2023-04-07 DIAGNOSIS — E871 Hypo-osmolality and hyponatremia: Secondary | ICD-10-CM | POA: Diagnosis not present

## 2023-04-07 DIAGNOSIS — I714 Abdominal aortic aneurysm, without rupture, unspecified: Secondary | ICD-10-CM | POA: Diagnosis not present

## 2023-04-15 DIAGNOSIS — E7800 Pure hypercholesterolemia, unspecified: Secondary | ICD-10-CM | POA: Diagnosis not present

## 2023-04-15 DIAGNOSIS — R809 Proteinuria, unspecified: Secondary | ICD-10-CM | POA: Diagnosis not present

## 2023-04-15 DIAGNOSIS — E1129 Type 2 diabetes mellitus with other diabetic kidney complication: Secondary | ICD-10-CM | POA: Diagnosis not present

## 2023-04-15 DIAGNOSIS — F3341 Major depressive disorder, recurrent, in partial remission: Secondary | ICD-10-CM | POA: Diagnosis not present

## 2023-06-11 ENCOUNTER — Other Ambulatory Visit: Payer: Self-pay | Admitting: Family Medicine

## 2023-06-11 DIAGNOSIS — Z1231 Encounter for screening mammogram for malignant neoplasm of breast: Secondary | ICD-10-CM

## 2023-06-12 DIAGNOSIS — E1129 Type 2 diabetes mellitus with other diabetic kidney complication: Secondary | ICD-10-CM | POA: Diagnosis not present

## 2023-06-12 DIAGNOSIS — J301 Allergic rhinitis due to pollen: Secondary | ICD-10-CM | POA: Diagnosis not present

## 2023-06-12 DIAGNOSIS — E871 Hypo-osmolality and hyponatremia: Secondary | ICD-10-CM | POA: Diagnosis not present

## 2023-06-12 DIAGNOSIS — R809 Proteinuria, unspecified: Secondary | ICD-10-CM | POA: Diagnosis not present

## 2023-06-12 DIAGNOSIS — E7800 Pure hypercholesterolemia, unspecified: Secondary | ICD-10-CM | POA: Diagnosis not present

## 2023-06-12 DIAGNOSIS — E559 Vitamin D deficiency, unspecified: Secondary | ICD-10-CM | POA: Diagnosis not present

## 2023-06-12 DIAGNOSIS — R42 Dizziness and giddiness: Secondary | ICD-10-CM | POA: Diagnosis not present

## 2023-06-12 DIAGNOSIS — R413 Other amnesia: Secondary | ICD-10-CM | POA: Diagnosis not present

## 2023-06-12 DIAGNOSIS — F3341 Major depressive disorder, recurrent, in partial remission: Secondary | ICD-10-CM | POA: Diagnosis not present

## 2023-06-12 DIAGNOSIS — F17209 Nicotine dependence, unspecified, with unspecified nicotine-induced disorders: Secondary | ICD-10-CM | POA: Diagnosis not present

## 2023-06-12 DIAGNOSIS — R2681 Unsteadiness on feet: Secondary | ICD-10-CM | POA: Diagnosis not present

## 2023-06-12 DIAGNOSIS — K5900 Constipation, unspecified: Secondary | ICD-10-CM | POA: Diagnosis not present

## 2023-06-12 DIAGNOSIS — I714 Abdominal aortic aneurysm, without rupture, unspecified: Secondary | ICD-10-CM | POA: Diagnosis not present

## 2023-07-16 DIAGNOSIS — R809 Proteinuria, unspecified: Secondary | ICD-10-CM | POA: Diagnosis not present

## 2023-07-16 DIAGNOSIS — F3341 Major depressive disorder, recurrent, in partial remission: Secondary | ICD-10-CM | POA: Diagnosis not present

## 2023-07-16 DIAGNOSIS — E7800 Pure hypercholesterolemia, unspecified: Secondary | ICD-10-CM | POA: Diagnosis not present

## 2023-07-16 DIAGNOSIS — E1129 Type 2 diabetes mellitus with other diabetic kidney complication: Secondary | ICD-10-CM | POA: Diagnosis not present

## 2023-07-23 ENCOUNTER — Ambulatory Visit
Admission: RE | Admit: 2023-07-23 | Discharge: 2023-07-23 | Disposition: A | Discharge status: Home / Self Care | Payer: PPO | Source: Ambulatory Visit | Attending: Family Medicine | Admitting: Family Medicine

## 2023-07-23 DIAGNOSIS — Z1231 Encounter for screening mammogram for malignant neoplasm of breast: Secondary | ICD-10-CM | POA: Insufficient documentation

## 2023-09-02 DIAGNOSIS — R454 Irritability and anger: Secondary | ICD-10-CM | POA: Diagnosis not present

## 2023-09-02 DIAGNOSIS — R413 Other amnesia: Secondary | ICD-10-CM | POA: Diagnosis not present

## 2023-09-02 DIAGNOSIS — G479 Sleep disorder, unspecified: Secondary | ICD-10-CM | POA: Diagnosis not present

## 2023-11-12 DIAGNOSIS — Z961 Presence of intraocular lens: Secondary | ICD-10-CM | POA: Diagnosis not present

## 2023-12-10 DIAGNOSIS — R2681 Unsteadiness on feet: Secondary | ICD-10-CM | POA: Diagnosis not present

## 2023-12-10 DIAGNOSIS — E559 Vitamin D deficiency, unspecified: Secondary | ICD-10-CM | POA: Diagnosis not present

## 2023-12-10 DIAGNOSIS — R809 Proteinuria, unspecified: Secondary | ICD-10-CM | POA: Diagnosis not present

## 2023-12-10 DIAGNOSIS — E871 Hypo-osmolality and hyponatremia: Secondary | ICD-10-CM | POA: Diagnosis not present

## 2023-12-10 DIAGNOSIS — R42 Dizziness and giddiness: Secondary | ICD-10-CM | POA: Diagnosis not present

## 2023-12-10 DIAGNOSIS — F17209 Nicotine dependence, unspecified, with unspecified nicotine-induced disorders: Secondary | ICD-10-CM | POA: Diagnosis not present

## 2023-12-10 DIAGNOSIS — F03A0 Unspecified dementia, mild, without behavioral disturbance, psychotic disturbance, mood disturbance, and anxiety: Secondary | ICD-10-CM | POA: Diagnosis not present

## 2023-12-10 DIAGNOSIS — E7800 Pure hypercholesterolemia, unspecified: Secondary | ICD-10-CM | POA: Diagnosis not present

## 2023-12-10 DIAGNOSIS — Z0000 Encounter for general adult medical examination without abnormal findings: Secondary | ICD-10-CM | POA: Diagnosis not present

## 2023-12-10 DIAGNOSIS — I714 Abdominal aortic aneurysm, without rupture, unspecified: Secondary | ICD-10-CM | POA: Diagnosis not present

## 2023-12-10 DIAGNOSIS — F3341 Major depressive disorder, recurrent, in partial remission: Secondary | ICD-10-CM | POA: Diagnosis not present

## 2023-12-10 DIAGNOSIS — E1129 Type 2 diabetes mellitus with other diabetic kidney complication: Secondary | ICD-10-CM | POA: Diagnosis not present

## 2023-12-14 DIAGNOSIS — R454 Irritability and anger: Secondary | ICD-10-CM | POA: Diagnosis not present

## 2023-12-14 DIAGNOSIS — R413 Other amnesia: Secondary | ICD-10-CM | POA: Diagnosis not present

## 2023-12-14 DIAGNOSIS — G479 Sleep disorder, unspecified: Secondary | ICD-10-CM | POA: Diagnosis not present

## 2023-12-17 DIAGNOSIS — E559 Vitamin D deficiency, unspecified: Secondary | ICD-10-CM | POA: Diagnosis not present

## 2023-12-17 DIAGNOSIS — E1129 Type 2 diabetes mellitus with other diabetic kidney complication: Secondary | ICD-10-CM | POA: Diagnosis not present

## 2023-12-17 DIAGNOSIS — R809 Proteinuria, unspecified: Secondary | ICD-10-CM | POA: Diagnosis not present

## 2023-12-17 DIAGNOSIS — E7800 Pure hypercholesterolemia, unspecified: Secondary | ICD-10-CM | POA: Diagnosis not present

## 2024-02-15 DIAGNOSIS — R413 Other amnesia: Secondary | ICD-10-CM | POA: Diagnosis not present

## 2024-02-15 DIAGNOSIS — R454 Irritability and anger: Secondary | ICD-10-CM | POA: Diagnosis not present

## 2024-02-15 DIAGNOSIS — G479 Sleep disorder, unspecified: Secondary | ICD-10-CM | POA: Diagnosis not present

## 2024-03-07 DIAGNOSIS — F3341 Major depressive disorder, recurrent, in partial remission: Secondary | ICD-10-CM | POA: Diagnosis not present

## 2024-03-07 DIAGNOSIS — E7800 Pure hypercholesterolemia, unspecified: Secondary | ICD-10-CM | POA: Diagnosis not present

## 2024-03-07 DIAGNOSIS — E1129 Type 2 diabetes mellitus with other diabetic kidney complication: Secondary | ICD-10-CM | POA: Diagnosis not present

## 2024-03-07 DIAGNOSIS — F03A0 Unspecified dementia, mild, without behavioral disturbance, psychotic disturbance, mood disturbance, and anxiety: Secondary | ICD-10-CM | POA: Diagnosis not present

## 2024-04-28 DIAGNOSIS — Z1331 Encounter for screening for depression: Secondary | ICD-10-CM | POA: Diagnosis not present

## 2024-04-28 DIAGNOSIS — E559 Vitamin D deficiency, unspecified: Secondary | ICD-10-CM | POA: Diagnosis not present

## 2024-04-28 DIAGNOSIS — R42 Dizziness and giddiness: Secondary | ICD-10-CM | POA: Diagnosis not present

## 2024-04-28 DIAGNOSIS — F3341 Major depressive disorder, recurrent, in partial remission: Secondary | ICD-10-CM | POA: Diagnosis not present

## 2024-04-28 DIAGNOSIS — E1129 Type 2 diabetes mellitus with other diabetic kidney complication: Secondary | ICD-10-CM | POA: Diagnosis not present

## 2024-04-28 DIAGNOSIS — E7800 Pure hypercholesterolemia, unspecified: Secondary | ICD-10-CM | POA: Diagnosis not present

## 2024-04-28 DIAGNOSIS — F03A0 Unspecified dementia, mild, without behavioral disturbance, psychotic disturbance, mood disturbance, and anxiety: Secondary | ICD-10-CM | POA: Diagnosis not present

## 2024-04-28 DIAGNOSIS — R809 Proteinuria, unspecified: Secondary | ICD-10-CM | POA: Diagnosis not present

## 2024-05-02 DIAGNOSIS — G928 Other toxic encephalopathy: Secondary | ICD-10-CM | POA: Diagnosis not present

## 2024-05-02 DIAGNOSIS — F419 Anxiety disorder, unspecified: Secondary | ICD-10-CM | POA: Diagnosis not present

## 2024-05-02 DIAGNOSIS — I714 Abdominal aortic aneurysm, without rupture, unspecified: Secondary | ICD-10-CM | POA: Diagnosis not present

## 2024-05-02 DIAGNOSIS — N3001 Acute cystitis with hematuria: Secondary | ICD-10-CM | POA: Diagnosis not present

## 2024-05-02 DIAGNOSIS — F0390 Unspecified dementia without behavioral disturbance: Secondary | ICD-10-CM | POA: Diagnosis not present

## 2024-05-02 DIAGNOSIS — R7989 Other specified abnormal findings of blood chemistry: Secondary | ICD-10-CM | POA: Diagnosis not present

## 2024-05-02 DIAGNOSIS — R410 Disorientation, unspecified: Secondary | ICD-10-CM | POA: Diagnosis not present

## 2024-05-02 DIAGNOSIS — G929 Unspecified toxic encephalopathy: Secondary | ICD-10-CM | POA: Diagnosis not present

## 2024-05-02 DIAGNOSIS — I6522 Occlusion and stenosis of left carotid artery: Secondary | ICD-10-CM | POA: Diagnosis not present

## 2024-05-02 DIAGNOSIS — K219 Gastro-esophageal reflux disease without esophagitis: Secondary | ICD-10-CM | POA: Diagnosis not present

## 2024-05-02 DIAGNOSIS — R262 Difficulty in walking, not elsewhere classified: Secondary | ICD-10-CM | POA: Diagnosis not present

## 2024-05-02 DIAGNOSIS — R5383 Other fatigue: Secondary | ICD-10-CM | POA: Diagnosis not present

## 2024-05-02 DIAGNOSIS — N3000 Acute cystitis without hematuria: Secondary | ICD-10-CM | POA: Diagnosis not present

## 2024-05-02 DIAGNOSIS — E876 Hypokalemia: Secondary | ICD-10-CM | POA: Diagnosis not present

## 2024-05-02 DIAGNOSIS — M25551 Pain in right hip: Secondary | ICD-10-CM | POA: Diagnosis not present

## 2024-05-02 DIAGNOSIS — R339 Retention of urine, unspecified: Secondary | ICD-10-CM | POA: Diagnosis not present

## 2024-05-02 DIAGNOSIS — R918 Other nonspecific abnormal finding of lung field: Secondary | ICD-10-CM | POA: Diagnosis not present

## 2024-05-02 DIAGNOSIS — E119 Type 2 diabetes mellitus without complications: Secondary | ICD-10-CM | POA: Diagnosis not present

## 2024-05-02 DIAGNOSIS — F0150 Vascular dementia without behavioral disturbance: Secondary | ICD-10-CM | POA: Diagnosis not present

## 2024-05-02 DIAGNOSIS — I160 Hypertensive urgency: Secondary | ICD-10-CM | POA: Diagnosis not present

## 2024-05-02 DIAGNOSIS — I10 Essential (primary) hypertension: Secondary | ICD-10-CM | POA: Diagnosis not present

## 2024-05-02 DIAGNOSIS — Z66 Do not resuscitate: Secondary | ICD-10-CM | POA: Diagnosis not present

## 2024-05-02 DIAGNOSIS — Z1623 Resistance to quinolones and fluoroquinolones: Secondary | ICD-10-CM | POA: Diagnosis not present

## 2024-05-02 DIAGNOSIS — N390 Urinary tract infection, site not specified: Secondary | ICD-10-CM | POA: Diagnosis not present

## 2024-05-02 DIAGNOSIS — K5909 Other constipation: Secondary | ICD-10-CM | POA: Diagnosis not present

## 2024-05-02 DIAGNOSIS — M25552 Pain in left hip: Secondary | ICD-10-CM | POA: Diagnosis not present

## 2024-05-02 DIAGNOSIS — I2489 Other forms of acute ischemic heart disease: Secondary | ICD-10-CM | POA: Diagnosis not present

## 2024-05-02 DIAGNOSIS — E860 Dehydration: Secondary | ICD-10-CM | POA: Diagnosis not present

## 2024-05-02 DIAGNOSIS — G9341 Metabolic encephalopathy: Secondary | ICD-10-CM | POA: Diagnosis not present

## 2024-05-02 DIAGNOSIS — I161 Hypertensive emergency: Secondary | ICD-10-CM | POA: Diagnosis not present

## 2024-05-02 DIAGNOSIS — B9620 Unspecified Escherichia coli [E. coli] as the cause of diseases classified elsewhere: Secondary | ICD-10-CM | POA: Diagnosis not present

## 2024-05-02 DIAGNOSIS — R001 Bradycardia, unspecified: Secondary | ICD-10-CM | POA: Diagnosis not present

## 2024-05-02 DIAGNOSIS — R627 Adult failure to thrive: Secondary | ICD-10-CM | POA: Diagnosis not present

## 2024-05-02 DIAGNOSIS — E782 Mixed hyperlipidemia: Secondary | ICD-10-CM | POA: Diagnosis not present

## 2024-05-02 DIAGNOSIS — E871 Hypo-osmolality and hyponatremia: Secondary | ICD-10-CM | POA: Diagnosis not present

## 2024-05-02 DIAGNOSIS — I1A0 Resistant hypertension: Secondary | ICD-10-CM | POA: Diagnosis not present

## 2024-05-02 DIAGNOSIS — F17210 Nicotine dependence, cigarettes, uncomplicated: Secondary | ICD-10-CM | POA: Diagnosis not present

## 2024-05-02 DIAGNOSIS — F01A11 Vascular dementia, mild, with agitation: Secondary | ICD-10-CM | POA: Diagnosis not present

## 2024-05-02 DIAGNOSIS — E279 Disorder of adrenal gland, unspecified: Secondary | ICD-10-CM | POA: Diagnosis not present

## 2024-05-02 DIAGNOSIS — K5900 Constipation, unspecified: Secondary | ICD-10-CM | POA: Diagnosis not present

## 2024-05-23 DIAGNOSIS — F17200 Nicotine dependence, unspecified, uncomplicated: Secondary | ICD-10-CM | POA: Diagnosis not present

## 2024-05-23 DIAGNOSIS — G9341 Metabolic encephalopathy: Secondary | ICD-10-CM | POA: Diagnosis not present

## 2024-05-23 DIAGNOSIS — M19012 Primary osteoarthritis, left shoulder: Secondary | ICD-10-CM | POA: Diagnosis not present

## 2024-05-23 DIAGNOSIS — M19011 Primary osteoarthritis, right shoulder: Secondary | ICD-10-CM | POA: Diagnosis not present

## 2024-05-23 DIAGNOSIS — F0150 Vascular dementia without behavioral disturbance: Secondary | ICD-10-CM | POA: Diagnosis not present

## 2024-05-23 DIAGNOSIS — M8580 Other specified disorders of bone density and structure, unspecified site: Secondary | ICD-10-CM | POA: Diagnosis not present

## 2024-05-23 DIAGNOSIS — F05 Delirium due to known physiological condition: Secondary | ICD-10-CM | POA: Diagnosis not present

## 2024-05-23 DIAGNOSIS — I10 Essential (primary) hypertension: Secondary | ICD-10-CM | POA: Diagnosis not present

## 2024-05-23 DIAGNOSIS — F01511 Vascular dementia, unspecified severity, with agitation: Secondary | ICD-10-CM | POA: Diagnosis not present

## 2024-05-23 DIAGNOSIS — Z7982 Long term (current) use of aspirin: Secondary | ICD-10-CM | POA: Diagnosis not present

## 2024-05-23 DIAGNOSIS — Z556 Problems related to health literacy: Secondary | ICD-10-CM | POA: Diagnosis not present

## 2024-05-23 DIAGNOSIS — M160 Bilateral primary osteoarthritis of hip: Secondary | ICD-10-CM | POA: Diagnosis not present

## 2024-05-23 DIAGNOSIS — F0154 Vascular dementia, unspecified severity, with anxiety: Secondary | ICD-10-CM | POA: Diagnosis not present

## 2024-05-23 DIAGNOSIS — F01518 Vascular dementia, unspecified severity, with other behavioral disturbance: Secondary | ICD-10-CM | POA: Diagnosis not present

## 2024-05-23 DIAGNOSIS — F32A Depression, unspecified: Secondary | ICD-10-CM | POA: Diagnosis not present

## 2024-05-23 DIAGNOSIS — E119 Type 2 diabetes mellitus without complications: Secondary | ICD-10-CM | POA: Diagnosis not present

## 2024-05-23 DIAGNOSIS — Z8673 Personal history of transient ischemic attack (TIA), and cerebral infarction without residual deficits: Secondary | ICD-10-CM | POA: Diagnosis not present

## 2024-05-23 DIAGNOSIS — I160 Hypertensive urgency: Secondary | ICD-10-CM | POA: Diagnosis not present

## 2024-05-23 DIAGNOSIS — F0153 Vascular dementia, unspecified severity, with mood disturbance: Secondary | ICD-10-CM | POA: Diagnosis not present

## 2024-05-23 DIAGNOSIS — Z87440 Personal history of urinary (tract) infections: Secondary | ICD-10-CM | POA: Diagnosis not present

## 2024-05-23 DIAGNOSIS — N3000 Acute cystitis without hematuria: Secondary | ICD-10-CM | POA: Diagnosis not present

## 2024-05-23 DIAGNOSIS — Z9181 History of falling: Secondary | ICD-10-CM | POA: Diagnosis not present

## 2024-05-23 DIAGNOSIS — K5900 Constipation, unspecified: Secondary | ICD-10-CM | POA: Diagnosis not present

## 2024-05-23 DIAGNOSIS — E7800 Pure hypercholesterolemia, unspecified: Secondary | ICD-10-CM | POA: Diagnosis not present

## 2024-05-23 DIAGNOSIS — J9811 Atelectasis: Secondary | ICD-10-CM | POA: Diagnosis not present

## 2024-05-23 DIAGNOSIS — I714 Abdominal aortic aneurysm, without rupture, unspecified: Secondary | ICD-10-CM | POA: Diagnosis not present

## 2024-05-25 DIAGNOSIS — J301 Allergic rhinitis due to pollen: Secondary | ICD-10-CM | POA: Diagnosis not present

## 2024-05-25 DIAGNOSIS — F03A0 Unspecified dementia, mild, without behavioral disturbance, psychotic disturbance, mood disturbance, and anxiety: Secondary | ICD-10-CM | POA: Diagnosis not present

## 2024-05-25 DIAGNOSIS — I10 Essential (primary) hypertension: Secondary | ICD-10-CM | POA: Diagnosis not present

## 2024-05-25 DIAGNOSIS — E1129 Type 2 diabetes mellitus with other diabetic kidney complication: Secondary | ICD-10-CM | POA: Diagnosis not present

## 2024-05-25 DIAGNOSIS — F3341 Major depressive disorder, recurrent, in partial remission: Secondary | ICD-10-CM | POA: Diagnosis not present

## 2024-05-25 DIAGNOSIS — E559 Vitamin D deficiency, unspecified: Secondary | ICD-10-CM | POA: Diagnosis not present

## 2024-05-25 DIAGNOSIS — N3000 Acute cystitis without hematuria: Secondary | ICD-10-CM | POA: Diagnosis not present

## 2024-05-25 DIAGNOSIS — Z1331 Encounter for screening for depression: Secondary | ICD-10-CM | POA: Diagnosis not present

## 2024-05-25 DIAGNOSIS — R809 Proteinuria, unspecified: Secondary | ICD-10-CM | POA: Diagnosis not present

## 2024-05-25 DIAGNOSIS — E7800 Pure hypercholesterolemia, unspecified: Secondary | ICD-10-CM | POA: Diagnosis not present

## 2024-06-03 DIAGNOSIS — E7800 Pure hypercholesterolemia, unspecified: Secondary | ICD-10-CM | POA: Diagnosis not present

## 2024-06-03 DIAGNOSIS — F03A0 Unspecified dementia, mild, without behavioral disturbance, psychotic disturbance, mood disturbance, and anxiety: Secondary | ICD-10-CM | POA: Diagnosis not present

## 2024-06-03 DIAGNOSIS — R809 Proteinuria, unspecified: Secondary | ICD-10-CM | POA: Diagnosis not present

## 2024-06-03 DIAGNOSIS — I10 Essential (primary) hypertension: Secondary | ICD-10-CM | POA: Diagnosis not present

## 2024-06-03 DIAGNOSIS — F3341 Major depressive disorder, recurrent, in partial remission: Secondary | ICD-10-CM | POA: Diagnosis not present

## 2024-06-03 DIAGNOSIS — E1129 Type 2 diabetes mellitus with other diabetic kidney complication: Secondary | ICD-10-CM | POA: Diagnosis not present

## 2024-08-10 DIAGNOSIS — I10 Essential (primary) hypertension: Secondary | ICD-10-CM | POA: Diagnosis not present

## 2024-08-10 DIAGNOSIS — E7800 Pure hypercholesterolemia, unspecified: Secondary | ICD-10-CM | POA: Diagnosis not present

## 2024-08-10 DIAGNOSIS — F03A0 Unspecified dementia, mild, without behavioral disturbance, psychotic disturbance, mood disturbance, and anxiety: Secondary | ICD-10-CM | POA: Diagnosis not present

## 2024-08-10 DIAGNOSIS — J301 Allergic rhinitis due to pollen: Secondary | ICD-10-CM | POA: Diagnosis not present

## 2024-08-10 DIAGNOSIS — Z1331 Encounter for screening for depression: Secondary | ICD-10-CM | POA: Diagnosis not present

## 2024-08-10 DIAGNOSIS — Z87440 Personal history of urinary (tract) infections: Secondary | ICD-10-CM | POA: Diagnosis not present
